# Patient Record
Sex: Female | Born: 1980 | Race: White | Hispanic: No | Marital: Married | Smoking: Never smoker
Health system: Southern US, Community
[De-identification: ages and names within clinical notes are randomized; demographics above are authoritative.]

## PROBLEM LIST (undated history)

## (undated) DIAGNOSIS — Z8719 Personal history of other diseases of the digestive system: Secondary | ICD-10-CM

## (undated) DIAGNOSIS — G43909 Migraine, unspecified, not intractable, without status migrainosus: Secondary | ICD-10-CM

## (undated) DIAGNOSIS — R112 Nausea with vomiting, unspecified: Secondary | ICD-10-CM

## (undated) DIAGNOSIS — I10 Essential (primary) hypertension: Secondary | ICD-10-CM

## (undated) DIAGNOSIS — R2231 Localized swelling, mass and lump, right upper limb: Secondary | ICD-10-CM

## (undated) DIAGNOSIS — Z9889 Other specified postprocedural states: Secondary | ICD-10-CM

## (undated) DIAGNOSIS — K219 Gastro-esophageal reflux disease without esophagitis: Secondary | ICD-10-CM

## (undated) HISTORY — PX: FINGER MASS EXCISION: SHX1638

## (undated) HISTORY — PX: TUBAL LIGATION: SHX77

---

## 2000-10-25 ENCOUNTER — Emergency Department (HOSPITAL_COMMUNITY): Admission: EM | Admit: 2000-10-25 | Discharge: 2000-10-25 | Payer: Self-pay | Admitting: Emergency Medicine

## 2001-12-09 ENCOUNTER — Emergency Department (HOSPITAL_COMMUNITY): Admission: EM | Admit: 2001-12-09 | Discharge: 2001-12-09 | Payer: Self-pay | Admitting: Emergency Medicine

## 2001-12-09 ENCOUNTER — Encounter: Payer: Self-pay | Admitting: Emergency Medicine

## 2002-08-13 ENCOUNTER — Emergency Department (HOSPITAL_COMMUNITY): Admission: EM | Admit: 2002-08-13 | Discharge: 2002-08-13 | Payer: Self-pay | Admitting: Emergency Medicine

## 2002-09-24 ENCOUNTER — Ambulatory Visit (HOSPITAL_COMMUNITY): Admission: AD | Admit: 2002-09-24 | Discharge: 2002-09-25 | Payer: Self-pay | Admitting: Obstetrics and Gynecology

## 2002-10-31 ENCOUNTER — Emergency Department (HOSPITAL_COMMUNITY): Admission: EM | Admit: 2002-10-31 | Discharge: 2002-10-31 | Payer: Self-pay | Admitting: Emergency Medicine

## 2002-11-06 ENCOUNTER — Observation Stay (HOSPITAL_COMMUNITY): Admission: EM | Admit: 2002-11-06 | Discharge: 2002-11-07 | Payer: Self-pay

## 2002-12-21 ENCOUNTER — Ambulatory Visit (HOSPITAL_COMMUNITY): Admission: AD | Admit: 2002-12-21 | Discharge: 2002-12-21 | Payer: Self-pay | Admitting: Obstetrics and Gynecology

## 2002-12-25 ENCOUNTER — Ambulatory Visit (HOSPITAL_COMMUNITY): Admission: AD | Admit: 2002-12-25 | Discharge: 2002-12-25 | Payer: Self-pay | Admitting: Obstetrics and Gynecology

## 2002-12-26 ENCOUNTER — Observation Stay (HOSPITAL_COMMUNITY): Admission: AD | Admit: 2002-12-26 | Discharge: 2002-12-26 | Payer: Self-pay | Admitting: Obstetrics and Gynecology

## 2002-12-30 ENCOUNTER — Ambulatory Visit (HOSPITAL_COMMUNITY): Admission: AD | Admit: 2002-12-30 | Discharge: 2002-12-30 | Payer: Self-pay | Admitting: Obstetrics and Gynecology

## 2003-01-03 ENCOUNTER — Observation Stay (HOSPITAL_COMMUNITY): Admission: AD | Admit: 2003-01-03 | Discharge: 2003-01-04 | Payer: Self-pay | Admitting: Obstetrics and Gynecology

## 2003-01-07 ENCOUNTER — Observation Stay (HOSPITAL_COMMUNITY): Admission: AD | Admit: 2003-01-07 | Discharge: 2003-01-08 | Payer: Self-pay | Admitting: Obstetrics and Gynecology

## 2003-01-21 ENCOUNTER — Ambulatory Visit (HOSPITAL_COMMUNITY): Admission: AD | Admit: 2003-01-21 | Discharge: 2003-01-21 | Payer: Self-pay | Admitting: Obstetrics and Gynecology

## 2003-02-07 ENCOUNTER — Inpatient Hospital Stay (HOSPITAL_COMMUNITY): Admission: RE | Admit: 2003-02-07 | Discharge: 2003-02-09 | Payer: Self-pay | Admitting: Obstetrics and Gynecology

## 2003-10-27 ENCOUNTER — Emergency Department (HOSPITAL_COMMUNITY): Admission: EM | Admit: 2003-10-27 | Discharge: 2003-10-27 | Payer: Self-pay | Admitting: Emergency Medicine

## 2004-12-04 ENCOUNTER — Emergency Department (HOSPITAL_COMMUNITY): Admission: EM | Admit: 2004-12-04 | Discharge: 2004-12-04 | Payer: Self-pay | Admitting: Family Medicine

## 2005-02-05 ENCOUNTER — Ambulatory Visit (HOSPITAL_COMMUNITY): Admission: RE | Admit: 2005-02-05 | Discharge: 2005-02-05 | Payer: Self-pay | Admitting: Family Medicine

## 2005-03-25 ENCOUNTER — Ambulatory Visit: Payer: Self-pay | Admitting: Internal Medicine

## 2005-04-06 ENCOUNTER — Ambulatory Visit: Payer: Self-pay | Admitting: Internal Medicine

## 2005-04-06 ENCOUNTER — Ambulatory Visit (HOSPITAL_COMMUNITY): Admission: RE | Admit: 2005-04-06 | Discharge: 2005-04-06 | Payer: Self-pay | Admitting: Internal Medicine

## 2005-06-09 ENCOUNTER — Emergency Department (HOSPITAL_COMMUNITY): Admission: EM | Admit: 2005-06-09 | Discharge: 2005-06-09 | Payer: Self-pay | Admitting: Family Medicine

## 2005-06-24 ENCOUNTER — Emergency Department (HOSPITAL_COMMUNITY): Admission: EM | Admit: 2005-06-24 | Discharge: 2005-06-24 | Payer: Self-pay | Admitting: Family Medicine

## 2005-09-24 ENCOUNTER — Ambulatory Visit: Payer: Self-pay | Admitting: Internal Medicine

## 2006-08-25 ENCOUNTER — Emergency Department (HOSPITAL_COMMUNITY): Admission: EM | Admit: 2006-08-25 | Discharge: 2006-08-25 | Payer: Self-pay | Admitting: Emergency Medicine

## 2006-12-29 ENCOUNTER — Emergency Department (HOSPITAL_COMMUNITY): Admission: EM | Admit: 2006-12-29 | Discharge: 2006-12-29 | Payer: Self-pay | Admitting: Emergency Medicine

## 2006-12-30 ENCOUNTER — Emergency Department (HOSPITAL_COMMUNITY): Admission: EM | Admit: 2006-12-30 | Discharge: 2006-12-30 | Payer: Self-pay | Admitting: Emergency Medicine

## 2008-09-23 ENCOUNTER — Ambulatory Visit (HOSPITAL_COMMUNITY): Admission: RE | Admit: 2008-09-23 | Discharge: 2008-09-23 | Payer: Self-pay | Admitting: Family Medicine

## 2008-09-25 ENCOUNTER — Encounter (INDEPENDENT_AMBULATORY_CARE_PROVIDER_SITE_OTHER): Payer: Self-pay | Admitting: *Deleted

## 2008-10-02 ENCOUNTER — Other Ambulatory Visit: Admission: RE | Admit: 2008-10-02 | Discharge: 2008-10-02 | Payer: Self-pay | Admitting: Obstetrics and Gynecology

## 2008-11-14 DIAGNOSIS — J45909 Unspecified asthma, uncomplicated: Secondary | ICD-10-CM | POA: Insufficient documentation

## 2008-11-14 DIAGNOSIS — K219 Gastro-esophageal reflux disease without esophagitis: Secondary | ICD-10-CM | POA: Insufficient documentation

## 2008-11-14 DIAGNOSIS — R1310 Dysphagia, unspecified: Secondary | ICD-10-CM | POA: Insufficient documentation

## 2008-12-25 ENCOUNTER — Emergency Department (HOSPITAL_COMMUNITY): Admission: EM | Admit: 2008-12-25 | Discharge: 2008-12-25 | Payer: Self-pay | Admitting: Emergency Medicine

## 2009-02-20 ENCOUNTER — Emergency Department (HOSPITAL_COMMUNITY): Admission: EM | Admit: 2009-02-20 | Discharge: 2009-02-20 | Payer: Self-pay | Admitting: Emergency Medicine

## 2009-10-25 ENCOUNTER — Emergency Department (HOSPITAL_COMMUNITY): Admission: EM | Admit: 2009-10-25 | Discharge: 2009-10-25 | Payer: Self-pay | Admitting: Emergency Medicine

## 2009-11-30 ENCOUNTER — Emergency Department (HOSPITAL_COMMUNITY): Admission: EM | Admit: 2009-11-30 | Discharge: 2009-11-30 | Payer: Self-pay | Admitting: Emergency Medicine

## 2010-05-18 ENCOUNTER — Emergency Department (HOSPITAL_COMMUNITY)
Admission: EM | Admit: 2010-05-18 | Discharge: 2010-05-18 | Disposition: A | Discharge status: Home / Self Care | Payer: Self-pay | Attending: Emergency Medicine | Admitting: Emergency Medicine

## 2010-05-18 DIAGNOSIS — F411 Generalized anxiety disorder: Secondary | ICD-10-CM | POA: Insufficient documentation

## 2010-05-18 DIAGNOSIS — J45909 Unspecified asthma, uncomplicated: Secondary | ICD-10-CM | POA: Insufficient documentation

## 2010-05-18 DIAGNOSIS — K219 Gastro-esophageal reflux disease without esophagitis: Secondary | ICD-10-CM | POA: Insufficient documentation

## 2010-05-18 DIAGNOSIS — M542 Cervicalgia: Secondary | ICD-10-CM | POA: Insufficient documentation

## 2010-06-05 LAB — POCT PREGNANCY, URINE: Preg Test, Ur: NEGATIVE

## 2010-08-04 NOTE — Consult Note (Signed)
[name], [name]               ACCOUNT NO.:  [id number]   MEDICAL RECORD NO.:  [id number]          PATIENT TYPE:  EMS   LOCATION:  ED                            FACILITY:  [facility]   PHYSICIAN:  J. Darreld Mclean, M.D. DATE OF BIRTH:  1980/04/10   DATE OF CONSULTATION:  12/30/2006  DATE OF DISCHARGE:  12/30/2006                                 CONSULTATION   I was asked to see the patient in the emergency room at the request of  the emergency room physician.   The patient has developed paronychia that has now turned into a felon to  the right ring finger on the ulnar aspect. She was seen in the ER here  last night. They did a small laceration of the paronychia but has now  developed into a felon. It is painful. It is tender. She was not given  antibiotics last night.   She has swelling and tenderness on the ulnar aspect of the right ring  finger distally. It is very swollen, very painful.   One percent Xylocaine block was given, and then prep and drape was done.  Incision was made on the ulnar aspect of the ring finger, and some  yellow purulent material was obtained. Cultures were obtained. I spread  open the deep layers down to the bone with a Hemostat. Wound was  irrigated with peroxide and then a large bulky dressing applied.  Prescription given for Darvocet-N 100 as she is allergic to the codeine  and hydrocodone given last night and prescription for Keflex 500 given.  I will see in the office Monday morning, keep the dressing dry, keep it  on. 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  If any difficulty, she is to let me know, and I will see her on Monday.  Return to the emergency room for any problems.           ______________________________  Shela Commons. Darreld Mclean, M.D.     JWK/MEDQ  D:  12/30/2006  T:  12/31/2006  Job:  [id number]
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
## 2010-08-07 NOTE — Op Note (Signed)
[name], [name]                         ACCOUNT NO.:  [id number]   MEDICAL RECORD NO.:  [id number]                   PATIENT TYPE:  INP   LOCATION:  A419                                 FACILITY:  [facility]   PHYSICIAN:  Tilda Burrow, M.D.              DATE OF BIRTH:  1980/08/28   DATE OF PROCEDURE:  02/07/2003  DATE OF DISCHARGE:                                 OPERATIVE REPORT   PREOPERATIVE DIAGNOSES:  1. Pregnancy [redacted] weeks gestation prior cesarean section, not for trial of     labor.  2. Desire for elective permanent sterilization.   POSTOPERATIVE DIAGNOSES:  1. Pregnancy [redacted] weeks gestation prior cesarean section, not for trial of     labor, delivered.  2. Desire for elective permanent sterilization.   PROCEDURE:  Primary low transverse cervical cesarean section.   SURGEON:  Tilda Burrow, M.D.   ASSISTANTSharlot Gowda, CST.   ANESTHESIA:  Spinal.   COMPLICATIONS:  None.   FINDINGS:  A health 5 pound 7 ounce female infant, Apgars 8 and 9.   PEDIATRICIAN:  Cared for by Dr.  Jeoffrey Massed.   INDICATIONS:  A 30 year old gravida 2, para 1 who has 3 stepchildren as well  who requests cesarean delivery having had arrest of labor with first child.  Issues of permanent sterilization were discussed at length on several  occasions.   DETAILS OF PROCEDURE:  The patient was taken to the operating room and  spinal anesthesia introduced and then Foley catheter inserted.  Abdomen  prepped and draped.  Pfannenstiel-type incision was repeated with excision  of the old cicatrix.  The fascia was opened transversely.  The rectus muscle  split in the midline easily and peritoneal cavity entered.  There were no  bladder flap adhesions.   Bladder flap was easily developed, the transverse uterine incision made with  a knife blade until the amniotic fluid was encountered with a transverse  extension of the incision performed with index finger traction; followed by  manual guidance  of the vertex out with fundal pressure applied to expel the  infant.  The cord was clamped.  The infant was passed to the awaiting  pediatrician.   Cord blood samples were obtained and are pending in the chart somewhere  else. Placenta delivered by [facility] presentation, intact; membranes  accompanying it.  The uterus was irrigated with antibiotic solution, closed  in a single layer of running locking closure of #0 chromic followed by 2-0  chromic closure of the peritoneal cavity. A single extra suture of 2-0  chromic was required at the left end of the uterine incision to complete  hemostasis.   TUBAL LIGATION:  Tubal ligation was performed by grasping each tube at its  midportion, identifying it to the fimbriated and then ligating around the  incarcerated knuckle of tube, excising a specimen from the knuckle of tube  and confirming hemostasis.  The opposite tube was treated similarly.   The abdomen was closed using continuous running 2-0 chromic for the fascia.  The rectus muscle was reapproximated over the bladder with 2-0 chromic, the  fascia pulled together with continuous running #0 Vicryl and staple closure  of the skin after 2-0 plain reapproximation of skin edges.  The patient  tolerated the procedure well and went to recovery room in good condition.  Vital signs stable with 400 cc of EBL.      ___________________________________________                                            Tilda Burrow, M.D.   JVF/MEDQ  D:  02/07/2003  T:  02/07/2003  Job:  [id number]   cc:   Jeoffrey Massed, M.D.  [facility]  [place], [place] [address]  Fax: [phone]

## 2010-08-07 NOTE — H&P (Signed)
NAME:  [name], CORBO                         ACCOUNT NO.:  [id number]   MEDICAL RECORD NO.:  [id number]                   PATIENT TYPE:  AMB   LOCATION:  DAY                                  FACILITY:  [facility]   PHYSICIAN:  Tilda Burrow, M.D.              DATE OF BIRTH:  1980/07/15   DATE OF ADMISSION:  DATE OF DISCHARGE:                                HISTORY & PHYSICAL   ADMITTING DIAGNOSIS:  Pregnancy, 38-1/[redacted] weeks gestation, prior cesarean  section, now for trial of labor, desire for elective permanent  sterilization.   HISTORY OF PRESENT ILLNESS:  This 30 year old female, gravida 2, para 1, AB  0, LMP 05/14/2002, __________ 02/19/2003 with corresponding first trimester  and second trimester ultrasounds matching exactly to the date.  Is admitted  after pregnancy course, followed through our office through 22 prenatal  visits, but relatively uneventful.  The patient had several prenatal visits  for preterm labor in the early third trimester but did not show changes in  her cervix.  She furthermore has been counseled over the permanency of the  requested permanent sterilization.  Signed tubal ligation papers 12/05/2002.  Confirms her unwavering desire for permanent sterilization.  Her young age  and the potential for changing her mind later have been addressed, and she  is quite confident that her decision is strong and unwavering.  Failure  rates of 1-2 per 100 have been quoted to the patient for cesarean section  and tubal ligation at the same time.   PAST MEDICAL HISTORY:  Asthma, not requiring medications this pregnancy.   SURGICAL HISTORY:  Low transverse cervical cesarean section after arrest of  labor at 3 cm dilated with a narrow pelvis, occiput posterior presentation  in 1999.   ALLERGIES:  None.   SOCIAL HISTORY:  Married, separated.  Works at [employer].  Again,  the social aspects of her desire for sterilization have been addressed at  more than one  occasion, and she is strongly fixed in her opinion for  permanent sterilization.   PHYSICAL EXAMINATION:  VITAL SIGNS:  Height 5'2, weight 141, blood pressure  110/60.  GENERAL:  General exam shows a healthy-appearing female, alert and oriented  x3.  HEENT:  Pupils are equal, round, and reactive.  Extraocular movements are  intact.  NECK:  Supple.  Trachea midline.  CHEST:  Clear to auscultation.  NECK:  Supple.  ABDOMEN:  Term-size fetus, vertex presentation.  PELVIC:  Cervix closed, posterior, firm, and high at last check.   PLAN:  Repeat cesarean section, tubal ligation named 02/07/2003.     ___________________________________________                                         Tilda Burrow, M.D.   JVF/MEDQ  D:  02/04/2003  T:  02/04/2003  Job:  [id number]   cc:   Francoise Schaumann. Halm, D.O.  [address], [address]  [place]  [place] [address]  Fax: [phone]

## 2010-08-07 NOTE — Op Note (Signed)
[name], [name]               ACCOUNT NO.:  [id number]   MEDICAL RECORD NO.:  [id number]          PATIENT TYPE:  AMB   LOCATION:  DAY                           FACILITY:  [facility]   PHYSICIAN:  R. Roetta Sessions, M.D. DATE OF BIRTH:  03-29-1980   DATE OF PROCEDURE:  04/06/2005  DATE OF DISCHARGE:                                 OPERATIVE REPORT   PROCEDURE:  Diagnostic esophagogastroduodenoscopy.   INDICATIONS FOR PROCEDURE:  The patient is a 29 year old lady with a good  two to three year history of prominent typical symptoms of gastroesophageal  reflux described as heartburn ____________ worsening over the past several  weeks, and she also reports some dysphagia. Barium pill esophagogram  February 05, 2005 demonstrated no abnormalities. Barium pill rapidly  traversed the esophagus. She was started on Zegerid 40 mg orally daily on  March 25, 2005. This has been associated with a significantly improved  control of gastroesophageal reflux symptoms in contrast to taking Protonix  40 mg orally b.i.d. EGD is now being done. This approach has been discussed  with the patient at length. Potential risks, benefits, and alternatives have  been reviewed and questions answered. She is agreeable. Please see  documentation in the medical record.   PROCEDURE NOTE:  O2 saturation, blood pressure, pulse, and respirations were  monitored throughout the entire procedure. Conscious sedation with Versed 3  mg IV and Demerol 75 mg IV in divided doses.   INSTRUMENT:  Olympus video chip system.   FINDINGS:  Examination of the tubular esophagus revealed circumferential  erosions at the EG junction. There was no evidence of pill-induced injury,  Barrett's esophagus or neoplasm. There was no evidence of ring, stricture or  web. EG junction was wide open and easily traversed.   Stomach:  Gastric cavity was empty and insufflated well with air. Thorough  examination of gastric mucosa including retroflexed  view of the proximal  stomach and esophagogastric junction demonstrated only small hiatal hernia.  Pylorus patent and easily traversed. Examination of bulb and second portion  revealed no abnormalities.   THERAPEUTIC/DIAGNOSTIC MANEUVERS:  None.   The patient tolerated the procedure well and was reactive to endoscopy.   IMPRESSION:  Patulous esophagogastric junction. Circumferential distal  esophageal erosions. Otherwise normal esophagus. Small hiatal hernia.  Otherwise normal stomach, D1 and D2.   RECOMMENDATIONS:  1.  Increase Zegerid to 40 mg orally twice daily. Anti-reflux literature      provided to [name].  2.  I will see this nice lady back in six weeks and see how she is doing.      Jonathon Bellows, M.D.  Electronically Signed     RMR/MEDQ  D:  04/06/2005  T:  04/06/2005  Job:  [id number]   cc:   Angus G. Renard Matter, MD  Fax: [phone]

## 2010-08-07 NOTE — Consult Note (Signed)
[name], [name]               ACCOUNT NO.:  [id number]   MEDICAL RECORD NO.:  [id number]          PATIENT TYPE:  AMB   LOCATION:                                FACILITY:  [facility]   PHYSICIAN:  R. Roetta Sessions, M.D. DATE OF BIRTH:  12/27/1980   DATE OF CONSULTATION:  03/25/2005  DATE OF DISCHARGE:                                   CONSULTATION   REFERRING PHYSICIAN:  Angus G. Renard Matter, M.D.   REASON FOR CONSULTATION:  Acid reflux and dysphagia.   HISTORY OF PRESENT ILLNESS:  [name] is a 30 year old, Caucasian female  patient of Dr. Renard Matter who presents today for further evaluation of  dysphagia and acid reflux.  She has had severe heartburn for the past  several months.  She was tried on Protonix 40 mg daily initially, but this  did not help.  She then was told to increase to 80 mg every morning and has  done this for 2-3 months with no improvement.  She also complains of  problems swallowing.  She feels like the food is not going down well.  She  had a barium esophagogram which was unremarkable.  She denies any nausea,  vomiting, abdominal pain, melena, rectal bleeding, constipation or diarrhea.   CURRENT MEDICATIONS:  1.  Protonix 80 mg daily.  2.  Albuterol inhaler two puffs t.i.d. p.r.n.  3.  Ativan 0.5 mg daily p.r.n.   ALLERGIES:  CODEINE.   PAST MEDICAL HISTORY:  Asthma.   PAST SURGICAL HISTORY:  1.  Cesarean section x2.  2.  Tubal ligation.   FAMILY HISTORY:  Her mother has acid reflux.  Her father has a bleeding  ulcer possibly due to alcohol consumption.  She has a cousin with colon  cancer.   SOCIAL HISTORY:  She is divorced and has two children.  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  She is a nonsmoker.  No alcohol use.   REVIEW OF SYSTEMS:  GASTROINTESTINAL:  See HPI.  CONSTITUTIONAL:  No weight  loss.  CARDIOPULMONARY:  No shortness of breath.  Recently, asthma that is  well-controlled.  No chest pain.   PHYSICAL EXAMINATION:  VITAL SIGNS:  Weight  129, height 5 feet 1 inch.  Temperature 97.9, blood pressure 120/72, pulse 60.  GENERAL:  Pleasant, well-developed, well-nourished, Caucasian female in no  acute distress.  SKIN:  Warm and dry, no jaundice.  HEENT:  Pupils equal round and reactive to light.  Conjunctivae are pink.  Sclerae nonicteric.  Oropharyngeal mucosa moist and pink.  No lesions  erythema or exudate.  No lymphadenopathy or thyromegaly.  CHEST:  Lungs clear to auscultation  CARDIAC:  Regular rate and rhythm with normal S1, S2, no murmurs, rubs or  gallops.  ABDOMEN:  Positive bowel sounds, soft, nontender, nondistended.  No  organomegaly or masses.  No rebound tenderness or guarding.  No abdominal  bruits or hernias.  EXTREMITIES:  No edema.   IMPRESSION:  [name] is a 30 year old lady with several month history of  refractory acid reflux symptoms.  She also complains of dysphagia to solid  foods.  Barium pill esophagogram was unremarkable.  Sensation and dysphagia  may be due to poorly-controlled reflux.  Unfortunately, she has not  responded to Protonix.   RECOMMENDATIONS:  1.  EGD for further evaluation of her symptoms.  I discussed risks,      alternatives and benefits with regards to the risk of reaction of      medication, bleeding, infection, perforation and the patient is      agreeable to proceed.  2.  Trial of Xigris 40 mg p.o. daily x1 week.  If not better at that point,      may increase to b.i.d.  3.  Samples given.  4.  Antireflux measures.   I would like to thank Dr. Renard Matter for allowing [place] to take part in the care  of this patient.      Tana Coast, P.AJonathon Bellows, M.D.  Electronically Signed    LL/MEDQ  D:  03/25/2005  T:  03/25/2005  Job:  [id number]

## 2010-08-07 NOTE — Discharge Summary (Signed)
NAMEERRYN, [name]                         ACCOUNT NO.:  [id number]   MEDICAL RECORD NO.:  [id number]                   PATIENT TYPE:  INP   LOCATION:  A419                                 FACILITY:  [facility]   PHYSICIAN:  Tilda Burrow, M.D.              DATE OF BIRTH:  Jan 09, 1981   DATE OF ADMISSION:  02/07/2003  DATE OF DISCHARGE:  02/09/2003                                 DISCHARGE SUMMARY   ADMITTING DIAGNOSES:  1. Pregnancy at 38-1/[redacted] weeks gestation.  2. Prior cesarean section not for trial of labor.  3. Desire for permanent sterilization.   DISCHARGE DIAGNOSES:  1. Pregnancy at 38-1/[redacted] weeks gestation delivered.  2. Prior cesarean section not for trial of labor.  3. Elective permanent sterilization.   PROCEDURE:  Repeat low transverse cervical cesarean section and bilateral  partial salpingectomy performed on February 07, 2003, Jannifer Franklin, M.D.   DISCHARGE MEDICATION:  Tylox one to two q.4h. p.r.n. dispense 15.   HOSPITAL SUMMARY:  This 30 year old female was admitted at 38-1/[redacted] weeks  gestation for repeat cesarean section and tubal ligation as described in the  admitting history.  The patient underwent low transverse cervical cesarean  section and bilateral tubal ligation on February 07, 2003 as described in  medical record [id number].  She then delivered, the infant was a 5-pound 7-ounce  female, Apgars 9/9, with 600 mL estimated blood loss.  Postoperatively the  patient did well, was discharged on February 09, 2003, postop day #2, in  stable condition with postoperative hemoglobin 10.8, hematocrit 31.7  compared to admitting hemoglobin 13.3, hematocrit 37.8.  The blood gas on  the infant was pH 7.323, pCO2 54.5, pO2 14.7 on an arterial sample.  The  patient did well postpartum and went home for followup in 1 week for staple  removal.     ___________________________________________                                         Tilda Burrow, M.D.   JVF/MEDQ  D:   02/24/2003  T:  02/24/2003  Job:  [id number]

## 2010-08-11 ENCOUNTER — Emergency Department (HOSPITAL_COMMUNITY)
Admission: EM | Admit: 2010-08-11 | Discharge: 2010-08-11 | Disposition: A | Discharge status: Home / Self Care | Payer: Self-pay | Attending: Emergency Medicine | Admitting: Emergency Medicine

## 2010-08-11 DIAGNOSIS — K269 Duodenal ulcer, unspecified as acute or chronic, without hemorrhage or perforation: Secondary | ICD-10-CM | POA: Insufficient documentation

## 2010-08-11 DIAGNOSIS — IMO0002 Reserved for concepts with insufficient information to code with codable children: Secondary | ICD-10-CM | POA: Insufficient documentation

## 2010-08-11 DIAGNOSIS — T18108A Unspecified foreign body in esophagus causing other injury, initial encounter: Secondary | ICD-10-CM | POA: Insufficient documentation

## 2010-08-11 DIAGNOSIS — K208 Other esophagitis without bleeding: Secondary | ICD-10-CM | POA: Insufficient documentation

## 2010-08-12 LAB — CLOTEST (H. PYLORI), BIOPSY: Helicobacter screen: NEGATIVE

## 2010-08-15 NOTE — Op Note (Signed)
  [name], [name]               ACCOUNT NO.:  [id number]  MEDICAL RECORD NO.:  [id number]           PATIENT TYPE:  E  LOCATION:  WLED                         FACILITY:  [facility]  PHYSICIAN:  Khing Belcher L. Malon Kindle., M.D.DATE OF BIRTH:  [birth year]  DATE OF PROCEDURE:  08/11/2010 DATE OF DISCHARGE:  08/11/2010                              OPERATIVE REPORT   PROCEDURE:  Esophagogastroduodenoscopy and biopsy.  MEDICATIONS:  Cetacaine spray, fentanyl 50 mcg, and Versed 5 mg IV.  INDICATIONS:  The patient has previously been followed by gastroenterologist in [place] and has had previous esophageal obstructions and reflux.  She has required previous esophageal dilatations.  She was eating chicken last night, has been unable to swallow, soon she drank liquid, and this came back up and she has also spit up blood.  She has been on Nexium, but has not been able to afford, has been taking Mylanta for reflux.  DESCRIPTION OF PROCEDURE:  Procedure explained to the patient and consent obtained.  Plans were made to remove food impaction.  The Pentax upper scope was inserted with agglutination and advanced into the esophagus.  Upon pulling air into the esophagus, the liquid disappeared and we arrived at the distal esophagus and it was patent and there was no impaction, but there was bloody ulceration.  We saw a stricture in the hiatal hernia and passed distally into the stomach which a large bolus of chicken was seen lying in the stomach.  The stomach was suctioned out and no ulcerations or other abnormalities were seen.  The scope was passed into the duodenum and a small shallow duodenal ulcer was seen in the apex of the duodenal bulb and second duodenum was normal.  The scope was withdrawn and a biopsy was taken of the antrum for rapid urease test for helicobacter.  The scope was then withdrawn back into the esophagus and again some fairly severe ulceration of the distal esophagus and esophageal  stricture were noted.  There was no active bleeding.  The scope was withdrawn.  The patient tolerated the procedure well and was resting comfortably at the termination of the procedure.  ASSESSMENT: 1. Severe ulcerative esophagitis. 2. Duodenal ulcer.  PLAN:  We will check the results of CLO-test and we will start the patient on omeprazole 20 mg b.i.d. and a soft diet.  We will have her follow up in the office in 6 weeks and we will discuss dilatation with her if she still having problems.          ______________________________ Llana Aliment Malon Kindle., M.D.    Waldron Session  D:  08/11/2010  T:  08/12/2010  Job:  [id number]  Electronically Signed by Carman Ching M.D. on 08/15/2010 02:41:20 PM

## 2010-08-15 NOTE — Consult Note (Signed)
  [name], [name]               ACCOUNT NO.:  [id number]  MEDICAL RECORD NO.:  [id number]           PATIENT TYPE:  E  LOCATION:  WLED                         FACILITY:  [facility]  PHYSICIAN:  Consepcion Utt L. Malon Kindle., M.D.DATE OF BIRTH:  [birth year]  DATE OF CONSULTATION:  08/11/2010 DATE OF DISCHARGE:  08/11/2010                                CONSULTATION   REFERRING PHYSICIAN:  Angus G. McInnis, MD  REASON FOR REFERRAL:  Obstructive esophagus.  HISTORY:  The patient is a 30 year old female who has had previous esophageal reflux and apparent hiatal hernia followed by Dr. Marcy Salvo up in [place].  She has had previous food impactions and dilations, previously was treated with Nexium, but stopped taking Nexium due to cost considerations.  She has had a lot of heartburn and indigestion, has been taking lot of Maalox and Mylanta.  Yesterday, she was eating chicken and it became stuck and she has regurgitated up saliva liquids and even some blood since last yesterday evening.  She presented to the ER with these symptoms.  She has not had any fever or chills.  She states that this has happened before and would always tend to pass after an hour or 2.  Between these spells, she was able to take solids and liquids without much trouble.  ALLERGIES:  She has no drug allergies.  PAST MEDICAL HISTORY:  Esophageal reflux with a history of stricture. No other chronic medical problems.  CURRENT MEDICATIONS:  Tums  p.r.n.  No chronic medicines.  PHYSICAL EXAMINATION:  VITAL SIGNS:  Temperature 98, blood pressure 97/58, pulse 70. GENERAL:  Alert, nonicteric white female in no acute distress. NECK:  Supple.  No lymphadenopathy. LUNGS:  Clear. HEART:  Regular rate and rhythm without murmurs or gallops. ABDOMEN:  Soft and nontender.  ASSESSMENT:  Obstructive esophagus.  PLAN:  We will proceed at this point with EGD and removal of food impaction.  I have discussed the procedure again with the  patient including the risk of bleeding and perforation.  She has had it done before and does understand the procedure.          ______________________________ Llana Aliment. Malon Kindle., M.D.     Waldron Session  D:  08/11/2010  T:  08/12/2010  Job:  [id number]  cc:   Angus G. Renard Matter, MD Fax: [phone]  Electronically Signed by Carman Ching M.D. on 08/15/2010 02:41:12 PM

## 2010-10-21 ENCOUNTER — Inpatient Hospital Stay (HOSPITAL_COMMUNITY)
Admission: EM | Admit: 2010-10-21 | Discharge: 2010-10-24 | DRG: 391 | Disposition: A | Discharge status: Home / Self Care | Payer: Medicaid Other | Attending: Gastroenterology | Admitting: Gastroenterology

## 2010-10-21 DIAGNOSIS — K222 Esophageal obstruction: Secondary | ICD-10-CM | POA: Diagnosis present

## 2010-10-21 DIAGNOSIS — K449 Diaphragmatic hernia without obstruction or gangrene: Secondary | ICD-10-CM | POA: Diagnosis present

## 2010-10-21 DIAGNOSIS — R51 Headache: Secondary | ICD-10-CM | POA: Diagnosis not present

## 2010-10-21 DIAGNOSIS — R1310 Dysphagia, unspecified: Secondary | ICD-10-CM | POA: Diagnosis present

## 2010-10-21 DIAGNOSIS — K2289 Other specified disease of esophagus: Secondary | ICD-10-CM | POA: Diagnosis present

## 2010-10-21 DIAGNOSIS — K208 Other esophagitis without bleeding: Principal | ICD-10-CM | POA: Diagnosis present

## 2010-10-21 DIAGNOSIS — K219 Gastro-esophageal reflux disease without esophagitis: Secondary | ICD-10-CM | POA: Diagnosis present

## 2010-10-21 DIAGNOSIS — K228 Other specified diseases of esophagus: Secondary | ICD-10-CM | POA: Diagnosis present

## 2010-10-21 DIAGNOSIS — J45909 Unspecified asthma, uncomplicated: Secondary | ICD-10-CM | POA: Diagnosis present

## 2010-10-22 LAB — BASIC METABOLIC PANEL
BUN: 9 mg/dL (ref 6–23)
CO2: 26 mEq/L (ref 19–32)
Calcium: 8.7 mg/dL (ref 8.4–10.5)
Chloride: 105 mEq/L (ref 96–112)
Creatinine, Ser: 0.52 mg/dL (ref 0.50–1.10)
GFR calc Af Amer: >60 mL/min (ref >60)
GFR calc non Af Amer: >60 mL/min (ref >60)
Glucose, Bld: 83 mg/dL (ref 70–99)
Potassium: 3.7 mEq/L (ref 3.5–5.1)
Sodium: 135 mEq/L (ref 135–145)

## 2010-10-22 LAB — POCT I-STAT, CHEM 8
BUN: 8 mg/dL (ref 6–23)
Calcium, Ion: 1.18 mmol/L (ref 1.12–1.32)
Creatinine, Ser: 0.7 mg/dL (ref 0.50–1.10)
Glucose, Bld: 94 mg/dL (ref 70–99)
HCT: 42 % (ref 36.0–46.0)
Potassium: 3.4 mEq/L — ABNORMAL LOW (ref 3.5–5.1)
Sodium: 142 mEq/L (ref 135–145)
TCO2: 25 mmol/L (ref 0–100)

## 2010-10-22 LAB — CBC
HCT: 36 % (ref 36.0–46.0)
HCT: 39.3 % (ref 36.0–46.0)
Hemoglobin: 12 g/dL (ref 12.0–15.0)
Hemoglobin: 12.3 g/dL (ref 12.0–15.0)
Hemoglobin: 13.1 g/dL (ref 12.0–15.0)
MCH: 29.2 pg (ref 26.0–34.0)
MCH: 30.9 pg (ref 26.0–34.0)
MCHC: 33.3 g/dL (ref 30.0–36.0)
MCHC: 34.2 g/dL (ref 30.0–36.0)
MCHC: 35.2 g/dL (ref 30.0–36.0)
MCV: 87.7 fL (ref 78.0–100.0)
MCV: 87.9 fL (ref 78.0–100.0)
Platelets: 192 10*3/uL (ref 150–400)
RBC: 4.06 MIL/uL (ref 3.87–5.11)
RBC: 4.48 MIL/uL (ref 3.87–5.11)
RDW: 13.2 % (ref 11.5–15.5)
RDW: 13 % (ref 11.5–15.5)
WBC: 4.7 10*3/uL (ref 4.0–10.5)
WBC: 5.4 10*3/uL (ref 4.0–10.5)

## 2010-10-22 LAB — DIFFERENTIAL
Basophils Absolute: 0 10*3/uL (ref 0.0–0.1)
Basophils Relative: 1 % (ref 0–1)
Eosinophils Relative: 5 % (ref 0–5)
Lymphocytes Relative: 26 % (ref 12–46)
Lymphs Abs: 1.4 10*3/uL (ref 0.7–4.0)
Monocytes Absolute: 0.5 10*3/uL (ref 0.1–1.0)
Monocytes Relative: 9 % (ref 3–12)
Neutro Abs: 3.2 10*3/uL (ref 1.7–7.7)
Neutrophils Relative %: 59 % (ref 43–77)

## 2010-10-23 LAB — CBC
HCT: 35.3 % — ABNORMAL LOW (ref 36.0–46.0)
Hemoglobin: 12 g/dL (ref 12.0–15.0)
MCH: 30.3 pg (ref 26.0–34.0)
MCV: 89.1 fL (ref 78.0–100.0)
Platelets: 187 10*3/uL (ref 150–400)
RBC: 3.96 MIL/uL (ref 3.87–5.11)
RDW: 12.7 % (ref 11.5–15.5)
WBC: 3.4 10*3/uL — ABNORMAL LOW (ref 4.0–10.5)

## 2010-10-23 LAB — BASIC METABOLIC PANEL
BUN: 8 mg/dL (ref 6–23)
CO2: 24 mEq/L (ref 19–32)
Calcium: 8.3 mg/dL — ABNORMAL LOW (ref 8.4–10.5)
Creatinine, Ser: <0.47 mg/dL — ABNORMAL LOW (ref 0.50–1.10)
Glucose, Bld: 80 mg/dL (ref 70–99)
Potassium: 3.6 mEq/L (ref 3.5–5.1)
Sodium: 137 mEq/L (ref 135–145)

## 2010-10-24 LAB — CBC
HCT: 35.1 % — ABNORMAL LOW (ref 36.0–46.0)
HCT: 35.7 % — ABNORMAL LOW (ref 36.0–46.0)
Hemoglobin: 11.8 g/dL — ABNORMAL LOW (ref 12.0–15.0)
Hemoglobin: 11.8 g/dL — ABNORMAL LOW (ref 12.0–15.0)
MCH: 29.3 pg (ref 26.0–34.0)
MCH: 29.9 pg (ref 26.0–34.0)
MCHC: 33.1 g/dL (ref 30.0–36.0)
MCV: 88.6 fL (ref 78.0–100.0)
MCV: 88.9 fL (ref 78.0–100.0)
Platelets: 205 10*3/uL (ref 150–400)
Platelets: 220 10*3/uL (ref 150–400)
RBC: 3.95 MIL/uL (ref 3.87–5.11)
RBC: 4.03 MIL/uL (ref 3.87–5.11)
RDW: 12.6 % (ref 11.5–15.5)
RDW: 12.6 % (ref 11.5–15.5)
WBC: 3.2 10*3/uL — ABNORMAL LOW (ref 4.0–10.5)
WBC: 3.3 10*3/uL — ABNORMAL LOW (ref 4.0–10.5)

## 2010-10-24 LAB — COMPREHENSIVE METABOLIC PANEL
ALT: 12 U/L (ref 0–35)
Albumin: 2.9 g/dL — ABNORMAL LOW (ref 3.5–5.2)
Alkaline Phosphatase: 41 U/L (ref 39–117)
CO2: 21 mEq/L (ref 19–32)
Calcium: 8.1 mg/dL — ABNORMAL LOW (ref 8.4–10.5)
GFR calc Af Amer: >60 mL/min (ref >60)
GFR calc non Af Amer: >60 mL/min (ref >60)
Glucose, Bld: 89 mg/dL (ref 70–99)
Sodium: 139 mEq/L (ref 135–145)
Total Bilirubin: 0.3 mg/dL (ref 0.3–1.2)

## 2010-10-26 IMAGING — CR DG SINUSES COMPLETE 3+V
4 series · 4 of 4 positions shown · non-contrast
Comparison: None available.

CLINICAL DATA: Chronic headache.

PARANASAL SINUSES - 1-2 VIEW

[view not recorded (1 of 4)]
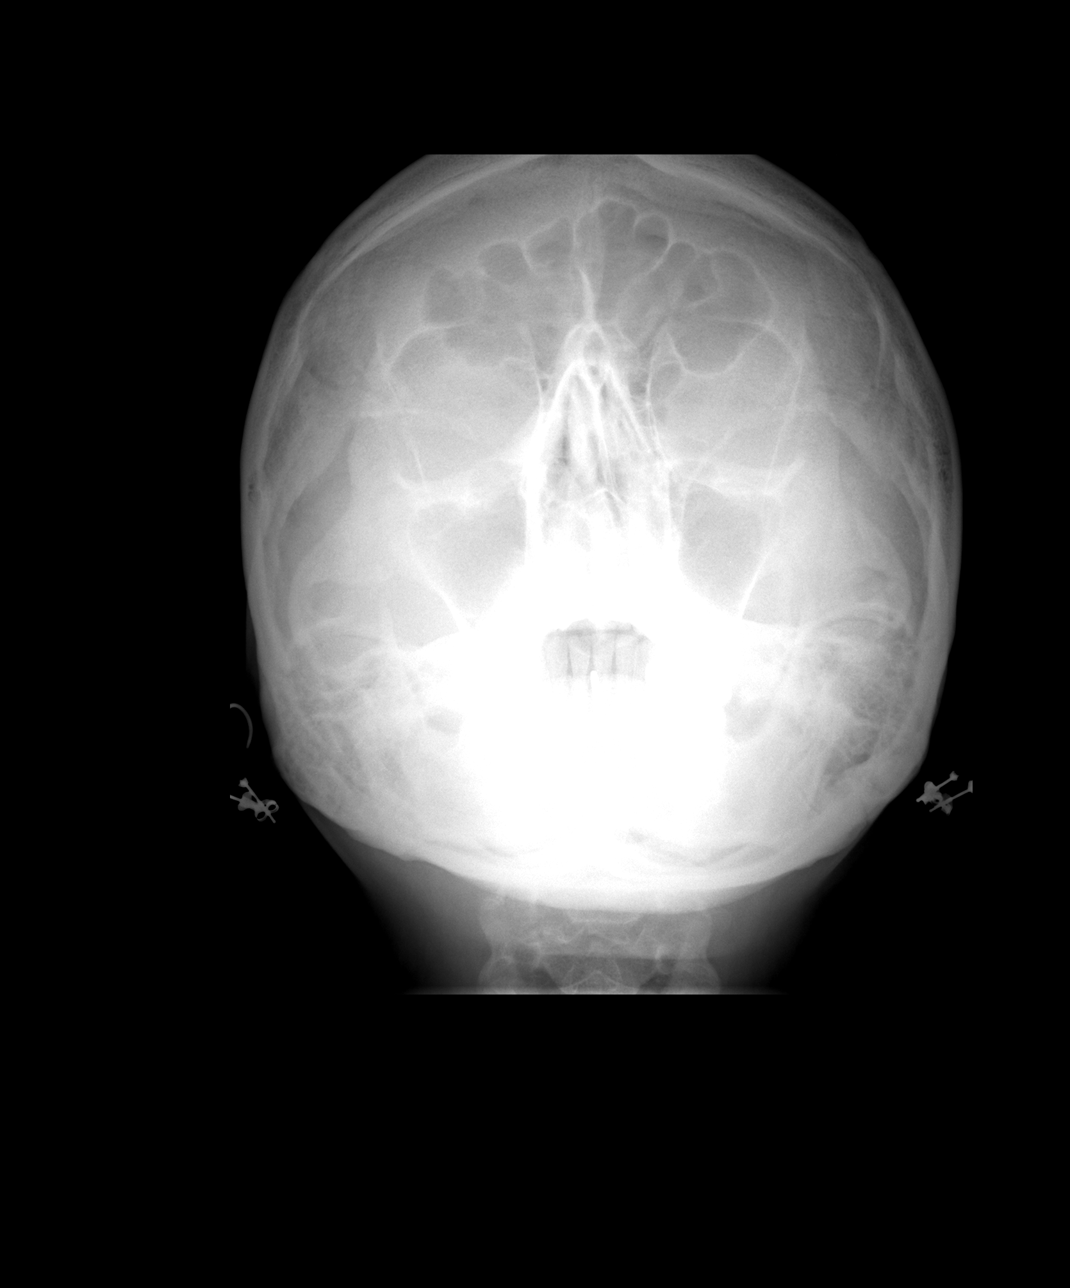

[view not recorded (2 of 4)]
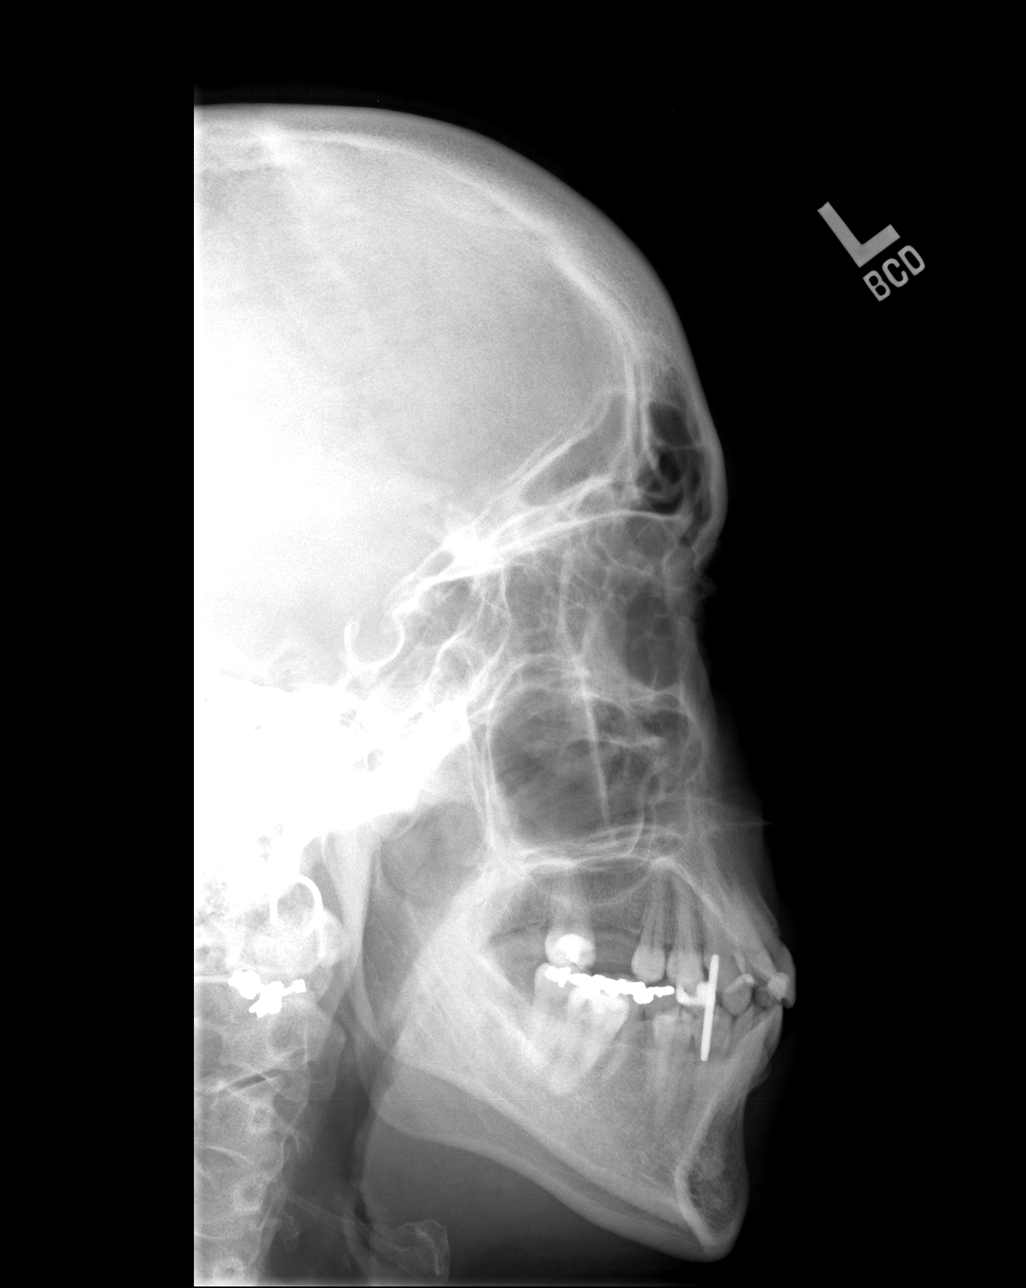

[view not recorded (3 of 4)]
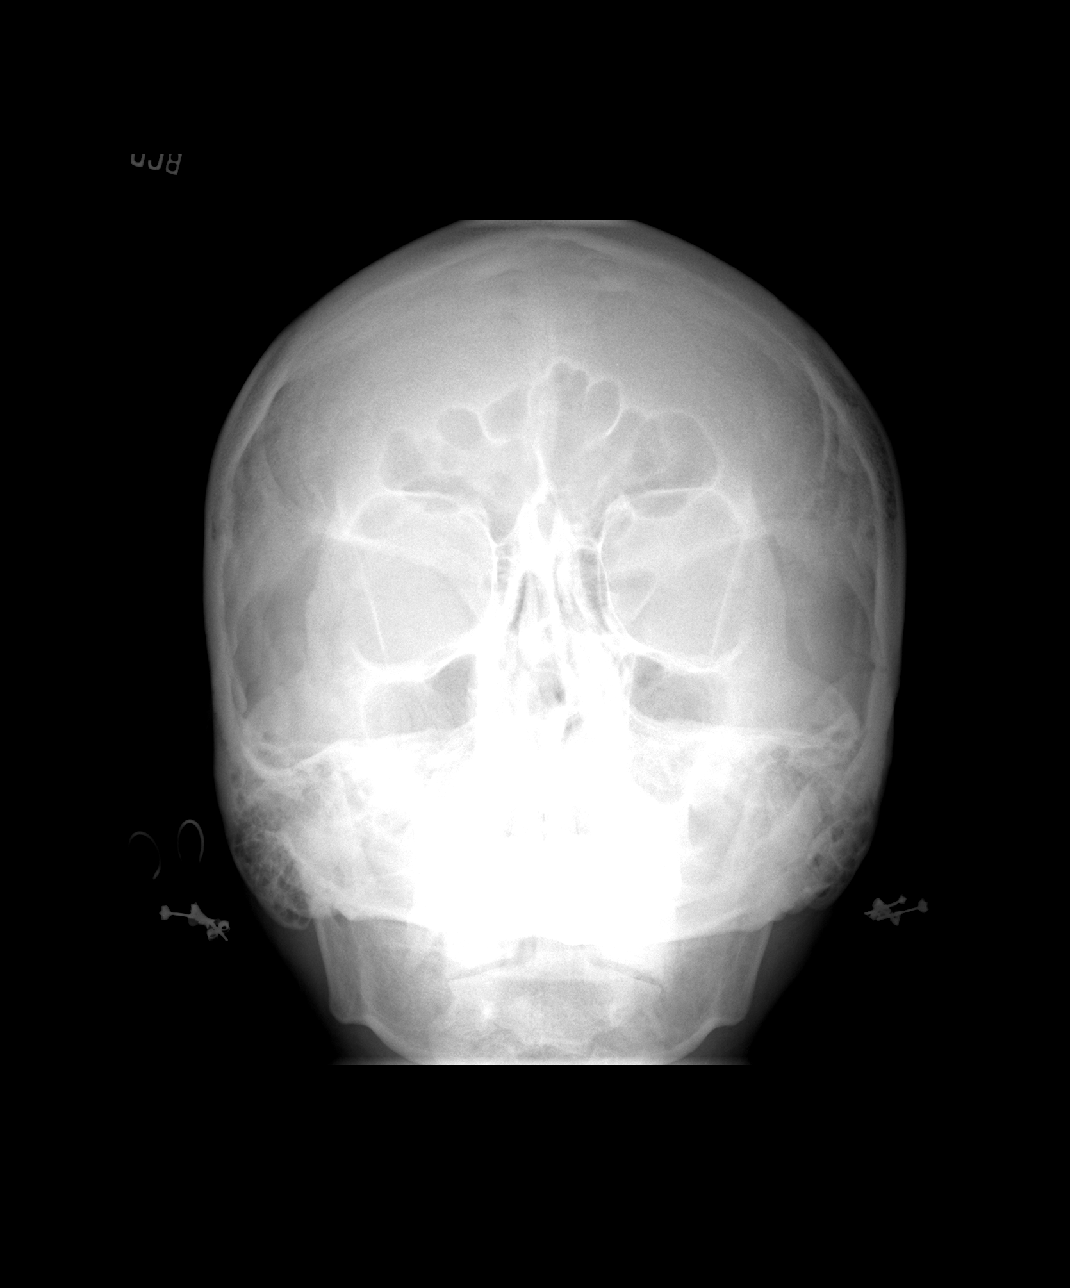

[view not recorded (4 of 4)]
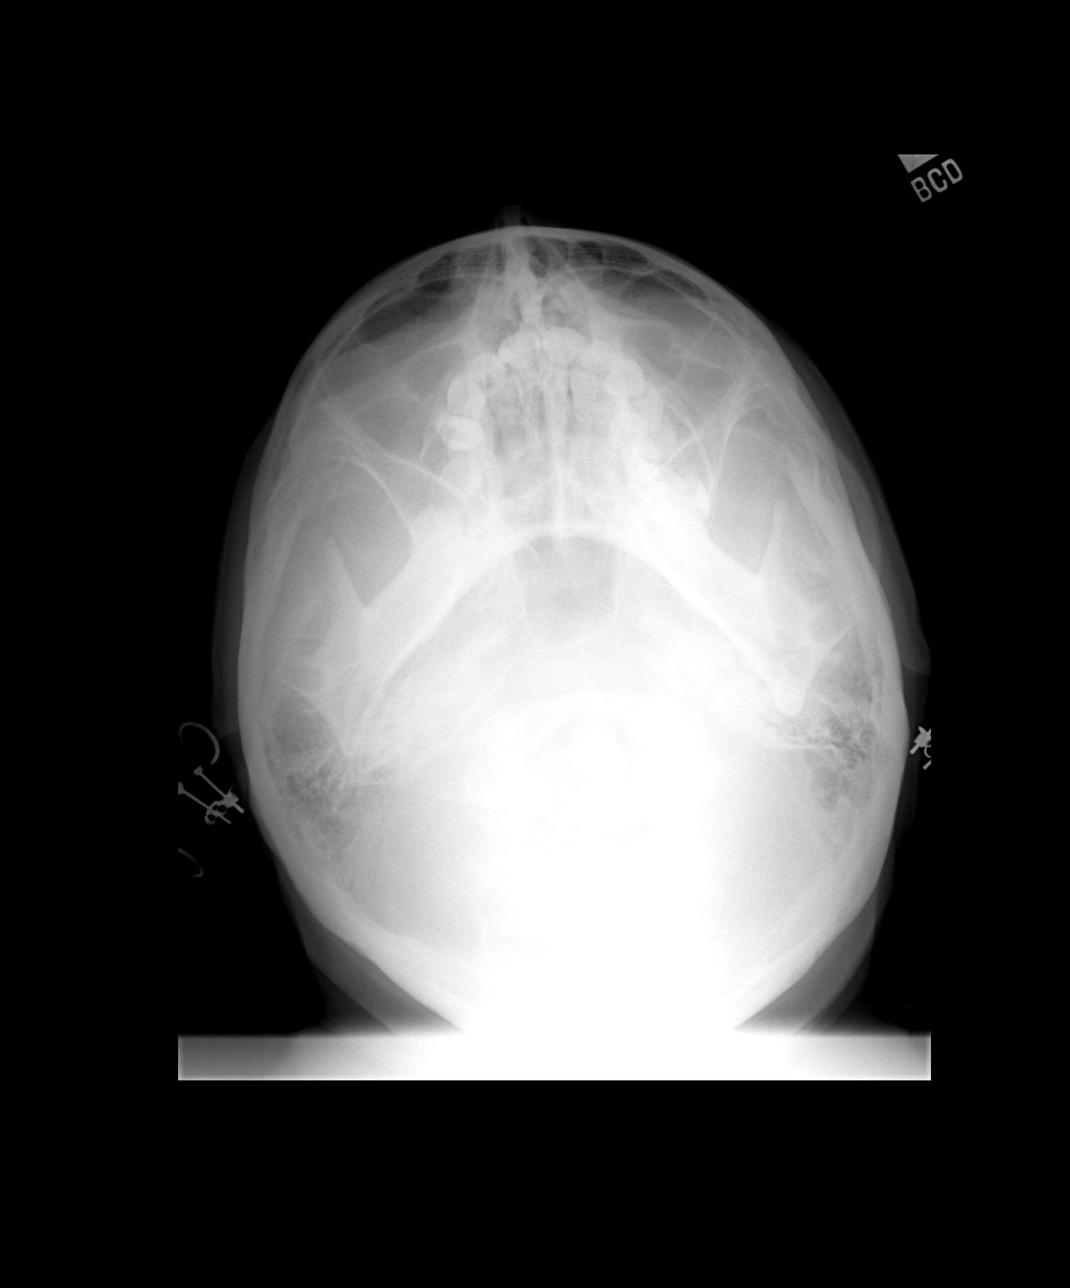

[4 of 4 positions shown; findings below may reference images not displayed]

FINDINGS: Four views of the sinuses demonstrate no evidence for
significant opacification.  The paranasal sinuses and mastoid air
cells are clear.  Incidental note is made of a tongue stud.
IMPRESSION: No significant sinus disease identified.

## 2010-10-30 NOTE — H&P (Signed)
**Note Allison via Obfuscation** Serrano, [name]               ACCOUNT NO.:  [id number]  MEDICAL RECORD NO.:  [id number]  LOCATION:  1320                         FACILITY:  [facility]  PHYSICIAN:  Willis Modena, MD     DATE OF BIRTH:  1981-01-18  DATE OF ADMISSION:  10/21/2010 DATE OF DISCHARGE:                             HISTORY & PHYSICAL   REASON FOR ADMISSION:  Severe esophagitis with bleeding at gastroesophageal Junction.  CHIEF COMPLAINT:  Dysphagia, food impaction.  HISTORY OF PRESENT ILLNESS:  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  She was seen a couple of months ago for an esophageal food impaction that was treated by Dr. Randa Evens.  She was placed on omeprazole 20 mg b.i.d. which she tells me she has been taking.  Her swallowing has improved, but not remitted with this therapy.  After consuming a lasagna meal this evening, Icela tells me that she had sensation of food getting stuck in her throat with difficulty handling her secretions.  She was subsequently seen and had an endoscope done by myself for this.  This study showed no evidence of residual esophageal food impaction.  However, there was severe distal esophagitis with a clot at the GE junction.  With gentle lavage, this washed free, but there was some residual oozing from the ulcer base at her GE junction.  This area was injected a bit with epinephrine which markedly slowed, but did not completely stop the bleeding.  The remainder of her endoscopic exam was suboptimal due to food remnants in the stomach.  PAST MEDICAL HISTORY: 1. C-section x2. 2. Esophageal reflux. 3. History of esophageal strictures with multiple prior endoscopic     dilatations.  FAMILY HISTORY:  Noncontributory.  HOME MEDICATIONS: 1. Omeprazole 20 mg twice a day. 2. Tums as needed.  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  REVIEW OF SYSTEMS:  As per history of present illness.  All other systems performed in detail are negative.  ALLERGIES:  CODEINE which causes nausea and vomiting.  PHYSICAL EXAMINATION:  VITAL SIGNS:  Blood pressure 142/94, heart rate 95, respiratory rate 16. GENERAL:  [name] is in no acute distress.  She is a bit frustrated. EYES:  Conjunctivae pink.  Sclerae anicteric. HEENT:  Normocephalic, atraumatic.  No oropharyngeal lesions.  Dry mucous membranes. NECK:  Supple. LUNGS:  Clear. HEART:  Regular. ABDOMEN:  Soft. NEUROLOGIC:  Diffusely weak, but nonfocal. PSYCHIATRIC:  Somewhat frustrated, but otherwise normal mood and affect. LYMPHATICS:  No palpable axillary, submandibular, supraclavicular adenopathy. SKIN:  No obvious rash or ecchymoses.  LABORATORY STUDIES:  None.  RADIOLOGIC STUDIES:  None.  IMPRESSION:  [name] is a 30 year old female seen for possible esophageal food impaction.  Her food impaction had cleared at time of my endoscopy, but she had a clot at her GE junction what appeared she has severe esophagitis with some slow, but persistent oozing at one of her ulcer beds at her GE junction despite epinephrine treatment.  PLAN: 1. I feel admission at least for 24-  to 48-hour observation is     warranted. 2. We will place her on intravenous Protonix and oral Carafate     suspension. 3. We will follow her blood counts closely. 4. We will have her on sips of clears right now and advance slowly as     tolerated. 5. She, we hope, would be able to be discharged in the next day or two     depending on how her blood counts do and whether she has any overt     symptoms of bleeding.  Thank you for allowing me to participate in [name]'s care.     Willis Modena, MD     WO/MEDQ  D:  10/22/2010  T:  10/22/2010  Job:  [id number]  Electronically Signed by Willis Modena  on 10/30/2010 07:24:11 PM

## 2010-10-30 NOTE — Discharge Summary (Signed)
  NAMEJAKAYLA, [name]               ACCOUNT NO.:  [id number]  MEDICAL RECORD NO.:  [id number]  LOCATION:  1320                         FACILITY:  [facility]  PHYSICIAN:  Willis Modena, MD     DATE OF BIRTH:  1981-02-24  DATE OF ADMISSION:  10/21/2010 DATE OF DISCHARGE:  10/24/2010                              DISCHARGE SUMMARY   ADMISSION DIAGNOSIS:  Severe esophagitis with bleeding at gastroesophageal junction.  DISCHARGE DIAGNOSES:  Severe esophagitis with bleeding at gastroesophageal junction, also include hiatal hernia with dysphagia, and a history of impaction.  HISTORY OF PRESENT ILLNESS:  [name] is a 30 year old female with a couple episodes of esophageal food impaction.  She had a similar episode a few days ago and came in in the middle of the night.  She had an upper endoscopy that showed no impacted food substance, but she has severe ulcerative esophagitis with some bleeding at the GE junction that required epinephrine injection.  Given this bleeding, we elected to admit her for medical management.  HOSPITAL COURSE:  She was admitted, placed on sips of clears and IV proton pump inhibitor therapy.  She was able to slowly titrate her diet upwards and had much improvement with her swallowing.  Her hemoglobin remained stable and she had no evidence of overt bleeding.  She was felt to be appropriate for discharge home.  DISCHARGE MEDICATIONS: 1. Zofran oral dissolvable tablet 4 mg 1 by mouth or sublingually     every 6 hours as needed, dispense total number of tablets of 45     with 2 refills. 2. Protonix 40 mg 1 p.o. b.i.d., dispense 60 tablets with 3 refills.  DISCHARGE DIET:  Bland diet.  Minimize caffeine, minimize spicy or greasy foods.  DISCHARGE FOLLOWUP:  She will need to follow with [place] at [facility] gastroenterology in the next 2-3 weeks.  She was counseled extensively on the importance of maintaining her followup appointment and counseled extensively on the  importance of taking her proton pump inhibitor therapy.  She was also told that after a few weeks, she will ultimately require repeat endoscopy for dilatation of her [name] ring.  It was emphasized that she should, prior to any upcoming dilatation, chew her food very well and to hopefully decrease the risk of further future food impaction episodes.  Thank you again for allowing me to participate in [name]'s care.     Willis Modena, MD     WO/MEDQ  D:  10/24/2010  T:  10/25/2010  Job:  [id number]  Electronically Signed by Willis Modena  on 10/30/2010 07:24:13 PM

## 2010-12-01 ENCOUNTER — Ambulatory Visit (HOSPITAL_COMMUNITY)
Admission: RE | Admit: 2010-12-01 | Discharge: 2010-12-01 | Disposition: A | Discharge status: Home / Self Care | Payer: Medicaid Other | Source: Ambulatory Visit | Attending: Gastroenterology | Admitting: Gastroenterology

## 2010-12-01 DIAGNOSIS — R1310 Dysphagia, unspecified: Secondary | ICD-10-CM | POA: Insufficient documentation

## 2010-12-01 DIAGNOSIS — K219 Gastro-esophageal reflux disease without esophagitis: Secondary | ICD-10-CM | POA: Insufficient documentation

## 2010-12-01 DIAGNOSIS — K449 Diaphragmatic hernia without obstruction or gangrene: Secondary | ICD-10-CM | POA: Insufficient documentation

## 2010-12-01 DIAGNOSIS — J45909 Unspecified asthma, uncomplicated: Secondary | ICD-10-CM | POA: Insufficient documentation

## 2010-12-01 DIAGNOSIS — K2210 Ulcer of esophagus without bleeding: Secondary | ICD-10-CM | POA: Insufficient documentation

## 2010-12-01 DIAGNOSIS — K222 Esophageal obstruction: Secondary | ICD-10-CM | POA: Insufficient documentation

## 2010-12-01 DIAGNOSIS — Z79899 Other long term (current) drug therapy: Secondary | ICD-10-CM | POA: Insufficient documentation

## 2010-12-31 LAB — CBC
HCT: 37.9
Hemoglobin: 13
MCHC: 34.2
MCV: 89.4
Platelets: 263
RBC: 4.24
RDW: 12.9
WBC: 6.4

## 2010-12-31 LAB — DIFFERENTIAL
Basophils Absolute: 0
Basophils Relative: 1
Eosinophils Absolute: 0.3
Eosinophils Relative: 4
Lymphocytes Relative: 26
Lymphs Abs: 1.6
Monocytes Absolute: 0.5
Monocytes Relative: 8
Neutro Abs: 3.9
Neutrophils Relative %: 62

## 2010-12-31 LAB — WOUND CULTURE

## 2011-01-13 ENCOUNTER — Other Ambulatory Visit: Payer: Self-pay | Admitting: Gastroenterology

## 2011-01-13 ENCOUNTER — Ambulatory Visit (HOSPITAL_COMMUNITY)
Admission: RE | Admit: 2011-01-13 | Discharge: 2011-01-13 | Disposition: A | Discharge status: Home / Self Care | Payer: Medicaid Other | Source: Ambulatory Visit | Attending: Gastroenterology | Admitting: Gastroenterology

## 2011-01-13 DIAGNOSIS — K219 Gastro-esophageal reflux disease without esophagitis: Secondary | ICD-10-CM | POA: Insufficient documentation

## 2011-01-13 DIAGNOSIS — K222 Esophageal obstruction: Secondary | ICD-10-CM | POA: Insufficient documentation

## 2011-01-13 DIAGNOSIS — Z79899 Other long term (current) drug therapy: Secondary | ICD-10-CM | POA: Insufficient documentation

## 2011-01-13 DIAGNOSIS — K2210 Ulcer of esophagus without bleeding: Secondary | ICD-10-CM | POA: Insufficient documentation

## 2011-01-13 DIAGNOSIS — J45909 Unspecified asthma, uncomplicated: Secondary | ICD-10-CM | POA: Insufficient documentation

## 2011-01-13 DIAGNOSIS — R1310 Dysphagia, unspecified: Secondary | ICD-10-CM | POA: Insufficient documentation

## 2011-02-15 ENCOUNTER — Other Ambulatory Visit: Payer: Self-pay | Admitting: Adult Health

## 2011-02-15 ENCOUNTER — Other Ambulatory Visit (HOSPITAL_COMMUNITY)
Admission: RE | Admit: 2011-02-15 | Discharge: 2011-02-15 | Disposition: A | Discharge status: Home / Self Care | Payer: Medicaid Other | Source: Ambulatory Visit | Attending: Obstetrics and Gynecology | Admitting: Obstetrics and Gynecology

## 2011-02-15 DIAGNOSIS — Z01419 Encounter for gynecological examination (general) (routine) without abnormal findings: Secondary | ICD-10-CM | POA: Insufficient documentation

## 2011-05-10 ENCOUNTER — Other Ambulatory Visit: Payer: Self-pay | Admitting: Otolaryngology

## 2011-05-10 ENCOUNTER — Ambulatory Visit
Admission: RE | Admit: 2011-05-10 | Discharge: 2011-05-10 | Disposition: A | Discharge status: Home / Self Care | Payer: Medicaid Other | Source: Ambulatory Visit | Attending: Otolaryngology | Admitting: Otolaryngology

## 2011-05-10 DIAGNOSIS — Z01818 Encounter for other preprocedural examination: Secondary | ICD-10-CM

## 2011-06-14 ENCOUNTER — Encounter (HOSPITAL_COMMUNITY): Payer: Self-pay | Admitting: *Deleted

## 2011-06-16 ENCOUNTER — Ambulatory Visit (HOSPITAL_COMMUNITY): Payer: Medicaid Other | Admitting: Anesthesiology

## 2011-06-16 ENCOUNTER — Encounter (HOSPITAL_COMMUNITY)
Admission: RE | Disposition: A | Discharge status: Home / Self Care | Payer: Self-pay | Source: Ambulatory Visit | Attending: Gastroenterology

## 2011-06-16 ENCOUNTER — Ambulatory Visit (HOSPITAL_COMMUNITY)
Admission: RE | Admit: 2011-06-16 | Discharge: 2011-06-16 | Disposition: A | Discharge status: Home / Self Care | Payer: Medicaid Other | Source: Ambulatory Visit | Attending: Gastroenterology | Admitting: Gastroenterology

## 2011-06-16 ENCOUNTER — Encounter (HOSPITAL_COMMUNITY): Payer: Self-pay | Admitting: Anesthesiology

## 2011-06-16 ENCOUNTER — Encounter (HOSPITAL_COMMUNITY): Payer: Self-pay | Admitting: *Deleted

## 2011-06-16 DIAGNOSIS — R1310 Dysphagia, unspecified: Secondary | ICD-10-CM | POA: Insufficient documentation

## 2011-06-16 DIAGNOSIS — K222 Esophageal obstruction: Secondary | ICD-10-CM | POA: Insufficient documentation

## 2011-06-16 DIAGNOSIS — K200 Eosinophilic esophagitis: Secondary | ICD-10-CM | POA: Insufficient documentation

## 2011-06-16 HISTORY — DX: Personal history of other diseases of the digestive system: Z87.19

## 2011-06-16 HISTORY — PX: ESOPHAGOGASTRODUODENOSCOPY: SHX5428

## 2011-06-16 HISTORY — DX: Other specified postprocedural states: Z98.890

## 2011-06-16 HISTORY — PX: BALLOON DILATION: SHX5330

## 2011-06-16 HISTORY — DX: Other specified postprocedural states: R11.2

## 2011-06-16 HISTORY — DX: Gastro-esophageal reflux disease without esophagitis: K21.9

## 2011-06-16 SURGERY — EGD (ESOPHAGOGASTRODUODENOSCOPY)
Anesthesia: Monitor Anesthesia Care

## 2011-06-16 MED ORDER — FENTANYL CITRATE 0.05 MG/ML IJ SOLN
INTRAMUSCULAR | Status: DC | PRN
  Administered 2011-06-16: 50 ug via INTRAVENOUS

## 2011-06-16 MED ORDER — PROPOFOL 10 MG/ML IV EMUL
INTRAVENOUS | Status: DC | PRN
  Administered 2011-06-16: 140 ug/kg/min via INTRAVENOUS

## 2011-06-16 MED ORDER — LACTATED RINGERS IV SOLN: INTRAVENOUS | Status: DC

## 2011-06-16 MED ORDER — MIDAZOLAM HCL 5 MG/5ML IJ SOLN
INTRAMUSCULAR | Status: DC | PRN
  Administered 2011-06-16: 2 mg via INTRAVENOUS

## 2011-06-16 MED ORDER — BUTAMBEN-TETRACAINE-BENZOCAINE 2-2-14 % EX AERO
INHALATION_SPRAY | CUTANEOUS | Status: DC | PRN
  Administered 2011-06-16: 2 via TOPICAL

## 2011-06-16 MED ORDER — KETAMINE HCL 10 MG/ML IJ SOLN
INTRAMUSCULAR | Status: DC | PRN
  Administered 2011-06-16: 10 mg via INTRAVENOUS

## 2011-06-16 MED ORDER — MONTELUKAST SODIUM 10 MG PO TABS: 10.0000 mg | ORAL_TABLET | Freq: Every day | ORAL | Status: AC

## 2011-06-16 MED ORDER — LACTATED RINGERS IV SOLN
INTRAVENOUS | Status: DC | PRN
  Administered 2011-06-16: 10:00:00 via INTRAVENOUS

## 2011-06-16 NOTE — Discharge Instructions (Addendum)
Endoscopy After Care Please read the instructions outlined below and refer to this sheet in the next few weeks. These discharge instructions provide you with general information on caring for yourself after you leave the hospital. Your doctor may also give you specific instructions. While your treatment has been planned according to the most current medical practices available, unavoidable complications occasionally occur. If you have any problems or questions after discharge, please call Dr. Dulce Sellar [facility]) at [phone].  HOME CARE INSTRUCTIONS Activity  You may resume your regular activity but move at a slower pace for the next 24 hours.   Take frequent rest periods for the next 24 hours.   Walking will help expel (get rid of) the air and reduce the bloated feeling in your abdomen.   No driving for 24 hours (because of the anesthesia (medicine) used during the test).   You may shower.   Do not sign any important legal documents or operate any machinery for 24 hours (because of the anesthesia used during the test).  Nutrition  Drink plenty of fluids.   Pureed type diet only.  Do NOT eat fibrous meats, vegetables, or breads.  Avoid alcoholic beverages for 24 hours or as instructed by your caregiver.  Medications You may resume your normal medications unless your caregiver tells you otherwise. What you can expect today  You may experience abdominal discomfort such as a feeling of fullness or "gas" pains.   You may experience a sore throat for 2 to 3 days. This is normal. Gargling with salt water may help this.    SEEK IMMEDIATE MEDICAL CARE IF:  You have excessive nausea (feeling sick to your stomach) and/or vomiting.   You have severe abdominal pain and distention (swelling).   You have trouble swallowing.   You have a temperature over 100 F (37.8 C).   You have rectal bleeding or vomiting of blood.  Document Released: 10/21/2003 Document Revised:  11/18/2010 Document Reviewed: 05/03/2007 [facility] Patient Information 2012 [place], [place].  Moderate Sedation, Adult Moderate sedation is given to help you relax or even sleep through a procedure. You may remain sleepy, be clumsy, or have poor balance for several hours following this procedure. Arrange for a responsible adult, family member, or friend to take you home. A responsible adult should stay with you for at least 24 hours or until the medicines have worn off.  Do not participate in any activities where you could become injured for the next 24 hours, or until you feel normal again. Do not:   Drive.   Swim.   Ride a bicycle.   Operate heavy machinery.   Cook.   Use power tools.   Climb ladders.   Work at [employer].   Do not make important decisions or sign legal documents until you are improved.   Vomiting may occur if you eat too soon. When you can drink without vomiting, try water, juice, or soup. Try solid foods if you feel little or no nausea.   Only take over-the-counter or prescription medications for pain, discomfort, or fever as directed by your caregiver.If pain medications have been prescribed for you, ask your caregiver how soon it is safe to take them.   Make sure you and your family fully understands everything about the medication given to you. Make sure you understand what side effects may occur.   You should not drink alcohol, take sleeping pills, or medications that cause drowsiness for at least 24 hours.   If you smoke,  do not smoke alone.   If you are feeling better, you may resume normal activities 24 hours after receiving sedation.   Keep all appointments as scheduled. Follow all instructions.   Ask questions if you do not understand.  SEEK MEDICAL CARE IF:   Your skin is pale or bluish in color.   You continue to feel sick to your stomach (nauseous) or throw up (vomit).   Your pain is getting worse and not helped by medication.   You have  bleeding or swelling.   You are still sleepy or feeling clumsy after 24 hours.  SEEK IMMEDIATE MEDICAL CARE IF:   You develop a rash.   You have difficulty breathing.   You develop any type of allergic problem.   You have a fever.  Document Released: 12/01/2000 Document Revised: 02/25/2011 Document Reviewed: 04/24/2007 [facility] [facility] Patient Information 2012 [place], [place].

## 2011-06-16 NOTE — Anesthesia Postprocedure Evaluation (Signed)
  Anesthesia Post-op Note  Patient: [name]  Procedure(s) Performed: Procedure(s) (LRB): ESOPHAGOGASTRODUODENOSCOPY (EGD) (N/A) BALLOON DILATION (N/A)  Patient Location: PACU  Anesthesia Type: MAC  Level of Consciousness: oriented and sedated  Airway and Oxygen Therapy: Patient Spontanous Breathing  Post-op Pain: none  Post-op Assessment: Post-op Vital signs reviewed, Patient's Cardiovascular Status Stable, Respiratory Function Stable and Patent Airway  Post-op Vital Signs: stable  Complications: No apparent anesthesia complications

## 2011-06-16 NOTE — H&P (Signed)
Patient interval history reviewed.  Patient examined again.  There has been no change from documented H/P dated 06/07/2011 (scanned into chart from our office) except as documented above.  Assessment:  Eosinophilic esophagitis, dysphagia despite fluticasone, history esophageal strictures.  Plan:  Endoscopy with TTS balloon dilatation, as needed.  Risks (bleeding, infection, bowel perforation that could require surgery, sedation-related changes in cardiopulmonary systems), benefits (identification and possible treatment of source of symptoms, exclusion of certain causes of symptoms), and alternatives (watchful waiting, radiographic imaging studies, empiric medical treatment) of upper endoscopy (EGD) were explained to patient/family in detail and patient wishes to proceed.

## 2011-06-16 NOTE — Anesthesia Preprocedure Evaluation (Signed)
Anesthesia Evaluation  Patient identified by MRN, date of birth, ID band Patient awake    Reviewed: Allergy & Precautions, H&P , NPO status , Patient's Chart, lab work & pertinent test results, reviewed documented beta blocker date and time   History of Anesthesia Complications (+) PONV  Airway Mallampati: II TM Distance: >3 FB Neck ROM: Full    Dental  (+) Teeth Intact and Dental Advisory Given   Pulmonary asthma ,  Mild asthma, inhaler prn breath sounds clear to auscultation        Cardiovascular negative cardio ROS  Rhythm:Regular Rate:Normal  Denies cardiac symptoms   Neuro/Psych negative neurological ROS  negative psych ROS   GI/Hepatic Neg liver ROS, dysphagia   Endo/Other  negative endocrine ROS  Renal/GU negative Renal ROS  negative genitourinary   Musculoskeletal negative musculoskeletal ROS (+)   Abdominal   Peds negative pediatric ROS (+)  Hematology negative hematology ROS (+)   Anesthesia Other Findings   Reproductive/Obstetrics negative OB ROS                           Anesthesia Physical Anesthesia Plan  ASA: II  Anesthesia Plan: MAC   Post-op Pain Management:    Induction: Intravenous  Airway Management Planned: Mask  Additional Equipment:   Intra-op Plan:   Post-operative Plan:   Informed Consent: I have reviewed the patients History and Physical, chart, labs and discussed the procedure including the risks, benefits and alternatives for the proposed anesthesia with the patient or authorized representative who has indicated his/her understanding and acceptance.     Plan Discussed with: CRNA and Surgeon  Anesthesia Plan Comments:         Anesthesia Quick Evaluation

## 2011-06-16 NOTE — Transfer of Care (Signed)
Immediate Anesthesia Transfer of Care Note  Patient: [name]  Procedure(s) Performed: Procedure(s) (LRB): ESOPHAGOGASTRODUODENOSCOPY (EGD) (N/A) BALLOON DILATION (N/A)  Patient Location: PACU  Anesthesia Type: MAC  Level of Consciousness: awake and alert   Airway & Oxygen Therapy: Patient Spontanous Breathing and Patient connected to nasal cannula oxygen  Post-op Assessment: Report given to PACU RN and Post -op Vital signs reviewed and stable  Post vital signs: Reviewed and stable  Complications: No apparent anesthesia complications

## 2011-06-16 NOTE — Op Note (Signed)
[facility] [address] [place], [place]  [address]  ENDOSCOPY PROCEDURE REPORT  PATIENT:  Allison, Serrano  MR#:  [id number] BIRTHDATE:  1980/09/17, 30 yrs. old  GENDER:  female  ENDOSCOPIST:  Willis Modena, MD Referred by:  Butch Penny, M.D.  PROCEDURE DATE:  06/16/2011 PROCEDURE:  EGD with balloon dilatation ASA CLASS:  Class II INDICATIONS:  dysphagia, esophageal stricture, eosinophilic esophagitis  MEDICATIONS:   Cetacaine spray x 2, MAC sedation, administered by CRNA  DESCRIPTION OF PROCEDURE:   After the risks benefits and alternatives of the procedure were thoroughly explained, informed consent was obtained.  The Pentax Gastroscope [id number] endoscope was introduced through the mouth and advanced to the second portion of the duodenum, without limitations.  The instrument was slowly withdrawn as the mucosa was fully examined.  <<PROCEDUREIMAGES>>  FINDINGS:  Tight benign-appearing stricture in distal esophagus just upstream from GE junction, estimated luminal diameter 8mm. Could not pass endoscope through this area.  TTS balloons were used to serially dilate the stricture to 9mm (minimal resistance, 60 seconds) 10mm (minimal resistance, 60 seconds), 11mm (mild-to-moderate resistance, 60 seconds), and finally 12mm (moderate resistance, 60 seconds).  There was some expected mucosal tearing and bloody show post dilatation.  Endoscope could subsequently traverse the stricture.  The stomach, pylorus and duodenum to the second portion was normal.  ENDOSCOPIC IMPRESSION:    1.  Tight eosinophilic esophagitis-associated distal stricture, serially dilated to 12mm as above. 2.  Otherwise normal endoscopy.  RECOMMENDATIONS:      1. Watch for potential complications of procedure. 2.  Continue swallowed fluticasone and singulair. 3.  Soft, pureed-type diet for now.  No fibrous meats, breads, or vegetables. 4.  Will need repeat endoscopy for further  esophageal dilatation in 2 weeks.  REPEAT EXAM:  2 weeks  ______________________________ Willis Modena  CC:  n. eSIGNEDWillis Modena at 06/16/2011 10:16 AM  Lung, [name], [phone]

## 2011-06-17 ENCOUNTER — Encounter (HOSPITAL_COMMUNITY): Payer: Self-pay | Admitting: Gastroenterology

## 2011-07-07 ENCOUNTER — Encounter (HOSPITAL_COMMUNITY)
Admission: RE | Disposition: A | Discharge status: Home / Self Care | Payer: Self-pay | Source: Ambulatory Visit | Attending: Gastroenterology

## 2011-07-07 ENCOUNTER — Ambulatory Visit (HOSPITAL_COMMUNITY): Payer: Medicaid Other | Admitting: Anesthesiology

## 2011-07-07 ENCOUNTER — Ambulatory Visit (HOSPITAL_COMMUNITY)
Admission: RE | Admit: 2011-07-07 | Discharge: 2011-07-07 | Disposition: A | Discharge status: Home / Self Care | Payer: Medicaid Other | Source: Ambulatory Visit | Attending: Gastroenterology | Admitting: Gastroenterology

## 2011-07-07 ENCOUNTER — Encounter (HOSPITAL_COMMUNITY): Payer: Self-pay | Admitting: Anesthesiology

## 2011-07-07 ENCOUNTER — Encounter (HOSPITAL_COMMUNITY): Payer: Self-pay | Admitting: *Deleted

## 2011-07-07 DIAGNOSIS — K222 Esophageal obstruction: Secondary | ICD-10-CM | POA: Insufficient documentation

## 2011-07-07 DIAGNOSIS — R1310 Dysphagia, unspecified: Secondary | ICD-10-CM | POA: Insufficient documentation

## 2011-07-07 DIAGNOSIS — K200 Eosinophilic esophagitis: Secondary | ICD-10-CM | POA: Insufficient documentation

## 2011-07-07 HISTORY — PX: ESOPHAGOGASTRODUODENOSCOPY: SHX1529

## 2011-07-07 HISTORY — PX: BALLOON DILATION: SHX5330

## 2011-07-07 SURGERY — ESOPHAGOGASTRODUODENOSCOPY (EGD) WITH PROPOFOL
Anesthesia: Monitor Anesthesia Care

## 2011-07-07 MED ORDER — FENTANYL CITRATE 0.05 MG/ML IJ SOLN
INTRAMUSCULAR | Status: DC | PRN
  Administered 2011-07-07: 50 ug via INTRAVENOUS

## 2011-07-07 MED ORDER — LACTATED RINGERS IV SOLN
INTRAVENOUS | Status: DC
  Administered 2011-07-07: 1000 mL via INTRAVENOUS

## 2011-07-07 MED ORDER — MIDAZOLAM HCL 5 MG/5ML IJ SOLN
INTRAMUSCULAR | Status: DC | PRN
  Administered 2011-07-07: 2 mg via INTRAVENOUS

## 2011-07-07 MED ORDER — LACTATED RINGERS IV SOLN
INTRAVENOUS | Status: DC | PRN
  Administered 2011-07-07: 11:00:00 via INTRAVENOUS

## 2011-07-07 MED ORDER — BUTAMBEN-TETRACAINE-BENZOCAINE 2-2-14 % EX AERO
INHALATION_SPRAY | CUTANEOUS | Status: DC | PRN
  Administered 2011-07-07: 2 via TOPICAL

## 2011-07-07 MED ORDER — KETAMINE HCL 50 MG/ML IJ SOLN
INTRAMUSCULAR | Status: DC | PRN
Start: 2011-07-07 — End: 2011-07-07
  Administered 2011-07-07: 25 mg via INTRAMUSCULAR

## 2011-07-07 MED ORDER — PROPOFOL 10 MG/ML IV EMUL
INTRAVENOUS | Status: DC | PRN
  Administered 2011-07-07: 200 ug/kg/min via INTRAVENOUS

## 2011-07-07 SURGICAL SUPPLY — 15 items

## 2011-07-07 NOTE — H&P (Signed)
Patient interval history reviewed.  Patient examined again.  There has been no change from documented H/P dated 07/05/11 (scanned into chart from our office) except as documented above.  Assessment:   1.  Eosinophilic esophagitis. 2.  Distal esophageal stricture, likely from #1 above.  Recent endoscopic dilatation to 12-mm couple weeks ago.  Plan: 1. Upper endoscopy with balloon dilatation of distal esophageal stricture.  Risks (bleeding, infection, bowel perforation that could require surgery, sedation-related changes in cardiopulmonary systems), benefits (identification and possible treatment of source of symptoms, exclusion of certain causes of symptoms), and alternatives (watchful waiting, radiographic imaging studies, empiric medical treatment) of upper endoscopy with esophageal dilatation (EGD + dil) were explained to patient in detail and she wishes to proceed.

## 2011-07-07 NOTE — Anesthesia Preprocedure Evaluation (Signed)
Anesthesia Evaluation  Patient identified by MRN, date of birth, ID band Patient awake    Reviewed: Allergy & Precautions, H&P , NPO status , Patient's Chart, lab work & pertinent test results  History of Anesthesia Complications (+) PONV  Airway Mallampati: II TM Distance: >3 FB Neck ROM: Full    Dental No notable dental hx.    Pulmonary asthma ,  breath sounds clear to auscultation  Pulmonary exam normal       Cardiovascular negative cardio ROS  Rhythm:Regular Rate:Normal     Neuro/Psych negative neurological ROS  negative psych ROS   GI/Hepatic Neg liver ROS, hiatal hernia, GERD-  Medicated,  Endo/Other  negative endocrine ROS  Renal/GU negative Renal ROS  negative genitourinary   Musculoskeletal negative musculoskeletal ROS (+)   Abdominal   Peds negative pediatric ROS (+)  Hematology negative hematology ROS (+)   Anesthesia Other Findings   Reproductive/Obstetrics negative OB ROS                           Anesthesia Physical Anesthesia Plan  ASA: II  Anesthesia Plan: MAC   Post-op Pain Management:    Induction: Intravenous  Airway Management Planned: Nasal Cannula  Additional Equipment:   Intra-op Plan:   Post-operative Plan: Extubation in OR  Informed Consent: I have reviewed the patients History and Physical, chart, labs and discussed the procedure including the risks, benefits and alternatives for the proposed anesthesia with the patient or authorized representative who has indicated his/her understanding and acceptance.   Dental advisory given  Plan Discussed with: CRNA  Anesthesia Plan Comments:         Anesthesia Quick Evaluation

## 2011-07-07 NOTE — Transfer of Care (Signed)
Immediate Anesthesia Transfer of Care Note  Patient: [name]  Procedure(s) Performed: Procedure(s) (LRB): ESOPHAGOGASTRODUODENOSCOPY (EGD) WITH PROPOFOL (N/A) BALLOON DILATION (N/A)  Patient Location: PACU  Anesthesia Type: MAC  Level of Consciousness: awake, sedated and patient cooperative  Airway & Oxygen Therapy: Patient Spontanous Breathing and Patient connected to nasal cannula oxygen  Post-op Assessment: Report given to PACU RN and Post -op Vital signs reviewed and stable  Post vital signs: Reviewed and stable  Complications: No apparent anesthesia complications

## 2011-07-07 NOTE — Anesthesia Postprocedure Evaluation (Signed)
  Anesthesia Post-op Note  Patient: [name]  Procedure(s) Performed: Procedure(s) (LRB): ESOPHAGOGASTRODUODENOSCOPY (EGD) WITH PROPOFOL (N/A) BALLOON DILATION (N/A)  Patient Location: PACU  Anesthesia Type: MAC  Level of Consciousness: awake and alert   Airway and Oxygen Therapy: Patient Spontanous Breathing  Post-op Pain: mild  Post-op Assessment: Post-op Vital signs reviewed, Patient's Cardiovascular Status Stable, Respiratory Function Stable, Patent Airway and No signs of Nausea or vomiting  Post-op Vital Signs: stable  Complications: No apparent anesthesia complications

## 2011-07-07 NOTE — Op Note (Signed)
[facility] [address] [place], [place]  [address]  ENDOSCOPY PROCEDURE REPORT  PATIENT:  [name], [name]  MR#:  [id number] BIRTHDATE:  January 12, 1981, 30 yrs. old  GENDER:  female  ENDOSCOPIST:  Willis Modena, MD Referred by:  Butch Penny, M.D.  PROCEDURE DATE:  07/07/2011 PROCEDURE:  EGD with balloon dilatation ASA CLASS:  Class II INDICATIONS:  eosinophilic esophagitis, dysphagia, history distal esophageal stricture MEDICATIONS:  MAC sedation, administered by CRNA, Cetacaine spray x 2  DESCRIPTION OF PROCEDURE:   After the risks benefits and alternatives of the procedure were thoroughly explained, informed consent was obtained.  The Pentax Gastroscope [id number] endoscope was introduced through the mouth and advanced to the second portion of the duodenum, without limitations.  The instrument was slowly withdrawn as the mucosa was fully examined.  <<PROCEDUREIMAGES>>  FINDINGS:  Distal esophageal stricture again appreciated, with persistent mucosal findings consistent with known eosinophilic esophagitis.  There was some mild distal esophagitis as well.  The diagnostic endoscope passed through the stricture with mild resistance.  The stomach, pylorus, and duodenum were again normal. The stricture was dilated to 12mm a couple weeks ago.  In light of this, the patient's clinical history as well as today's endoscopic findings, the distal esophageal stricture was serially dilated from 13.82mm (60 seconds, mild-to-moderate resistance) to 15mm (60 seconds, moderate resistance) with TTS esophageal balloon dilating catheter.  There was some expected mucosal disruption and bloody show post-dilatation.  ENDOSCOPIC IMPRESSION:    1.  Eosinophilic and possible GERD-related distal esophageal stricture; dilated to 15mm as above. 2.  Otherwise normal endoscopy.  RECOMMENDATIONS:      1.  Watch for potential complications of procedure. 2.  Continue esomeprazole, singulair, and  swallowed fluticasone. 3.  Will need another endoscopic dilatation in another week or two.  REPEAT EXAM:  Yes, EGD + DIL, in 1-2 weeks.  ______________________________ Willis Modena  CC:  n. eSIGNEDWillis Modena at 07/07/2011 11:16 AM  Lung, [name], [phone]

## 2011-07-07 NOTE — Discharge Instructions (Addendum)
Endoscopy Care After Please read the instructions outlined below and refer to this sheet in the next few weeks. These discharge instructions provide you with general information on caring for yourself after you leave the hospital. Your doctor may also give you specific instructions. While your treatment has been planned according to the most current medical practices available, unavoidable complications occasionally occur. If you have any problems or questions after discharge, please call Dr. Dulce Sellar [facility]) at [phone].  HOME CARE INSTRUCTIONS Activity  You may resume your regular activity but move at a slower pace for the next 24 hours.   Take frequent rest periods for the next 24 hours.   Walking will help expel (get rid of) the air and reduce the bloated feeling in your abdomen.   No driving for 24 hours (because of the anesthesia (medicine) used during the test).   You may shower.   Do not sign any important legal documents or operate any machinery for 24 hours (because of the anesthesia used during the test).  Nutrition  Drink plenty of fluids.   You may resume your normal diet.   Pureed and liquid diet preferred.  Can gradually start to increase your diet, as long as it goes down well, and as long as it is well-chewed. Medications You may resume your normal medications unless your caregiver tells you otherwise. What you can expect today  You may experience abdominal discomfort such as a feeling of fullness or "gas" pains.   You may experience a sore throat for 2 to 3 days. This is normal. Gargling with salt water may help this.    SEEK IMMEDIATE MEDICAL CARE IF:  You have excessive nausea (feeling sick to your stomach) and/or vomiting.   You have severe abdominal pain and distention (swelling).   You have trouble swallowing.   You have a temperature over 100 F (37.8 C).   You have rectal bleeding or vomiting of blood.  Document Released:  10/21/2003 Document Revised: 11/18/2010 Document Reviewed: 05/03/2007 [facility] Patient Information 2012 [place], [place].

## 2011-07-08 ENCOUNTER — Encounter (HOSPITAL_COMMUNITY): Payer: Self-pay | Admitting: Gastroenterology

## 2011-07-26 DIAGNOSIS — J4520 Mild intermittent asthma, uncomplicated: Secondary | ICD-10-CM | POA: Insufficient documentation

## 2011-07-26 DIAGNOSIS — K200 Eosinophilic esophagitis: Secondary | ICD-10-CM | POA: Insufficient documentation

## 2011-07-28 ENCOUNTER — Ambulatory Visit (HOSPITAL_COMMUNITY): Payer: Medicaid Other | Admitting: Anesthesiology

## 2011-07-28 ENCOUNTER — Encounter (HOSPITAL_COMMUNITY)
Admission: RE | Disposition: A | Discharge status: Home / Self Care | Payer: Self-pay | Source: Ambulatory Visit | Attending: Gastroenterology

## 2011-07-28 ENCOUNTER — Encounter (HOSPITAL_COMMUNITY): Payer: Self-pay | Admitting: *Deleted

## 2011-07-28 ENCOUNTER — Ambulatory Visit (HOSPITAL_COMMUNITY)
Admission: RE | Admit: 2011-07-28 | Discharge: 2011-07-28 | Disposition: A | Discharge status: Home / Self Care | Payer: Medicaid Other | Source: Ambulatory Visit | Attending: Gastroenterology | Admitting: Gastroenterology

## 2011-07-28 ENCOUNTER — Encounter (HOSPITAL_COMMUNITY): Payer: Self-pay | Admitting: Anesthesiology

## 2011-07-28 DIAGNOSIS — R1310 Dysphagia, unspecified: Secondary | ICD-10-CM | POA: Insufficient documentation

## 2011-07-28 DIAGNOSIS — K222 Esophageal obstruction: Secondary | ICD-10-CM | POA: Insufficient documentation

## 2011-07-28 DIAGNOSIS — K208 Other esophagitis without bleeding: Secondary | ICD-10-CM | POA: Insufficient documentation

## 2011-07-28 HISTORY — PX: BALLOON DILATION: SHX5330

## 2011-07-28 HISTORY — PX: ESOPHAGOGASTRODUODENOSCOPY: SHX5428

## 2011-07-28 SURGERY — EGD (ESOPHAGOGASTRODUODENOSCOPY)
Anesthesia: Monitor Anesthesia Care

## 2011-07-28 MED ORDER — GLYCOPYRROLATE 0.2 MG/ML IJ SOLN
INTRAMUSCULAR | Status: DC | PRN
  Administered 2011-07-28: .2 mg via INTRAVENOUS

## 2011-07-28 MED ORDER — PROPOFOL 10 MG/ML IV EMUL
INTRAVENOUS | Status: DC | PRN
  Administered 2011-07-28: 140 ug/kg/min via INTRAVENOUS

## 2011-07-28 MED ORDER — KETAMINE HCL 10 MG/ML IJ SOLN
INTRAMUSCULAR | Status: DC | PRN
  Administered 2011-07-28: 40 mg via INTRAVENOUS

## 2011-07-28 MED ORDER — LACTATED RINGERS IV SOLN
INTRAVENOUS | Status: DC
  Administered 2011-07-28: 1000 mL via INTRAVENOUS

## 2011-07-28 MED ORDER — MIDAZOLAM HCL 5 MG/5ML IJ SOLN
INTRAMUSCULAR | Status: DC | PRN
  Administered 2011-07-28 (×2): 1 mg via INTRAVENOUS

## 2011-07-28 MED ORDER — BUTAMBEN-TETRACAINE-BENZOCAINE 2-2-14 % EX AERO
INHALATION_SPRAY | CUTANEOUS | Status: DC | PRN
  Administered 2011-07-28: 2 via TOPICAL

## 2011-07-28 MED ORDER — FENTANYL CITRATE 0.05 MG/ML IJ SOLN
INTRAMUSCULAR | Status: DC | PRN
  Administered 2011-07-28 (×2): 50 ug via INTRAVENOUS

## 2011-07-28 MED ORDER — FENTANYL CITRATE 0.05 MG/ML IJ SOLN: 25.0000 ug | INTRAMUSCULAR | Status: DC | PRN

## 2011-07-28 MED ORDER — ONDANSETRON HCL 4 MG/2ML IJ SOLN
INTRAMUSCULAR | Status: DC | PRN
  Administered 2011-07-28: 4 mg via INTRAVENOUS

## 2011-07-28 NOTE — H&P (Signed)
Patient interval history reviewed.  Patient examined again.  There has been no change from documented H/P dated 07/12/11 (scanned into chart from our office) except as documented above.  Assessment:   1.  Persistent, albeit slowly improving, dysphagia. 2.  Eosinophilic esophagitis with distal esophageal stricture.  Plan:   1.  Endoscopy with likely balloon esophageal dilatation. 2.  Risks (bleeding, infection, bowel perforation that could require surgery, sedation-related changes in cardiopulmonary systems), benefits (identification and possible treatment of source of symptoms, exclusion of certain causes of symptoms), and alternatives (watchful waiting, radiographic imaging studies, empiric medical treatment) of upper endoscopy with possible esophageal dilatation (EGD +/- dil) were explained to patient in detail and she wishes to proceed.

## 2011-07-28 NOTE — Anesthesia Preprocedure Evaluation (Addendum)
Anesthesia Evaluation  Patient identified by MRN, date of birth, ID band Patient awake    Reviewed: Allergy & Precautions, H&P , NPO status , Patient's Chart, lab work & pertinent test results, reviewed documented beta blocker date and time   History of Anesthesia Complications (+) PONV  Airway Mallampati: II TM Distance: >3 FB Neck ROM: Full    Dental  (+) Teeth Intact and Dental Advisory Given   Pulmonary asthma ,  Rare use of inhaler breath sounds clear to auscultation        Cardiovascular negative cardio ROS  Rhythm:Regular Rate:Normal  Denies cardiac symptopms   Neuro/Psych negative neurological ROS  negative psych ROS   GI/Hepatic Neg liver ROS, Esophageal stricture/dysphagia   Endo/Other  negative endocrine ROS  Renal/GU negative Renal ROS  negative genitourinary   Musculoskeletal negative musculoskeletal ROS (+)   Abdominal   Peds negative pediatric ROS (+)  Hematology negative hematology ROS (+)   Anesthesia Other Findings   Reproductive/Obstetrics negative OB ROS                          Anesthesia Physical Anesthesia Plan  ASA: II  Anesthesia Plan: MAC   Post-op Pain Management:    Induction: Intravenous  Airway Management Planned: Mask  Additional Equipment:   Intra-op Plan:   Post-operative Plan:   Informed Consent: I have reviewed the patients History and Physical, chart, labs and discussed the procedure including the risks, benefits and alternatives for the proposed anesthesia with the patient or authorized representative who has indicated his/her understanding and acceptance.   Dental advisory given  Plan Discussed with: CRNA and Surgeon  Anesthesia Plan Comments:         Anesthesia Quick Evaluation

## 2011-07-28 NOTE — Op Note (Signed)
[facility] [address] [place], [place]  [address]  ENDOSCOPY PROCEDURE REPORT  PATIENT:  Allison, Serrano  MR#:  [id number] BIRTHDATE:  1980-05-11, 30 yrs. old  GENDER:  female  ENDOSCOPIST:  Willis Modena, MD Referred by:  Maryelizabeth Rowan, M.D.  PROCEDURE DATE:  07/28/2011 PROCEDURE:  EGD with balloon dilatation ASA CLASS:  Class II INDICATIONS:  dysphagia, esophageal stricture, eosinophilic esophagitis  MEDICATIONS:   Cetacaine spray x 2, MAC sedation, administered by CRNA  DESCRIPTION OF PROCEDURE:   After the risks benefits and alternatives of the procedure were thoroughly explained, informed consent was obtained.  The [id number] ([id number]) endoscope was introduced through the mouth and advanced to the second portion of the duodenum, without limitations.  The instrument was slowly withdrawn as the mucosa was fully examined.  <<PROCEDUREIMAGES>>  FINDINGS: Distal esophageal stricture, with mild LA-A esophagitis, again appreciated, but seemingly a bit more patent compared to last endoscopy few weeks ago.  Esophageal mucosal changes from eosinophilic esophagitis, unchanged.  Stomach, pylorus and duodenum to second portion normal.  Stricture dilated with TTS balloon (15mm, 60 seconds, mild-to-moderate resistance; 16.37mm, 60seconds, moderate resistance).  There was appropriate bloody show and expected mucosal disruption post-dilatation.  ENDOSCOPIC IMPRESSION:    1.  Distal esophageal stricture, dilated to 16.5 mm as above.  RECOMMENDATIONS:      1.  Watch for potential complications of procedure. 2.  Continue Nexium, Zantac, Singulair. 3.  Continue swallowed fluticasone and oral budesonide. 4.  Will see back in office in a couple weeks; will discuss case with tertiary center to see if there are any other pharmacologic options for her fairly recalcitrant Eosinophilic Esophagitis.  ______________________________ Willis Modena  CC:  n. eSIGNEDWillis Modena at 07/28/2011 08:54 AM  Lung, [name], [phone]

## 2011-07-28 NOTE — Discharge Instructions (Signed)
Endoscopy Care After Please read the instructions outlined below and refer to this sheet in the next few weeks. These discharge instructions provide you with general information on caring for yourself after you leave the hospital. Your doctor may also give you specific instructions. While your treatment has been planned according to the most current medical practices available, unavoidable complications occasionally occur. If you have any problems or questions after discharge, please call your doctor. HOME CARE INSTRUCTIONS Activity  You may resume your regular activity but move at a slower pace for the next 24 hours.   Take frequent rest periods for the next 24 hours.   Walking will help expel (get rid of) the air and reduce the bloated feeling in your abdomen.   No driving for 24 hours (because of the anesthesia (medicine) used during the test).   You may shower.   Do not sign any important legal documents or operate any machinery for 24 hours (because of the anesthesia used during the test).  Nutrition  Drink plenty of fluids.   You may resume your normal diet.   Begin with a light meal and progress to your normal diet.   Avoid alcoholic beverages for 24 hours or as instructed by your caregiver.  Medications You may resume your normal medications unless your caregiver tells you otherwise. What you can expect today  You may experience abdominal discomfort such as a feeling of fullness or "gas" pains.   You may experience a sore throat for 2 to 3 days. This is normal. Gargling with salt water may help this.  Follow-up Your doctor will discuss the results of your test with you. SEEK IMMEDIATE MEDICAL CARE IF:  You have excessive nausea (feeling sick to your stomach) and/or vomiting.   You have severe abdominal pain and distention (swelling).   You have trouble swallowing.   You have a temperature over 100 F (37.8 C).   You have rectal bleeding or vomiting of blood.    Document Released: 10/21/2003 Document Revised: 02/25/2011 Document Reviewed: 05/03/2007 [employer] Patient Information 2012 [employer]. 

## 2011-07-28 NOTE — Preoperative (Signed)
Beta Blockers   Reason not to administer Beta Blockers:Not Applicable 

## 2011-07-28 NOTE — Transfer of Care (Signed)
Immediate Anesthesia Transfer of Care Note  Patient: [name]  Procedure(s) Performed: Procedure(s) (LRB): ESOPHAGOGASTRODUODENOSCOPY (EGD) (N/A) BALLOON DILATION (N/A)  Patient Location: PACU  Anesthesia Type: MAC  Level of Consciousness: sedated  Airway & Oxygen Therapy: Patient Spontanous Breathing and Patient connected to face mask oxygen  Post-op Assessment: Report given to PACU RN and Post -op Vital signs reviewed and stable  Post vital signs: Reviewed and stable  Complications: No apparent anesthesia complications

## 2011-07-29 ENCOUNTER — Encounter (HOSPITAL_COMMUNITY): Payer: Self-pay | Admitting: Gastroenterology

## 2011-11-30 DIAGNOSIS — F32A Depression, unspecified: Secondary | ICD-10-CM | POA: Insufficient documentation

## 2011-11-30 DIAGNOSIS — G44009 Cluster headache syndrome, unspecified, not intractable: Secondary | ICD-10-CM | POA: Insufficient documentation

## 2011-11-30 DIAGNOSIS — F329 Major depressive disorder, single episode, unspecified: Secondary | ICD-10-CM | POA: Insufficient documentation

## 2012-10-04 ENCOUNTER — Emergency Department (HOSPITAL_COMMUNITY)
Admission: EM | Admit: 2012-10-04 | Discharge: 2012-10-04 | Disposition: A | Discharge status: Home / Self Care | Payer: BC Managed Care – PPO | Attending: Emergency Medicine | Admitting: Emergency Medicine

## 2012-10-04 ENCOUNTER — Encounter (HOSPITAL_COMMUNITY): Payer: Self-pay | Admitting: Emergency Medicine

## 2012-10-04 DIAGNOSIS — Z8701 Personal history of pneumonia (recurrent): Secondary | ICD-10-CM | POA: Insufficient documentation

## 2012-10-04 DIAGNOSIS — J45909 Unspecified asthma, uncomplicated: Secondary | ICD-10-CM | POA: Insufficient documentation

## 2012-10-04 DIAGNOSIS — Z79899 Other long term (current) drug therapy: Secondary | ICD-10-CM | POA: Insufficient documentation

## 2012-10-04 DIAGNOSIS — K219 Gastro-esophageal reflux disease without esophagitis: Secondary | ICD-10-CM | POA: Insufficient documentation

## 2012-10-04 DIAGNOSIS — Z8679 Personal history of other diseases of the circulatory system: Secondary | ICD-10-CM | POA: Insufficient documentation

## 2012-10-04 DIAGNOSIS — Z8719 Personal history of other diseases of the digestive system: Secondary | ICD-10-CM | POA: Insufficient documentation

## 2012-10-04 DIAGNOSIS — R51 Headache: Secondary | ICD-10-CM | POA: Insufficient documentation

## 2012-10-04 HISTORY — DX: Migraine, unspecified, not intractable, without status migrainosus: G43.909

## 2012-10-04 MED ORDER — DIPHENHYDRAMINE HCL 50 MG/ML IJ SOLN
25.0000 mg | Freq: Once | INTRAMUSCULAR | Status: AC
  Administered 2012-10-04: 25 mg via INTRAVENOUS
  Filled 2012-10-04: qty 1

## 2012-10-04 MED ORDER — METOCLOPRAMIDE HCL 5 MG/ML IJ SOLN
10.0000 mg | Freq: Once | INTRAMUSCULAR | Status: AC
  Administered 2012-10-04: 10 mg via INTRAVENOUS
  Filled 2012-10-04: qty 2

## 2012-10-04 NOTE — ED Provider Notes (Signed)
History    CSN: [id number] Arrival date & time 10/04/12  1154  First MD Initiated Contact with Patient 10/04/12 1206     Chief Complaint  Patient presents with  . Migraine    Patient is a 32 y.o. female presenting with migraines. The history is provided by the patient.  Migraine The current episode started 6 to 12 hours ago. The problem occurs constantly. The problem has been gradually worsening. Associated symptoms include headaches. Pertinent negatives include no chest pain, no abdominal pain and no shortness of breath. Nothing aggravates the symptoms. Nothing relieves the symptoms. She has tried rest for the symptoms. The treatment provided no relief.  pt reports h/o migraines on a yearly basis, this started this morning, similar to prior No fever/vomiting No falls/trauma No tick bites/rashes No focal weakness No slurred speech reported Past Medical History  Diagnosis Date  . PONV (postoperative nausea and vomiting)   . Asthma     hx  . Pneumonia     hx of pneumonia  . GERD (gastroesophageal reflux disease)   . H/O hiatal hernia   . Dysphagia   . Migraine    Past Surgical History  Procedure Laterality Date  . Esophageal dilation    . Cesarean section    . Esophagogastroduodenoscopy  06/16/2011    Procedure: ESOPHAGOGASTRODUODENOSCOPY (EGD);  Surgeon: Willis Modena, MD;  Location: [facility];  Service: Endoscopy;  Laterality: N/A;  . Balloon dilation  06/16/2011    Procedure: BALLOON DILATION;  Surgeon: Willis Modena, MD;  Location: [facility];  Service: Endoscopy;  Laterality: N/A;  . Balloon dilation  07/07/2011    Procedure: BALLOON DILATION;  Surgeon: Willis Modena, MD;  Location: [facility];  Service: Endoscopy;  Laterality: N/A;  . Esophagogastroduodenoscopy  07/28/2011    Procedure: ESOPHAGOGASTRODUODENOSCOPY (EGD);  Surgeon: Willis Modena, MD;  Location: [facility];  Service: Endoscopy;  Laterality: N/A;  . Balloon dilation  07/28/2011    Procedure:  BALLOON DILATION;  Surgeon: Willis Modena, MD;  Location: [facility];  Service: Endoscopy;  Laterality: N/A;   Family History  Problem Relation Age of Onset  . Malignant hyperthermia Neg Hx    History  Substance Use Topics  . Smoking status: Never Smoker   . Smokeless tobacco: Not on file  . Alcohol Use: No   OB History   Grav Para Term Preterm Abortions TAB SAB Ect Mult Living                 Review of Systems  Constitutional: Negative for fever.  Respiratory: Negative for shortness of breath.   Cardiovascular: Negative for chest pain.  Gastrointestinal: Negative for abdominal pain.  Skin: Negative for rash.  Neurological: Positive for headaches. Negative for facial asymmetry, speech difficulty and weakness.  All other systems reviewed and are negative.    Allergies  Codeine and Sucralfate  Home Medications   Current Outpatient Rx  Name  Route  Sig  Dispense  Refill  . esomeprazole (NEXIUM) 40 MG capsule   Oral   Take 40 mg by mouth 2 (two) times daily.         . fluticasone (FLOVENT HFA) 110 MCG/ACT inhaler   Inhalation   Inhale 1 puff into the lungs 2 (two) times daily as needed (for shortness of breath).           BP 110/79  Pulse 82  Temp(Src) 98 F (36.7 C) (Oral)  Resp 16  SpO2 100%  LMP 09/19/2012 Physical Exam CONSTITUTIONAL: Well developed/well  nourished HEAD: Normocephalic/atraumatic EYES: EOMI/PERRL, no nystagmus, normal fundoscopic exam  ENMT: Mucous membranes moist NECK: supple no meningeal signs, no bruits SPINE:entire spine nontender CV: S1/S2 noted, no murmurs/rubs/gallops noted LUNGS: Lungs are clear to auscultation bilaterally, no apparent distress ABDOMEN: soft, nontender, no rebound or guarding GU:no cva tenderness NEURO:Awake/alert, facies symmetric, no arm or leg drift is noted Cranial nerves 3/4/5/6/09/27/08/11/12 tested and intact Gait normal without ataxia No past pointing EXTREMITIES: pulses normal, full ROM SKIN:  warm, color normal, no rash PSYCH: no abnormalities of mood noted   ED Course  Procedures  MDM  Nursing notes including past medical history and social history reviewed and considered in documentation   Pt reports typical migraine She is in no distress and well appearing Will treat HA.  She will be safe for d/c home  Joya Gaskins, MD 10/04/12 1248

## 2012-10-04 NOTE — ED Notes (Signed)
Pt complains of migraine headache since 0300 this am

## 2013-03-22 DIAGNOSIS — R2231 Localized swelling, mass and lump, right upper limb: Secondary | ICD-10-CM

## 2013-03-22 HISTORY — DX: Localized swelling, mass and lump, right upper limb: R22.31

## 2013-04-13 ENCOUNTER — Encounter (HOSPITAL_BASED_OUTPATIENT_CLINIC_OR_DEPARTMENT_OTHER): Payer: Self-pay | Admitting: *Deleted

## 2013-04-13 ENCOUNTER — Other Ambulatory Visit: Payer: Self-pay | Admitting: Orthopedic Surgery

## 2013-04-16 ENCOUNTER — Encounter (HOSPITAL_BASED_OUTPATIENT_CLINIC_OR_DEPARTMENT_OTHER)
Admission: RE | Disposition: A | Discharge status: Home / Self Care | Payer: Self-pay | Source: Ambulatory Visit | Attending: Orthopedic Surgery

## 2013-04-16 ENCOUNTER — Ambulatory Visit (HOSPITAL_BASED_OUTPATIENT_CLINIC_OR_DEPARTMENT_OTHER)
Admission: RE | Admit: 2013-04-16 | Discharge: 2013-04-16 | Disposition: A | Discharge status: Home / Self Care | Payer: BC Managed Care – PPO | Source: Ambulatory Visit | Attending: Orthopedic Surgery | Admitting: Orthopedic Surgery

## 2013-04-16 ENCOUNTER — Ambulatory Visit (HOSPITAL_BASED_OUTPATIENT_CLINIC_OR_DEPARTMENT_OTHER): Payer: BC Managed Care – PPO | Admitting: *Deleted

## 2013-04-16 ENCOUNTER — Encounter (HOSPITAL_BASED_OUTPATIENT_CLINIC_OR_DEPARTMENT_OTHER): Payer: BC Managed Care – PPO | Admitting: *Deleted

## 2013-04-16 ENCOUNTER — Encounter (HOSPITAL_BASED_OUTPATIENT_CLINIC_OR_DEPARTMENT_OTHER): Payer: Self-pay | Admitting: *Deleted

## 2013-04-16 DIAGNOSIS — J45909 Unspecified asthma, uncomplicated: Secondary | ICD-10-CM | POA: Insufficient documentation

## 2013-04-16 DIAGNOSIS — K219 Gastro-esophageal reflux disease without esophagitis: Secondary | ICD-10-CM | POA: Insufficient documentation

## 2013-04-16 DIAGNOSIS — Z885 Allergy status to narcotic agent status: Secondary | ICD-10-CM | POA: Insufficient documentation

## 2013-04-16 HISTORY — DX: Localized swelling, mass and lump, right upper limb: R22.31

## 2013-04-16 HISTORY — DX: Personal history of other diseases of the digestive system: Z87.19

## 2013-04-16 HISTORY — PX: MASS EXCISION: SHX2000

## 2013-04-16 SURGERY — EXCISION MASS
Anesthesia: Monitor Anesthesia Care | Site: Finger | Laterality: Right

## 2013-04-16 MED ORDER — ONDANSETRON HCL 4 MG/2ML IJ SOLN: 4.0000 mg | Freq: Four times a day (QID) | INTRAMUSCULAR | Status: DC | PRN

## 2013-04-16 MED ORDER — LACTATED RINGERS IV SOLN
INTRAVENOUS | Status: DC
  Administered 2013-04-16: 08:00:00 via INTRAVENOUS

## 2013-04-16 MED ORDER — PROPOFOL 10 MG/ML IV EMUL
INTRAVENOUS | Status: AC
  Filled 2013-04-16: qty 50

## 2013-04-16 MED ORDER — BUPIVACAINE HCL (PF) 0.25 % IJ SOLN
INTRAMUSCULAR | Status: DC | PRN
  Administered 2013-04-16: 10 mL

## 2013-04-16 MED ORDER — BUPIVACAINE-EPINEPHRINE PF 0.25-1:200000 % IJ SOLN
INTRAMUSCULAR | Status: AC
  Filled 2013-04-16: qty 30

## 2013-04-16 MED ORDER — FENTANYL CITRATE 0.05 MG/ML IJ SOLN
INTRAMUSCULAR | Status: AC
  Filled 2013-04-16: qty 4

## 2013-04-16 MED ORDER — MIDAZOLAM HCL 2 MG/2ML IJ SOLN: 1.0000 mg | INTRAMUSCULAR | Status: DC | PRN

## 2013-04-16 MED ORDER — HYDROCODONE-ACETAMINOPHEN 5-325 MG PO TABS: ORAL_TABLET | ORAL | Status: DC

## 2013-04-16 MED ORDER — CEFAZOLIN SODIUM-DEXTROSE 2-3 GM-% IV SOLR
INTRAVENOUS | Status: DC | PRN
Start: 2013-04-16 — End: 2013-04-16
  Administered 2013-04-16: 2 g via INTRAVENOUS

## 2013-04-16 MED ORDER — MIDAZOLAM HCL 2 MG/2ML IJ SOLN
INTRAMUSCULAR | Status: AC
  Filled 2013-04-16: qty 2

## 2013-04-16 MED ORDER — FENTANYL CITRATE 0.05 MG/ML IJ SOLN: 25.0000 ug | INTRAMUSCULAR | Status: DC | PRN

## 2013-04-16 MED ORDER — FENTANYL CITRATE 0.05 MG/ML IJ SOLN: 50.0000 ug | INTRAMUSCULAR | Status: DC | PRN

## 2013-04-16 MED ORDER — ONDANSETRON HCL 4 MG/2ML IJ SOLN
INTRAMUSCULAR | Status: DC | PRN
  Administered 2013-04-16: 4 mg via INTRAVENOUS

## 2013-04-16 MED ORDER — LIDOCAINE HCL (PF) 1 % IJ SOLN
INTRAMUSCULAR | Status: AC
  Filled 2013-04-16: qty 5

## 2013-04-16 MED ORDER — PROPOFOL INFUSION 10 MG/ML OPTIME
INTRAVENOUS | Status: DC | PRN
  Administered 2013-04-16: 50 ug/kg/min via INTRAVENOUS

## 2013-04-16 MED ORDER — OXYCODONE HCL 5 MG/5ML PO SOLN: 5.0000 mg | Freq: Once | ORAL | Status: DC | PRN

## 2013-04-16 MED ORDER — MIDAZOLAM HCL 5 MG/5ML IJ SOLN
INTRAMUSCULAR | Status: DC | PRN
  Administered 2013-04-16: 2 mg via INTRAVENOUS

## 2013-04-16 MED ORDER — PROPOFOL 10 MG/ML IV BOLUS
INTRAVENOUS | Status: DC | PRN
  Administered 2013-04-16: 40 mg via INTRAVENOUS
  Administered 2013-04-16: 50 mg via INTRAVENOUS

## 2013-04-16 MED ORDER — LIDOCAINE HCL (CARDIAC) 20 MG/ML IV SOLN
INTRAVENOUS | Status: DC | PRN
  Administered 2013-04-16: 25 mg via INTRAVENOUS

## 2013-04-16 MED ORDER — BUPIVACAINE HCL (PF) 0.25 % IJ SOLN
INTRAMUSCULAR | Status: AC
  Filled 2013-04-16: qty 30

## 2013-04-16 MED ORDER — FENTANYL CITRATE 0.05 MG/ML IJ SOLN
INTRAMUSCULAR | Status: DC | PRN
  Administered 2013-04-16: 50 ug via INTRAVENOUS

## 2013-04-16 MED ORDER — OXYCODONE HCL 5 MG PO TABS: 5.0000 mg | ORAL_TABLET | Freq: Once | ORAL | Status: DC | PRN

## 2013-04-16 SURGICAL SUPPLY — 55 items
APL SKNCLS STERI-STRIP NONHPOA (GAUZE/BANDAGES/DRESSINGS)
BANDAGE COBAN STERILE 2 (GAUZE/BANDAGES/DRESSINGS) IMPLANT
BANDAGE ELASTIC 3 VELCRO ST LF (GAUZE/BANDAGES/DRESSINGS) IMPLANT
BANDAGE GAUZE STRT 1 STR LF (GAUZE/BANDAGES/DRESSINGS) ×2 IMPLANT
BENZOIN TINCTURE PRP APPL 2/3 (GAUZE/BANDAGES/DRESSINGS) IMPLANT
BLADE MINI RND TIP GREEN BEAV (BLADE) IMPLANT
BLADE SURG 15 STRL LF DISP TIS (BLADE) ×2 IMPLANT
BLADE SURG 15 STRL SS (BLADE) ×6
BNDG CMPR 9X4 STRL LF SNTH (GAUZE/BANDAGES/DRESSINGS) ×1
BNDG CMPR MD 5X2 ELC HKLP STRL (GAUZE/BANDAGES/DRESSINGS)
BNDG COHESIVE 1X5 TAN STRL LF (GAUZE/BANDAGES/DRESSINGS) ×2 IMPLANT
BNDG CONFORM 2 STRL LF (GAUZE/BANDAGES/DRESSINGS) IMPLANT
BNDG ELASTIC 2 VLCR STRL LF (GAUZE/BANDAGES/DRESSINGS) IMPLANT
BNDG ESMARK 4X9 LF (GAUZE/BANDAGES/DRESSINGS) ×2 IMPLANT
BNDG GAUZE ELAST 4 BULKY (GAUZE/BANDAGES/DRESSINGS) IMPLANT
BNDG PLASTER X FAST 3X3 WHT LF (CAST SUPPLIES) IMPLANT
BNDG PLSTR 9X3 FST ST WHT (CAST SUPPLIES)
CHLORAPREP W/TINT 26ML (MISCELLANEOUS) ×3 IMPLANT
CLOSURE WOUND 1/2 X4 (GAUZE/BANDAGES/DRESSINGS)
CORDS BIPOLAR (ELECTRODE) ×3 IMPLANT
COVER MAYO STAND STRL (DRAPES) ×3 IMPLANT
COVER TABLE BACK 60X90 (DRAPES) ×3 IMPLANT
CUFF TOURNIQUET SINGLE 18IN (TOURNIQUET CUFF) ×3 IMPLANT
DRAPE EXTREMITY T 121X128X90 (DRAPE) ×3 IMPLANT
DRAPE SURG 17X23 STRL (DRAPES) ×3 IMPLANT
GAUZE XEROFORM 1X8 LF (GAUZE/BANDAGES/DRESSINGS) ×3 IMPLANT
GLOVE BIO SURGEON STRL SZ7.5 (GLOVE) ×3 IMPLANT
GLOVE BIOGEL PI IND STRL 8 (GLOVE) ×1 IMPLANT
GLOVE BIOGEL PI INDICATOR 8 (GLOVE) ×2
GLOVE ECLIPSE 6.5 STRL STRAW (GLOVE) ×2 IMPLANT
GLOVE EXAM NITRILE MD LF STRL (GLOVE) ×2 IMPLANT
GOWN STRL REUS W/ TWL LRG LVL3 (GOWN DISPOSABLE) ×1 IMPLANT
GOWN STRL REUS W/TWL LRG LVL3 (GOWN DISPOSABLE) ×3
GOWN STRL REUS W/TWL XL LVL3 (GOWN DISPOSABLE) ×3 IMPLANT
NDL HYPO 25X1 1.5 SAFETY (NEEDLE) ×1 IMPLANT
NEEDLE HYPO 25X1 1.5 SAFETY (NEEDLE) ×3 IMPLANT
NS IRRIG 1000ML POUR BTL (IV SOLUTION) ×3 IMPLANT
PACK BASIN DAY SURGERY FS (CUSTOM PROCEDURE TRAY) ×3 IMPLANT
PAD CAST 3X4 CTTN HI CHSV (CAST SUPPLIES) IMPLANT
PAD CAST 4YDX4 CTTN HI CHSV (CAST SUPPLIES) IMPLANT
PADDING CAST ABS 4INX4YD NS (CAST SUPPLIES) ×2
PADDING CAST ABS COTTON 4X4 ST (CAST SUPPLIES) ×1 IMPLANT
PADDING CAST COTTON 3X4 STRL (CAST SUPPLIES)
PADDING CAST COTTON 4X4 STRL (CAST SUPPLIES)
SPONGE GAUZE 4X4 12PLY (GAUZE/BANDAGES/DRESSINGS) ×3 IMPLANT
STOCKINETTE 4X48 STRL (DRAPES) ×3 IMPLANT
STRIP CLOSURE SKIN 1/2X4 (GAUZE/BANDAGES/DRESSINGS) IMPLANT
SUT ETHILON 3 0 PS 1 (SUTURE) IMPLANT
SUT ETHILON 4 0 PS 2 18 (SUTURE) ×3 IMPLANT
SUT ETHILON 5 0 P 3 18 (SUTURE)
SUT NYLON ETHILON 5-0 P-3 1X18 (SUTURE) IMPLANT
SYR BULB 3OZ (MISCELLANEOUS) ×3 IMPLANT
SYR CONTROL 10ML LL (SYRINGE) ×3 IMPLANT
TOWEL OR 17X24 6PK STRL BLUE (TOWEL DISPOSABLE) ×6 IMPLANT
UNDERPAD 30X30 INCONTINENT (UNDERPADS AND DIAPERS) ×3 IMPLANT

## 2013-04-16 NOTE — Transfer of Care (Signed)
Immediate Anesthesia Transfer of Care Note  Patient: [name]  Procedure(s) Performed: Procedure(s): RIGHT INDEX EXCISION MASS (Right)  Patient Location: PACU  Anesthesia Type:MAC  Level of Consciousness: awake, alert  and oriented  Airway & Oxygen Therapy: Patient Spontanous Breathing and Patient connected to face mask oxygen  Post-op Assessment: Report given to PACU RN, Post -op Vital signs reviewed and stable and Patient moving all extremities  Post vital signs: Reviewed and stable  Complications: No apparent anesthesia complications

## 2013-04-16 NOTE — Anesthesia Postprocedure Evaluation (Signed)
Anesthesia Post Note  Patient: [name]  Procedure(s) Performed: Procedure(s) (LRB): RIGHT INDEX EXCISION MASS (Right)  Anesthesia type: MAC  Patient location: PACU  Post pain: Pain level controlled and Adequate analgesia  Post assessment: Post-op Vital signs reviewed, Patient's Cardiovascular Status Stable and Respiratory Function Stable  Last Vitals:  Filed Vitals:   04/16/13 1006  BP: 109/68  Pulse: 53  Temp: 36.7 C  Resp: 16    Post vital signs: Reviewed and stable  Level of consciousness: awake, alert  and oriented  Complications: No apparent anesthesia complications

## 2013-04-16 NOTE — Op Note (Signed)
[id number] 

## 2013-04-16 NOTE — Brief Op Note (Signed)
04/16/2013  9:10 AM  PATIENT:  Gianina R Lung  33 y.o. female  PRE-OPERATIVE DIAGNOSIS:  RIGHT INDEX MASS  POST-OPERATIVE DIAGNOSIS:  RIGHT INDEX MASS  PROCEDURE:  Procedure(s): RIGHT INDEX EXCISION MASS (Right)  SURGEON:  Surgeon(s) and Role:    * Tami RibasKevin R Kianah Harries, MD - Primary  PHYSICIAN ASSISTANT:   ASSISTANTS: none   ANESTHESIA:   local and MAC  EBL:  Total I/O In: 200 [I.V.:200] Out: -   BLOOD ADMINISTERED:none  DRAINS: none   LOCAL MEDICATIONS USED:  MARCAINE     SPECIMEN:  Source of Specimen:  right index finger  DISPOSITION OF SPECIMEN:  PATHOLOGY  COUNTS:  YES  TOURNIQUET:   Total Tourniquet Time Documented: Upper Arm (Right) - 17 minutes Total: Upper Arm (Right) - 17 minutes   DICTATION: .Other Dictation: Dictation Number [id number]  PLAN OF CARE: Discharge to home after PACU  PATIENT DISPOSITION:  PACU - hemodynamically stable.

## 2013-04-16 NOTE — Discharge Instructions (Addendum)

## 2013-04-16 NOTE — Anesthesia Preprocedure Evaluation (Signed)
Anesthesia Evaluation  Patient identified by MRN, date of birth, ID band Patient awake    Reviewed: Allergy & Precautions, H&P , NPO status , Patient's Chart, lab work & pertinent test results  History of Anesthesia Complications (+) PONV  Airway Mallampati: II  Neck ROM: full    Dental   Pulmonary asthma ,          Cardiovascular negative cardio ROS      Neuro/Psych  Headaches,    GI/Hepatic hiatal hernia, GERD-  ,H/o esophageal stricture s/p multiple dilations.   Endo/Other    Renal/GU      Musculoskeletal   Abdominal   Peds  Hematology   Anesthesia Other Findings   Reproductive/Obstetrics                           Anesthesia Physical Anesthesia Plan  ASA: II  Anesthesia Plan: MAC   Post-op Pain Management:    Induction: Intravenous  Airway Management Planned: Simple Face Mask  Additional Equipment:   Intra-op Plan:   Post-operative Plan:   Informed Consent: I have reviewed the patients History and Physical, chart, labs and discussed the procedure including the risks, benefits and alternatives for the proposed anesthesia with the patient or authorized representative who has indicated his/her understanding and acceptance.     Plan Discussed with: CRNA, Anesthesiologist and Surgeon  Anesthesia Plan Comments:         Anesthesia Quick Evaluation

## 2013-04-16 NOTE — H&P (Signed)
[name] is an 33 y.o. female.   Chief Complaint: right index finger mass HPI: 33 yo rhd female notes white spot on right index finger x 2 months.  This has been lanced and returned.  It is painful to palpation.  No injury noted.  She wishes to have it removed.  Past Medical History  Diagnosis Date  . GERD (gastroesophageal reflux disease)   . H/O hiatal hernia   . PONV (postoperative nausea and vomiting)     nausea only  . Migraine   . Asthma     prn inhaler  . History of esophageal stricture     multiple esophageal dilations - states x 12  . Finger mass, right 03/2013    index finger    Past Surgical History  Procedure Laterality Date  . Esophagogastroduodenoscopy  07/07/2011  . Cesarean section  1999; 02/07/2003  . Esophagogastroduodenoscopy  06/16/2011    Procedure: ESOPHAGOGASTRODUODENOSCOPY (EGD);  Surgeon: Willis Modena, MD;  Location: [facility];  Service: Endoscopy;  Laterality: N/A;  . Balloon dilation  06/16/2011    Procedure: BALLOON DILATION;  Surgeon: Willis Modena, MD;  Location: [facility];  Service: Endoscopy;  Laterality: N/A;  . Balloon dilation  07/07/2011    Procedure: BALLOON DILATION;  Surgeon: Willis Modena, MD;  Location: [facility];  Service: Endoscopy;  Laterality: N/A;  . Esophagogastroduodenoscopy  07/28/2011    Procedure: ESOPHAGOGASTRODUODENOSCOPY (EGD);  Surgeon: Willis Modena, MD;  Location: [facility];  Service: Endoscopy;  Laterality: N/A;  . Balloon dilation  07/28/2011    Procedure: BALLOON DILATION;  Surgeon: Willis Modena, MD;  Location: [facility];  Service: Endoscopy;  Laterality: N/A;  . Finger mass excision Left     index finger    Family History  Problem Relation Age of Onset  . Anesthesia problems Mother     post-op nausea   Social History:  reports that she has never smoked. She has never used smokeless tobacco. She reports that she does not drink alcohol or use illicit drugs.  Allergies:  Allergies  Allergen  Reactions  . Codeine Nausea And Vomiting  . Sucralfate Nausea And Vomiting    Medications Prior to Admission  Medication Sig Dispense Refill  . esomeprazole (NEXIUM) 40 MG capsule Take 40 mg by mouth 2 (two) times daily.      Marland Kitchen albuterol (PROVENTIL HFA;VENTOLIN HFA) 108 (90 BASE) MCG/ACT inhaler Inhale into the lungs every 6 (six) hours as needed for wheezing or shortness of breath.      . rizatriptan (MAXALT) 10 MG tablet Take 10 mg by mouth as needed for migraine. May repeat in 2 hours if needed        No results found for this or any previous visit (from the past 48 hour(s)).  No results found.   A comprehensive review of systems was negative except for: Respiratory: positive for asthma Neurological: positive for headaches  Blood pressure 120/76, pulse 66, temperature 98.2 F (36.8 C), temperature source Oral, resp. rate 16, height 5\' 1"  (1.549 m), weight 143 lb 6 oz (65.034 kg), last menstrual period 03/22/2013, SpO2 99.00%.  General appearance: alert, cooperative and appears stated age Head: Normocephalic, without obvious abnormality, atraumatic Neck: supple, symmetrical, trachea midline Resp: clear to auscultation bilaterally Cardio: regular rate and rhythm GI: non tender Extremities: intact sensation and capillary refill all digits.  +epl/fpl/io.  right index with white lesion volarly.  ttp.  no erythema.  no wounds. Pulses: 2+ and symmetric Skin: Skin color, texture, turgor normal.  No rashes or lesions Neurologic: Grossly normal Incision/Wound: none  Assessment/Plan Right index finger mass, possibly inclusion cyst.  Non operative and operative treatment options were discussed with the patient and patient wishes to proceed with operative treatment. Risks, benefits, and alternatives of surgery were discussed and the patient agrees with the plan of care.   Makenley Shimp R 04/16/2013, 8:23 AM

## 2013-04-16 NOTE — Anesthesia Procedure Notes (Signed)
Procedure Name: MAC Date/Time: 04/16/2013 8:40 AM Performed by: Tami RibasKUZMA, KEVIN R Pre-anesthesia Checklist: Patient identified, Emergency Drugs available, Suction available and Patient being monitored Patient Re-evaluated:Patient Re-evaluated prior to inductionOxygen Delivery Method: Simple face mask Preoxygenation: Pre-oxygenation with 100% oxygen Intubation Type: IV induction Ventilation: Mask ventilation without difficulty Dental Injury: Teeth and Oropharynx as per pre-operative assessment

## 2013-04-17 NOTE — Op Note (Signed)
NAME:  LUNG,                        ACCOUNT NO.:  [id number]  MEDICAL RECORD NO.:  [id number]  LOCATION:                                 FACILITY:  PHYSICIAN:  Betha LoaKevin Martin Smeal, MD             DATE OF BIRTH:  DATE OF PROCEDURE:  04/16/2013 DATE OF DISCHARGE:                              OPERATIVE REPORT   PREOPERATIVE DIAGNOSIS:  Right index finger mass.  POSTOPERATIVE DIAGNOSIS:  Right index finger mass.  PROCEDURE:  Excision of 1 mm mass, right index finger.  SURGEON:  Betha LoaKevin Rahaf Carbonell, MD  ASSISTANT:  None.  ANESTHESIA:  MAC with local.  IV FLUIDS:  Per Anesthesia flow sheet.  ESTIMATED BLOOD LOSS:  Minimal.  COMPLICATIONS:  None.  SPECIMENS:  Left index finger mass to pathology.  TOURNIQUET TIME:  16 minutes.  DISPOSITION:  Stable to PACU.  INDICATIONS:  Mr. Melvyn NethLung is a 33 year old female, who has noted a mass in the volar aspect of her right index finger for approximately 2 months. There is a whitish speck on the skin.  It is very painful to touch for her.  She wished to have it excised.  Risks, benefits, alternatives of the surgery were discussed including the risk of blood loss, infection, damage to nerves, vessels, tendons, ligaments, bone; failure of surgery; need for additional surgery, complications with wound healing, continued pain, recurrence of the mass and painful scar.  She voiced understanding of these risks and elected to proceed.  OPERATIVE COURSE:  After being identified preoperatively by myself, the patient and I agreed upon procedure and site of procedure.  Surgical site was marked.  The risks, benefits, and alternatives of surgery were reviewed and he wished to proceed.  Surgical consent had been signed. She was given IV Ancef as preoperative antibiotic prophylaxis.  She was transferred to the operating room, placed on the operative table in supine position with the right upper extremity on arm board.  MAC anesthesia was induced by the  anesthesiologist.  A digital block was performed with 10 mL of 0.25% plain Marcaine.  The right upper extremity was prepped and draped in normal sterile orthopedic fashion.  Surgical pause was performed between surgeons, Anesthesia, operating staff, and all were in agreement as to the patient, procedure, and site of procedure.  Tourniquet at the proximal aspect of the extremity was inflated to 250 mmHg after exsanguination of the limb with an Esmarch bandage.  A Brunner-type incision was made at the DIP joint of the right index finger and carried into subcutaneous tissues by spreading technique.  The mass was identified.  It was very firm and white in coloration.  It was not fluid filled.  It was excised in its entirety and sent to Pathology for examination.  The white speck on the skin was able to be ellipsed out with a small piece of skin.  The wound was copiously irrigated with sterile saline.  It was able to be closed with 4-0 nylon in a horizontal mattress fashion.  There was good apposition of skin edges.  The DIP was able to be fully extended.  The flexor tendon had been visualized and was not involved in the mass.  The wound was dressed with sterile Xeroform, 4 x 4 and wrapped with a Kling and a Coban dressing lightly.  The tourniquet was deflated at 16 minutes. Fingertips were pink with brisk capillary refill after deflation of tourniquet.  Operative drapes were broken down.  The patient was awoken from anesthesia safely.  She was transferred back to stretcher and taken to PACU in stable condition.  I will see her back in the office in 1 week for postoperative followup.  I will give her Norco 5/325, 1-2 p.o. q.6 hours p.r.n. pain, dispensed #30.  She has had this in the past without issues.     Betha Loa, MD     KK/MEDQ  D:  04/16/2013  T:  04/16/2013  Job:  [id number]

## 2013-04-19 ENCOUNTER — Encounter (HOSPITAL_BASED_OUTPATIENT_CLINIC_OR_DEPARTMENT_OTHER): Payer: Self-pay | Admitting: Orthopedic Surgery

## 2014-04-22 ENCOUNTER — Encounter (HOSPITAL_COMMUNITY): Payer: Self-pay

## 2014-04-22 ENCOUNTER — Emergency Department (HOSPITAL_COMMUNITY)
Admission: EM | Admit: 2014-04-22 | Discharge: 2014-04-22 | Disposition: A | Discharge status: Home / Self Care | Payer: Medicaid Other | Source: Home / Self Care | Attending: Emergency Medicine | Admitting: Emergency Medicine

## 2014-04-22 DIAGNOSIS — R05 Cough: Secondary | ICD-10-CM | POA: Diagnosis not present

## 2014-04-22 DIAGNOSIS — J00 Acute nasopharyngitis [common cold]: Secondary | ICD-10-CM

## 2014-04-22 DIAGNOSIS — R059 Cough, unspecified: Secondary | ICD-10-CM

## 2014-04-22 LAB — POCT RAPID STREP A: Streptococcus, Group A Screen (Direct): NEGATIVE

## 2014-04-22 MED ORDER — HYDROCOD POLST-CHLORPHEN POLST 10-8 MG/5ML PO LQCR: 5.0000 mL | Freq: Two times a day (BID) | ORAL | Status: DC | PRN

## 2014-04-22 NOTE — ED Provider Notes (Signed)
CSN: [id number]     Arrival date & time 04/22/14  [id number] History   First MD Initiated Contact with Patient 04/22/14 [phone]     Chief Complaint  Patient presents with  . Cough   (Consider location/radiation/quality/duration/timing/severity/associated sxs/prior Treatment) HPI         34 year old female presents for evaluation of sore throat and cough. This started approximately 4 days ago. This started after she was exposed to a child with similar symptoms. Sore throat is gradually worsening. No fever, chills, NVD, chest pain, shortness of breath. No recent travel. Over-the-counter medications are not helping.  Past Medical History  Diagnosis Date  . GERD (gastroesophageal reflux disease)   . H/O hiatal hernia   . PONV (postoperative nausea and vomiting)     nausea only  . Migraine   . Asthma     prn inhaler  . History of esophageal stricture     multiple esophageal dilations - states x 12  . Finger mass, right 03/2013    index finger   Past Surgical History  Procedure Laterality Date  . Esophagogastroduodenoscopy  07/07/2011  . Cesarean section  1999; 02/07/2003  . Esophagogastroduodenoscopy  06/16/2011    Procedure: ESOPHAGOGASTRODUODENOSCOPY (EGD);  Surgeon: Willis ModenaWilliam Outlaw, MD;  [facility];  Service: Endoscopy;  Laterality: N/A;  . Balloon dilation  06/16/2011    Procedure: BALLOON DILATION;  Surgeon: Willis ModenaWilliam Outlaw, MD;  Location: [facility];  Service: Endoscopy;  Laterality: N/A;  . Balloon dilation  07/07/2011    Procedure: BALLOON DILATION;  Surgeon: Willis ModenaWilliam Outlaw, MD;  Location: [facility];  Service: Endoscopy;  Laterality: N/A;  . Esophagogastroduodenoscopy  07/28/2011    Procedure: ESOPHAGOGASTRODUODENOSCOPY (EGD);  Surgeon: Willis ModenaWilliam Outlaw, MD;  [facility];  Service: Endoscopy;  Laterality: N/A;  . Balloon dilation  07/28/2011    Procedure: BALLOON DILATION;  Surgeon: Willis ModenaWilliam Outlaw, MD;  Location: [facility];  Service: Endoscopy;  Laterality: N/A;  .  Finger mass excision Left     index finger  . Mass excision Right 04/16/2013    Procedure: RIGHT INDEX EXCISION MASS;  Surgeon: Tami RibasKevin R Kuzma, MD;  Location: [facility];  Service: Orthopedics;  Laterality: Right;   Family History  Problem Relation Age of Onset  . Anesthesia problems Mother     post-op nausea   History  Substance Use Topics  . Smoking status: Never Smoker   . Smokeless tobacco: Never Used  . Alcohol Use: No   OB History    No data available     Review of Systems  Constitutional: Positive for fatigue. Negative for fever and chills.  HENT: Positive for congestion and sore throat. Negative for ear pain, rhinorrhea and sinus pressure.   Respiratory: Positive for cough. Negative for chest tightness, shortness of breath and wheezing.   Cardiovascular: Negative for chest pain.  Gastrointestinal: Negative for nausea, vomiting, abdominal pain and diarrhea.  All other systems reviewed and are negative.   Allergies  Codeine and Sucralfate  Home Medications   Prior to Admission medications   Medication Sig Start Date End Date Taking? Authorizing Provider  albuterol (PROVENTIL HFA;VENTOLIN HFA) 108 (90 BASE) MCG/ACT inhaler Inhale into the lungs every 6 (six) hours as needed for wheezing or shortness of breath.    Historical Provider, MD  chlorpheniramine-HYDROcodone (TUSSIONEX PENNKINETIC ER) 10-8 MG/5ML LQCR Take 5 mLs by mouth every 12 (twelve) hours as needed for cough. 04/22/14   Graylon GoodZachary H Flay Ghosh, PA-C  esomeprazole (NEXIUM) 40 MG capsule Take 40 mg  by mouth 2 (two) times daily.    Historical Provider, MD  HYDROcodone-acetaminophen [facility]) 5-325 MG per tablet 1-2 tabs po q6 hours prn pain 04/16/13   Betha Loa, MD  rizatriptan (MAXALT) 10 MG tablet Take 10 mg by mouth as needed for migraine. May repeat in 2 hours if needed    Historical Provider, MD   BP 123/79 mmHg  Pulse 76  Temp(Src) 98.3 F (36.8 C) (Oral)  Resp 18  Ht  (1.549 m)  Wt 130 lb  (58.968 kg)  BMI 24.58 kg/m2  SpO2 96% Physical Exam  Constitutional: She is oriented to person, place, and time. Vital signs are normal. She appears well-developed and well-nourished. No distress.  HENT:  Head: Normocephalic and atraumatic.  Right Ear: External ear normal.  Left Ear: External ear normal.  Nose: Nose normal.  Mouth/Throat: Posterior oropharyngeal erythema (mild) present. No oropharyngeal exudate.  Eyes: Conjunctivae are normal. Right eye exhibits no discharge. Left eye exhibits no discharge.  Neck: Normal range of motion. Neck supple.  Cardiovascular: Normal rate, regular rhythm and normal heart sounds.   Pulmonary/Chest: Effort normal and breath sounds normal. No respiratory distress.  Lymphadenopathy:    She has no cervical adenopathy.  Neurological: She is alert and oriented to person, place, and time. She has normal strength. Coordination normal.  Skin: Skin is warm and dry. No rash noted. She is not diaphoretic.  Psychiatric: She has a normal mood and affect. Judgment normal.  Nursing note and vitals reviewed.   ED Course  Procedures (including critical care time) Labs Review Labs Reviewed  POCT RAPID STREP A ([facility]ARE ONLY)    Imaging Review No results found.   MDM   1. Acute nasopharyngitis (common cold)   2. Cough    Treat symptomatically, follow-up when necessary if no improvement in a week   Meds ordered this encounter  Medications  . chlorpheniramine-HYDROcodone (TUSSIONEX PENNKINETIC ER) 10-8 MG/5ML LQCR    Sig: Take 5 mLs by mouth every 12 (twelve) hours as needed for cough.    Dispense:  115 mL    Refill:  0       Graylon Good, PA-C 04/22/14 1031

## 2014-04-22 NOTE — Discharge Instructions (Signed)
Cough, Adult  A cough is a reflex that helps clear your throat and airways. It can help heal the body or may be a reaction to an irritated airway. A cough may only last 2 or 3 weeks (acute) or may last more than 8 weeks (chronic).  CAUSES Acute cough:  Viral or bacterial infections. Chronic cough:  Infections.  Allergies.  Asthma.  Post-nasal drip.  Smoking.  Heartburn or acid reflux.  Some medicines.  Chronic lung problems (COPD).  Cancer. SYMPTOMS   Cough.  Fever.  Chest pain.  Increased breathing rate.  High-pitched whistling sound when breathing (wheezing).  Colored mucus that you cough up (sputum). TREATMENT   A bacterial cough may be treated with antibiotic medicine.  A viral cough must run its course and will not respond to antibiotics.  Your caregiver may recommend other treatments if you have a chronic cough. HOME CARE INSTRUCTIONS   Only take over-the-counter or prescription medicines for pain, discomfort, or fever as directed by your caregiver. Use cough suppressants only as directed by your caregiver.  Use a cold steam vaporizer or humidifier in your bedroom or home to help loosen secretions.  Sleep in a semi-upright position if your cough is worse at night.  Rest as needed.  Stop smoking if you smoke. SEEK IMMEDIATE MEDICAL CARE IF:   You have pus in your sputum.  Your cough starts to worsen.  You cannot control your cough with suppressants and are losing sleep.  You begin coughing up blood.  You have difficulty breathing.  You develop pain which is getting worse or is uncontrolled with medicine.  You have a fever. MAKE SURE YOU:   Understand these instructions.  Will watch your condition.  Will get help right away if you are not doing well or get worse. Document Released: 09/04/2010 Document Revised: 05/31/2011 Document Reviewed: 09/04/2010 [facility] Patient Information 2015 [place], [place]. This information is not intended  to replace advice given to you by your health care provider. Make sure you discuss any questions you have with your health care provider.  Antibiotic Resistance Antibiotics are drugs. They fight infections caused by bacteria. Antibiotics greatly reduce illness and death from infectious diseases. Over time, the bacteria that antibiotics once controlled are much harder to kill. CAUSES  Antibiotic resistance occurs when bacteria change in some way. These changes can lessen the abilities of drugs designed to cure infections. The overuse of antibiotics can cause antibiotic resistance. Almost all important bacterial infections in the world are becoming resistant to drugs. Antibiotic resistance has been called one of the world's most pressing public health problems.  Antibiotics should be used to treat bacterial infections. But they are not effective against viral infections. These include the common cold, most sore throats, and the flu. Smart use of antibiotics will control the spread of resistance.  TREATMENT   Only use antibiotics as prescribed by your caregiver.  Talk with your caregiver about antibiotic resistance.  Ask what else you can do to feel better.  Do not take an antibiotic for a viral infection. This could be a cold, cough, or the flu.  Do not save some of your antibiotic for the next time you get sick.  Take an antibiotic exactly as the caregiver tells you.  Do not take an antibiotic that is prescribed for someone else.  Use the antibiotic as directed. Take the correct dose at the scheduled time. SEEK MEDICAL CARE IF:  You react to the antibiotic with:  A rash.  Itching.  An upset stomach. Document Released: 05/29/2002 Document Revised: 07/23/2013 Document Reviewed: 01/01/2008 [facility] Patient Information 2015 [place], [place]. This information is not intended to replace advice given to you by your health care provider. Make sure you discuss any questions you have with your  health care provider.  Pharyngitis Pharyngitis is redness, pain, and swelling (inflammation) of your pharynx.  CAUSES  Pharyngitis is usually caused by infection. Most of the time, these infections are from viruses (viral) and are part of a cold. However, sometimes pharyngitis is caused by bacteria (bacterial). Pharyngitis can also be caused by allergies. Viral pharyngitis may be spread from person to person by coughing, sneezing, and personal items or utensils (cups, forks, spoons, toothbrushes). Bacterial pharyngitis may be spread from person to person by more intimate contact, such as kissing.  SIGNS AND SYMPTOMS  Symptoms of pharyngitis include:   Sore throat.   Tiredness (fatigue).   Low-grade fever.   Headache.  Joint pain and muscle aches.  Skin rashes.  Swollen lymph nodes.  Plaque-like film on throat or tonsils (often seen with bacterial pharyngitis). DIAGNOSIS  Your health care provider will ask you questions about your illness and your symptoms. Your medical history, along with a physical exam, is often all that is needed to diagnose pharyngitis. Sometimes, a rapid strep test is done. Other lab tests may also be done, depending on the suspected cause.  TREATMENT  Viral pharyngitis will usually get better in 3-4 days without the use of medicine. Bacterial pharyngitis is treated with medicines that kill germs (antibiotics).  HOME CARE INSTRUCTIONS   Drink enough water and fluids to keep your urine clear or pale yellow.   Only take over-the-counter or prescription medicines as directed by your health care provider:   If you are prescribed antibiotics, make sure you finish them even if you start to feel better.   Do not take aspirin.   Get lots of rest.   Gargle with 8 oz of salt water ( tsp of salt per 1 qt of water) as often as every 1-2 hours to soothe your throat.   Throat lozenges (if you are not at risk for choking) or sprays may be used to soothe  your throat. SEEK MEDICAL CARE IF:   You have large, tender lumps in your neck.  You have a rash.  You cough up green, yellow-brown, or bloody spit. SEEK IMMEDIATE MEDICAL CARE IF:   Your neck becomes stiff.  You drool or are unable to swallow liquids.  You vomit or are unable to keep medicines or liquids down.  You have severe pain that does not go away with the use of recommended medicines.  You have trouble breathing (not caused by a stuffy nose). MAKE SURE YOU:   Understand these instructions.  Will watch your condition.  Will get help right away if you are not doing well or get worse. Document Released: 03/08/2005 Document Revised: 12/27/2012 Document Reviewed: 11/13/2012 [facility] Patient Information 2015 [place], [place]. This information is not intended to replace advice given to you by your health care provider. Make sure you discuss any questions you have with your health care provider.  Upper Respiratory Infection, Adult An upper respiratory infection (URI) is also sometimes known as the common cold. The upper respiratory tract includes the nose, sinuses, throat, trachea, and bronchi. Bronchi are the airways leading to the lungs. Most people improve within 1 week, but symptoms can last up to 2 weeks. A residual cough may last even longer.  CAUSES Many different viruses can infect the tissues lining the upper respiratory tract. The tissues become irritated and inflamed and often become very moist. Mucus production is also common. A cold is contagious. You can easily spread the virus to others by oral contact. This includes kissing, sharing a glass, coughing, or sneezing. Touching your mouth or nose and then touching a surface, which is then touched by another person, can also spread the virus. SYMPTOMS  Symptoms typically develop 1 to 3 days after you come in contact with a cold virus. Symptoms vary from person to person. They may include:  Runny  nose.  Sneezing.  Nasal congestion.  Sinus irritation.  Sore throat.  Loss of voice (laryngitis).  Cough.  Fatigue.  Muscle aches.  Loss of appetite.  Headache.  Low-grade fever. DIAGNOSIS  You might diagnose your own cold based on familiar symptoms, since most people get a cold 2 to 3 times a year. Your caregiver can confirm this based on your exam. Most importantly, your caregiver can check that your symptoms are not due to another disease such as strep throat, sinusitis, pneumonia, asthma, or epiglottitis. Blood tests, throat tests, and X-rays are not necessary to diagnose a common cold, but they may sometimes be helpful in excluding other more serious diseases. Your caregiver will decide if any further tests are required. RISKS AND COMPLICATIONS  You may be at risk for a more severe case of the common cold if you smoke cigarettes, have chronic heart disease (such as heart failure) or lung disease (such as asthma), or if you have a weakened immune system. The very young and very old are also at risk for more serious infections. Bacterial sinusitis, middle ear infections, and bacterial pneumonia can complicate the common cold. The common cold can worsen asthma and chronic obstructive pulmonary disease (COPD). Sometimes, these complications can require emergency medical care and may be life-threatening. PREVENTION  The best way to protect against getting a cold is to practice good hygiene. Avoid oral or hand contact with people with cold symptoms. Wash your hands often if contact occurs. There is no clear evidence that vitamin C, vitamin E, echinacea, or exercise reduces the chance of developing a cold. However, it is always recommended to get plenty of rest and practice good nutrition. TREATMENT  Treatment is directed at relieving symptoms. There is no cure. Antibiotics are not effective, because the infection is caused by a virus, not by bacteria. Treatment may include:  Increased  fluid intake. Sports drinks offer valuable electrolytes, sugars, and fluids.  Breathing heated mist or steam (vaporizer or shower).  Eating chicken soup or other clear broths, and maintaining good nutrition.  Getting plenty of rest.  Using gargles or lozenges for comfort.  Controlling fevers with ibuprofen or acetaminophen as directed by your caregiver.  Increasing usage of your inhaler if you have asthma. Zinc gel and zinc lozenges, taken in the first 24 hours of the common cold, can shorten the duration and lessen the severity of symptoms. Pain medicines may help with fever, muscle aches, and throat pain. A variety of non-prescription medicines are available to treat congestion and runny nose. Your caregiver can make recommendations and may suggest nasal or lung inhalers for other symptoms.  HOME CARE INSTRUCTIONS   Only take over-the-counter or prescription medicines for pain, discomfort, or fever as directed by your caregiver.  Use a warm mist humidifier or inhale steam from a shower to increase air moisture. This may keep secretions moist and make  it easier to breathe.  Drink enough water and fluids to keep your urine clear or pale yellow.  Rest as needed.  Return to work when your temperature has returned to normal or as your caregiver advises. You may need to stay home longer to avoid infecting others. You can also use a face mask and careful hand washing to prevent spread of the virus. SEEK MEDICAL CARE IF:   After the first few days, you feel you are getting worse rather than better.  You need your caregiver's advice about medicines to control symptoms.  You develop chills, worsening shortness of breath, or brown or red sputum. These may be signs of pneumonia.  You develop yellow or brown nasal discharge or pain in the face, especially when you bend forward. These may be signs of sinusitis.  You develop a fever, swollen neck glands, pain with swallowing, or white areas in  the back of your throat. These may be signs of strep throat. SEEK IMMEDIATE MEDICAL CARE IF:   You have a fever.  You develop severe or persistent headache, ear pain, sinus pain, or chest pain.  You develop wheezing, a prolonged cough, cough up blood, or have a change in your usual mucus (if you have chronic lung disease).  You develop sore muscles or a stiff neck. Document Released: 09/01/2000 Document Revised: 05/31/2011 Document Reviewed: 06/13/2013 [facility] Patient Information 2015 [place], [place]. This information is not intended to replace advice given to you by your health care provider. Make sure you discuss any questions you have with your health care provider.

## 2014-04-22 NOTE — ED Notes (Signed)
C/o sore throat, cough onset 1-29

## 2014-04-24 LAB — CULTURE, GROUP A STREP

## 2014-04-26 ENCOUNTER — Encounter (HOSPITAL_COMMUNITY): Payer: Self-pay | Admitting: Emergency Medicine

## 2014-04-26 ENCOUNTER — Emergency Department (HOSPITAL_COMMUNITY)
Admission: EM | Admit: 2014-04-26 | Discharge: 2014-04-26 | Disposition: A | Discharge status: Home / Self Care | Payer: Medicaid Other | Source: Home / Self Care | Attending: Family Medicine | Admitting: Family Medicine

## 2014-04-26 DIAGNOSIS — J069 Acute upper respiratory infection, unspecified: Secondary | ICD-10-CM

## 2014-04-26 LAB — POCT RAPID STREP A: Streptococcus, Group A Screen (Direct): NEGATIVE

## 2014-04-26 MED ORDER — METHYLPREDNISOLONE ACETATE 80 MG/ML IJ SUSP
INTRAMUSCULAR | Status: AC
Start: 2014-04-26 — End: 2014-04-26
  Filled 2014-04-26: qty 1

## 2014-04-26 MED ORDER — MINOCYCLINE HCL 100 MG PO CAPS: 100.0000 mg | ORAL_CAPSULE | Freq: Two times a day (BID) | ORAL | Status: DC

## 2014-04-26 MED ORDER — IPRATROPIUM BROMIDE 0.06 % NA SOLN: 2.0000 | Freq: Four times a day (QID) | NASAL | Status: DC

## 2014-04-26 MED ORDER — METHYLPREDNISOLONE ACETATE PF 80 MG/ML IJ SUSP
80.0000 mg | Freq: Once | INTRAMUSCULAR | Status: AC
  Administered 2014-04-26: 80 mg via INTRAMUSCULAR

## 2014-04-26 NOTE — ED Notes (Signed)
Pt was here on Monday and is not feeling better.  She states the left side of her neck and throat feel very swollen.  She is congested as well.  She denies fever.  She has tried Mucinex and the cough medicine prescribed here with little relief.

## 2014-04-26 NOTE — ED Provider Notes (Signed)
CSN: [id number]     Arrival date & time 04/26/14  [id number] History   First MD Initiated Contact with Patient 04/26/14 [phone]     Chief Complaint  Patient presents with  . Sore Throat   (Consider location/radiation/quality/duration/timing/severity/associated sxs/prior Treatment) Patient is a 34 y.o. female presenting with pharyngitis. The history is provided by the patient.  Sore Throat This is a new problem. The current episode started more than 2 days ago ([facility] sx including st ,not any better.). The problem has not changed since onset.Pertinent negatives include no chest pain, no abdominal pain and no shortness of breath.    Past Medical History  Diagnosis Date  . GERD (gastroesophageal reflux disease)   . H/O hiatal hernia   . PONV (postoperative nausea and vomiting)     nausea only  . Migraine   . Asthma     prn inhaler  . History of esophageal stricture     multiple esophageal dilations - states x 12  . Finger mass, right 03/2013    index finger   Past Surgical History  Procedure Laterality Date  . Esophagogastroduodenoscopy  07/07/2011  . Cesarean section  1999; 02/07/2003  . Esophagogastroduodenoscopy  06/16/2011    Procedure: ESOPHAGOGASTRODUODENOSCOPY (EGD);  Surgeon: Willis ModenaWilliam Outlaw, MD;  [facility];  Service: Endoscopy;  Laterality: N/A;  . Balloon dilation  06/16/2011    Procedure: BALLOON DILATION;  Surgeon: Willis ModenaWilliam Outlaw, MD;  Location: [facility];  Service: Endoscopy;  Laterality: N/A;  . Balloon dilation  07/07/2011    Procedure: BALLOON DILATION;  Surgeon: Willis ModenaWilliam Outlaw, MD;  Location: [facility];  Service: Endoscopy;  Laterality: N/A;  . Esophagogastroduodenoscopy  07/28/2011    Procedure: ESOPHAGOGASTRODUODENOSCOPY (EGD);  Surgeon: Willis ModenaWilliam Outlaw, MD;  [facility];  Service: Endoscopy;  Laterality: N/A;  . Balloon dilation  07/28/2011    Procedure: BALLOON DILATION;  Surgeon: Willis ModenaWilliam Outlaw, MD;  Location: [facility];   Service: Endoscopy;  Laterality: N/A;  . Finger mass excision Left     index finger  . Mass excision Right 04/16/2013    Procedure: RIGHT INDEX EXCISION MASS;  Surgeon: Tami RibasKevin R Kuzma, MD;  Location: [facility];  Service: Orthopedics;  Laterality: Right;   Family History  Problem Relation Age of Onset  . Anesthesia problems Mother     post-op nausea   History  Substance Use Topics  . Smoking status: Never Smoker   . Smokeless tobacco: Never Used  . Alcohol Use: No   OB History    No data available     Review of Systems  Constitutional: Negative for fever.  HENT: Positive for congestion, postnasal drip, rhinorrhea and sore throat.   Respiratory: Positive for cough. Negative for shortness of breath and wheezing.   Cardiovascular: Negative for chest pain.  Gastrointestinal: Negative for abdominal pain.  Skin: Negative.     Allergies  Codeine and Sucralfate  Home Medications   Prior to Admission medications   Medication Sig Start Date End Date Taking? Authorizing Provider  albuterol (PROVENTIL HFA;VENTOLIN HFA) 108 (90 BASE) MCG/ACT inhaler Inhale into the lungs every 6 (six) hours as needed for wheezing or shortness of breath.   Yes Historical Provider, MD  chlorpheniramine-HYDROcodone (TUSSIONEX PENNKINETIC ER) 10-8 MG/5ML LQCR Take 5 mLs by mouth every 12 (twelve) hours as needed for cough. 04/22/14  Yes Graylon GoodZachary H Baker, PA-C  esomeprazole (NEXIUM) 40 MG capsule Take 40 mg by mouth 2 (two) times daily.   Yes  Historical Provider, MD  rizatriptan (MAXALT) 10 MG tablet Take 10 mg by mouth as needed for migraine. May repeat in 2 hours if needed   Yes Historical Provider, MD  HYDROcodone-acetaminophen [facility]) 5-325 MG per tablet 1-2 tabs po q6 hours prn pain 04/16/13   Betha Loa, MD  ipratropium (ATROVENT) 0.06 % nasal spray Place 2 sprays into both nostrils 4 (four) times daily. 04/26/14   Linna Hoff, MD  minocycline (MINOCIN,DYNACIN) 100 MG capsule Take 1 capsule  (100 mg total) by mouth 2 (two) times daily. 04/26/14   Linna Hoff, MD   BP 125/79 mmHg  Pulse 90  Temp(Src) 99.1 F (37.3 C) (Oral)  Resp 16  SpO2 99%  LMP 04/26/2014 (Approximate) Physical Exam  Constitutional: She is oriented to person, place, and time. She appears well-developed and well-nourished. No distress.  HENT:  Head: Normocephalic.  Right Ear: External ear normal.  Left Ear: External ear normal.  Nose: Nose normal.  Mouth/Throat: Oropharynx is clear and moist.  Eyes: Conjunctivae and EOM are normal. Pupils are equal, round, and reactive to light.  Neck: Normal range of motion. Neck supple.  Cardiovascular: Normal heart sounds and intact distal pulses.   Pulmonary/Chest: Effort normal and breath sounds normal.  Lymphadenopathy:    She has no cervical adenopathy.  Neurological: She is alert and oriented to person, place, and time.  Skin: Skin is warm and dry.  Nursing note and vitals reviewed.   ED Course  Procedures (including critical care time) Labs Review Labs Reviewed  POCT RAPID STREP A ([facility]RG CARE ONLY)    Imaging Review No results found.   MDM   1. URI (upper respiratory infection)        Linna Hoff, MD 04/26/14 [phone]

## 2014-04-28 LAB — CULTURE, GROUP A STREP

## 2014-06-10 ENCOUNTER — Encounter (HOSPITAL_COMMUNITY): Payer: Self-pay | Admitting: Emergency Medicine

## 2014-06-10 ENCOUNTER — Emergency Department (HOSPITAL_COMMUNITY)
Admission: EM | Admit: 2014-06-10 | Discharge: 2014-06-10 | Disposition: A | Discharge status: Home / Self Care | Payer: Medicaid Other | Source: Home / Self Care | Attending: Emergency Medicine | Admitting: Emergency Medicine

## 2014-06-10 DIAGNOSIS — T148XXA Other injury of unspecified body region, initial encounter: Secondary | ICD-10-CM

## 2014-06-10 DIAGNOSIS — S46811A Strain of other muscles, fascia and tendons at shoulder and upper arm level, right arm, initial encounter: Secondary | ICD-10-CM

## 2014-06-10 DIAGNOSIS — T148 Other injury of unspecified body region: Secondary | ICD-10-CM | POA: Diagnosis not present

## 2014-06-10 DIAGNOSIS — S29012A Strain of muscle and tendon of back wall of thorax, initial encounter: Secondary | ICD-10-CM

## 2014-06-10 MED ORDER — TRAMADOL HCL 50 MG PO TABS: 50.0000 mg | ORAL_TABLET | Freq: Four times a day (QID) | ORAL | Status: DC | PRN

## 2014-06-10 MED ORDER — DICLOFENAC SODIUM 1 % TD GEL: 1.0000 "application " | Freq: Four times a day (QID) | TRANSDERMAL | Status: DC

## 2014-06-10 MED ORDER — KETOROLAC TROMETHAMINE 60 MG/2ML IM SOLN
INTRAMUSCULAR | Status: AC
  Filled 2014-06-10: qty 2

## 2014-06-10 MED ORDER — KETOROLAC TROMETHAMINE 60 MG/2ML IM SOLN
60.0000 mg | Freq: Once | INTRAMUSCULAR | Status: AC
  Administered 2014-06-10: 60 mg via INTRAMUSCULAR

## 2014-06-10 NOTE — Discharge Instructions (Signed)
Muscle Strain A muscle strain is an injury that occurs when a muscle is stretched beyond its normal length. Usually a small number of muscle fibers are torn when this happens. Muscle strain is rated in degrees. First-degree strains have the least amount of muscle fiber tearing and pain. Second-degree and third-degree strains have increasingly more tearing and pain.  Usually, recovery from muscle strain takes 1-2 weeks. Complete healing takes 5-6 weeks.  CAUSES  Muscle strain happens when a sudden, violent force placed on a muscle stretches it too far. This may occur with lifting, sports, or a fall.  RISK FACTORS Muscle strain is especially common in athletes.  SIGNS AND SYMPTOMS At the site of the muscle strain, there may be:  Pain.  Bruising.  Swelling.  Difficulty using the muscle due to pain or lack of normal function. DIAGNOSIS  Your health care provider will perform a physical exam and ask about your medical history. TREATMENT  Often, the best treatment for a muscle strain is resting, icing, and applying cold compresses to the injured area.  HOME CARE INSTRUCTIONS   Use the PRICE method of treatment to promote muscle healing during the first 2-3 days after your injury. The PRICE method involves:  Protecting the muscle from being injured again.  Restricting your activity and resting the injured body part.  Icing your injury. To do this, put ice in a plastic bag. Place a towel between your skin and the bag. Then, apply the ice and leave it on from 15-20 minutes each hour. After the third day, switch to moist heat packs.  Apply compression to the injured area with a splint or elastic bandage. Be careful not to wrap it too tightly. This may interfere with blood circulation or increase swelling.  Elevate the injured body part above the level of your heart as often as you can.  Only take over-the-counter or prescription medicines for pain, discomfort, or fever as directed by your  health care provider.  Warming up prior to exercise helps to prevent future muscle strains. SEEK MEDICAL CARE IF:   You have increasing pain or swelling in the injured area.  You have numbness, tingling, or a significant loss of strength in the injured area. MAKE SURE YOU:   Understand these instructions.  Will watch your condition.  Will get help right away if you are not doing well or get worse. Document Released: 03/08/2005 Document Revised: 12/27/2012 Document Reviewed: 10/05/2012 [facility] Patient Information 2015 [place], [place]. This information is not intended to replace advice given to you by your health care provider. Make sure you discuss any questions you have with your health care provider.  Thoracic Strain You have injured the muscles or tendons that attach to the upper part of your back behind your chest. This injury is called a thoracic strain, thoracic sprain, or mid-back strain.  CAUSES  The cause of thoracic strain varies. A less severe injury involves pulling a muscle or tendon without tearing it. A more severe injury involves tearing (rupturing) a muscle or tendon. With less severe injuries, there may be little loss of strength. Sometimes, there are breaks (fractures) in the bones to which the muscles are attached. These fractures are rare, unless there was a direct hit (trauma) or you have weak bones due to osteoporosis or age. Longstanding strains may be caused by overuse or improper form during certain movements. Obesity can also increase your risk for back injuries. Sudden strains may occur due to injury or not warming up  properly before exercise. Often, there is no obvious cause for a thoracic strain. SYMPTOMS  The main symptom is pain, especially with movement, such as during exercise. DIAGNOSIS  Your caregiver can usually tell what is wrong by taking an X-ray and doing a physical exam. TREATMENT   Physical therapy may be helpful for recovery. Your caregiver can  give you exercises to do or refer you to a physical therapist after your pain improves.  After your pain improves, strengthening and conditioning programs appropriate for your sport or occupation may be helpful.  Always warm up before physical activities or athletics. Stretching after physical activity may also help.  Certain over-the-counter medicines may also help. Ask your caregiver if there are medicines that would help you. If this is your first thoracic strain injury, proper care and proper healing time before starting activities should prevent long-term problems. Torn ligaments and tendons require as long to heal as broken bones. Average healing times may be only 1 week for a mild strain. For torn muscles and tendons, healing time may be up to 6 weeks to 2 months. HOME CARE INSTRUCTIONS   Apply ice to the injured area. Ice massages may also be used as directed.  Put ice in a plastic bag.  Place a towel between your skin and the bag.  Leave the ice on for 15-20 minutes, 03-04 times a day, for the first 2 days.  Only take over-the-counter or prescription medicines for pain, discomfort, or fever as directed by your caregiver.  Keep your appointments for physical therapy if this was prescribed.  Use wraps and back braces as instructed. SEEK IMMEDIATE MEDICAL CARE IF:   You have an increase in bruising, swelling, or pain.  Your pain has not improved with medicines.  You develop new shortness of breath, chest pain, or fever.  Problems seem to be getting worse rather than better. MAKE SURE YOU:   Understand these instructions.  Will watch your condition.  Will get help right away if you are not doing well or get worse. Document Released: 05/29/2003 Document Revised: 05/31/2011 Document Reviewed: 04/24/2010 [facility] Patient Information 2015 [place], [place]. This information is not intended to replace advice given to you by your health care provider. Make sure you discuss any  questions you have with your health care provider.

## 2014-06-10 NOTE — ED Notes (Signed)
C/o upper back pain onset 1 week Denies inj/trauma, urinary sx Alert, no signs of acuate distress.

## 2014-06-10 NOTE — ED Provider Notes (Addendum)
CSN: [id number]     Arrival date & time 06/10/14  1207 History   First MD Initiated Contact with Patient 06/10/14 1447     Chief Complaint  Patient presents with  . Back Pain   (Consider location/radiation/quality/duration/timing/severity/associated sxs/prior Treatment) HPI Comments: 34 year old female states that 9 years ago she had an injury to her right upper back adjacent to her scapula which produced a lot of muscle pain. This eventually abated. She states that one week ago she developed pain to the right parathoracic musculature as well as right upper back muscles. It is worse with certain movements. 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 Denies trauma or any single causal event. Denies spinal pain, numbness, weakness.  Patient is a 34 y.o. female presenting with back pain.  Back Pain Associated symptoms: no fever     Past Medical History  Diagnosis Date  . GERD (gastroesophageal reflux disease)   . H/O hiatal hernia   . PONV (postoperative nausea and vomiting)     nausea only  . Migraine   . Asthma     prn inhaler  . History of esophageal stricture     multiple esophageal dilations - states x 12  . Finger mass, right 03/2013    index finger   Past Surgical History  Procedure Laterality Date  . Esophagogastroduodenoscopy  07/07/2011  . Cesarean section  1999; 02/07/2003  . Esophagogastroduodenoscopy  06/16/2011    Procedure: ESOPHAGOGASTRODUODENOSCOPY (EGD);  Surgeon: Willis Modena, MD;  Location: [facility];  Service: Endoscopy;  Laterality: N/A;  . Balloon dilation  06/16/2011    Procedure: BALLOON DILATION;  Surgeon: Willis Modena, MD;  Location: [facility];  Service: Endoscopy;  Laterality: N/A;  . Balloon dilation  07/07/2011    Procedure: BALLOON DILATION;  Surgeon: Willis Modena, MD;  Location: [facility];  Service: Endoscopy;  Laterality: N/A;  . Esophagogastroduodenoscopy  07/28/2011    Procedure: ESOPHAGOGASTRODUODENOSCOPY (EGD);   Surgeon: Willis Modena, MD;  Location: [facility];  Service: Endoscopy;  Laterality: N/A;  . Balloon dilation  07/28/2011    Procedure: BALLOON DILATION;  Surgeon: Willis Modena, MD;  Location: [facility];  Service: Endoscopy;  Laterality: N/A;  . Finger mass excision Left     index finger  . Mass excision Right 04/16/2013    Procedure: RIGHT INDEX EXCISION MASS;  Surgeon: Tami Ribas, MD;  Location: [facility];  Service: Orthopedics;  Laterality: Right;   Family History  Problem Relation Age of Onset  . Anesthesia problems Mother     post-op nausea   History  Substance Use Topics  . Smoking status: Never Smoker   . Smokeless tobacco: Never Used  . Alcohol Use: No   OB History    No data available     Review of Systems  Constitutional: Positive for activity change. Negative for fever and chills.  HENT: Negative.   Respiratory: Negative.   Cardiovascular: Negative.   Musculoskeletal: Positive for myalgias and back pain. Negative for gait problem.       As per HPI  Skin: Negative for color change, pallor and rash.  Neurological: Negative.     Allergies  Codeine and Sucralfate  Home Medications   Prior to Admission medications   Medication Sig Start Date End Date Taking? Authorizing Provider  albuterol (PROVENTIL HFA;VENTOLIN HFA) 108 (90 BASE) MCG/ACT inhaler Inhale into the lungs every 6 (six) hours as needed for wheezing or shortness of breath.    Historical Provider,  MD  chlorpheniramine-HYDROcodone (TUSSIONEX PENNKINETIC ER) 10-8 MG/5ML LQCR Take 5 mLs by mouth every 12 (twelve) hours as needed for cough. 04/22/14   Graylon GoodZachary H Baker, PA-C  diclofenac sodium (VOLTAREN) 1 % GEL Apply 1 application topically 4 (four) times daily. 06/10/14   Hayden Rasmussenavid Ebonee Stober, NP  esomeprazole (NEXIUM) 40 MG capsule Take 40 mg by mouth 2 (two) times daily.    Historical Provider, MD  HYDROcodone-acetaminophen [facility](NORCO) 5-325 MG per tablet 1-2 tabs po q6 hours prn pain 04/16/13   Betha LoaKevin  Kuzma, MD  ipratropium (ATROVENT) 0.06 % nasal spray Place 2 sprays into both nostrils 4 (four) times daily. 04/26/14   Linna HoffJames D Kindl, MD  minocycline (MINOCIN,DYNACIN) 100 MG capsule Take 1 capsule (100 mg total) by mouth 2 (two) times daily. 04/26/14   Linna HoffJames D Kindl, MD  rizatriptan (MAXALT) 10 MG tablet Take 10 mg by mouth as needed for migraine. May repeat in 2 hours if needed    Historical Provider, MD  traMADol (ULTRAM) 50 MG tablet Take 1 tablet (50 mg total) by mouth every 6 (six) hours as needed. 06/10/14   Hayden Rasmussenavid Riggs Dineen, NP   LMP 05/25/2014 Physical Exam  Constitutional: She is oriented to person, place, and time. She appears well-developed and well-nourished. No distress.  HENT:  Head: Normocephalic and atraumatic.  Eyes: EOM are normal.  Neck: Normal range of motion. Neck supple.  Patient with good range of motion in regards to rotation left and right. When patient rotates to the left and then hyperflexed as her neck this produces pain in the musculature that she had been complaining about. Area pain is from the rhomboid and a portion of the trapezius.  Cardiovascular: Normal rate.   Pulmonary/Chest: Effort normal and breath sounds normal.  Musculoskeletal: She exhibits tenderness. She exhibits no edema.  Tenderness over the right parathoracic musculature including the rhomboid muscle and the posterior trapezius muscle just medial to the scapular border. No swelling, discoloration or deformity. Full range of motion of her arms.  Lymphadenopathy:    She has no cervical adenopathy.  Neurological: She is alert and oriented to person, place, and time. No cranial nerve deficit.  Skin: Skin is warm and dry.  Psychiatric: She has a normal mood and affect.  Nursing note and vitals reviewed.   ED Course  Procedures (including critical care time) Labs Review Labs Reviewed - No data to display  Imaging Review No results found.   MDM   1. Muscle strain   2. Trapezius strain, right,  initial encounter   3. Rhomboid muscle strain, initial encounter    Heat, stretches as demo'd, diclofenac gel qid, tramadol prn Limit pulling and other movements that produce pain Toradol 60 mg IM    Hayden Rasmussenavid Quincie Haroon, NP 06/10/14 1506  Hayden Rasmussenavid Nomi Rudnicki, NP 06/12/14 1702

## 2014-06-20 ENCOUNTER — Encounter (HOSPITAL_COMMUNITY): Payer: Self-pay | Admitting: Emergency Medicine

## 2014-06-20 ENCOUNTER — Emergency Department (HOSPITAL_COMMUNITY)
Admission: EM | Admit: 2014-06-20 | Discharge: 2014-06-20 | Disposition: A | Discharge status: Home / Self Care | Payer: Medicaid Other | Source: Home / Self Care | Attending: Family Medicine | Admitting: Family Medicine

## 2014-06-20 DIAGNOSIS — J039 Acute tonsillitis, unspecified: Secondary | ICD-10-CM | POA: Diagnosis not present

## 2014-06-20 LAB — POCT RAPID STREP A: STREPTOCOCCUS, GROUP A SCREEN (DIRECT): NEGATIVE

## 2014-06-20 MED ORDER — AMOXICILLIN-POT CLAVULANATE 875-125 MG PO TABS: 1.0000 | ORAL_TABLET | Freq: Two times a day (BID) | ORAL | Status: DC

## 2014-06-20 NOTE — Discharge Instructions (Signed)
Thank you for coming in today. °Call or go to the emergency room if you get worse, have trouble breathing, have chest pains, or palpitations.  ° ° °Tonsillitis °Tonsillitis is an infection of the throat that causes the tonsils to become red, tender, and swollen. Tonsils are collections of lymphoid tissue at the back of the throat. Each tonsil has crevices (crypts). Tonsils help fight nose and throat infections and keep infection from spreading to other parts of the body for the first 18 months of life.  °CAUSES °Sudden (acute) tonsillitis is usually caused by infection with streptococcal bacteria. Long-lasting (chronic) tonsillitis occurs when the crypts of the tonsils become filled with pieces of food and bacteria, which makes it easy for the tonsils to become repeatedly infected. °SYMPTOMS  °Symptoms of tonsillitis include: °· A sore throat, with possible difficulty swallowing. °· White patches on the tonsils. °· Fever. °· Tiredness. °· New episodes of snoring during sleep, when you did not snore before. °· Small, foul-smelling, yellowish-white pieces of material (tonsilloliths) that you occasionally cough up or spit out. The tonsilloliths can also cause you to have bad breath. °DIAGNOSIS °Tonsillitis can be diagnosed through a physical exam. Diagnosis can be confirmed with the results of lab tests, including a throat culture. °TREATMENT  °The goals of tonsillitis treatment include the reduction of the severity and duration of symptoms and prevention of associated conditions. Symptoms of tonsillitis can be improved with the use of steroids to reduce the swelling. Tonsillitis caused by bacteria can be treated with antibiotic medicines. Usually, treatment with antibiotic medicines is started before the cause of the tonsillitis is known. However, if it is determined that the cause is not bacterial, antibiotic medicines will not treat the tonsillitis. If attacks of tonsillitis are severe and frequent, your health care  provider may recommend surgery to remove the tonsils (tonsillectomy). °HOME CARE INSTRUCTIONS  °· Rest as much as possible and get plenty of sleep. °· Drink plenty of fluids. While the throat is very sore, eat soft foods or liquids, such as sherbet, soups, or instant breakfast drinks. °· Eat frozen ice pops. °· Gargle with a warm or cold liquid to help soothe the throat. Mix 1/4 teaspoon of salt and 1/4 teaspoon of baking soda in 8 oz of water. °SEEK MEDICAL CARE IF:  °· Large, tender lumps develop in your neck. °· A rash develops. °· A green, yellow-brown, or bloody substance is coughed up. °· You are unable to swallow liquids or food for 24 hours. °· You notice that only one of the tonsils is swollen. °SEEK IMMEDIATE MEDICAL CARE IF:  °· You develop any new symptoms such as vomiting, severe headache, stiff neck, chest pain, or trouble breathing or swallowing. °· You have severe throat pain along with drooling or voice changes. °· You have severe pain, unrelieved with recommended medications. °· You are unable to fully open the mouth. °· You develop redness, swelling, or severe pain anywhere in the neck. °· You have a fever. °MAKE SURE YOU:  °· Understand these instructions. °· Will watch your condition. °· Will get help right away if you are not doing well or get worse. °Document Released: 12/16/2004 Document Revised: 07/23/2013 Document Reviewed: 08/25/2012 °[employer]® Patient Information ©2015 [employer]. This information is not intended to replace advice given to you by your health care provider. Make sure you discuss any questions you have with your health care provider. ° °

## 2014-06-20 NOTE — ED Notes (Signed)
C/o sore throat. On set today.  Denies fever, n/v/d.  No otc meds taken.

## 2014-06-20 NOTE — ED Provider Notes (Signed)
[name] is a 34 y.o. female who presents to Urgent Care today for sore throat. Patient has a one-day history of sore throat. 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 No fevers or chills vomiting or diarrhea. 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 She feels well otherwise.   Past Medical History  Diagnosis Date  . GERD (gastroesophageal reflux disease)   . H/O hiatal hernia   . PONV (postoperative nausea and vomiting)     nausea only  . Migraine   . Asthma     prn inhaler  . History of esophageal stricture     multiple esophageal dilations - states x 12  . Finger mass, right 03/2013    index finger   Past Surgical History  Procedure Laterality Date  . Esophagogastroduodenoscopy  07/07/2011  . Cesarean section  1999; 02/07/2003  . Esophagogastroduodenoscopy  06/16/2011    Procedure: ESOPHAGOGASTRODUODENOSCOPY (EGD);  Surgeon: Willis ModenaWilliam Outlaw, MD;  [facility];  Service: Endoscopy;  Laterality: N/A;  . Balloon dilation  06/16/2011    Procedure: BALLOON DILATION;  Surgeon: Willis ModenaWilliam Outlaw, MD;  Location: [facility];  Service: Endoscopy;  Laterality: N/A;  . Balloon dilation  07/07/2011    Procedure: BALLOON DILATION;  Surgeon: Willis ModenaWilliam Outlaw, MD;  Location: [facility];  Service: Endoscopy;  Laterality: N/A;  . Esophagogastroduodenoscopy  07/28/2011    Procedure: ESOPHAGOGASTRODUODENOSCOPY (EGD);  Surgeon: Willis ModenaWilliam Outlaw, MD;  [facility];  Service: Endoscopy;  Laterality: N/A;  . Balloon dilation  07/28/2011    Procedure: BALLOON DILATION;  Surgeon: Willis ModenaWilliam Outlaw, MD;  Location: [facility];  Service: Endoscopy;  Laterality: N/A;  . Finger mass excision Left     index finger  . Mass excision Right 04/16/2013    Procedure: RIGHT INDEX EXCISION MASS;  Surgeon: Tami RibasKevin R Kuzma, MD;  Location: [facility];  Service: Orthopedics;  Laterality: Right;    History  Substance Use Topics  . Smoking status: Never Smoker   . Smokeless tobacco: Never Used  . Alcohol Use: No   ROS as above Medications: No current facility-administered medications for this encounter.   Current Outpatient Prescriptions  Medication Sig Dispense Refill  . albuterol (PROVENTIL HFA;VENTOLIN HFA) 108 (90 BASE) MCG/ACT inhaler Inhale into the lungs every 6 (six) hours as needed for wheezing or shortness of breath.    Marland Kitchen. amoxicillin-clavulanate (AUGMENTIN) 875-125 MG per tablet Take 1 tablet by mouth every 12 (twelve) hours. 14 tablet 0  . chlorpheniramine-HYDROcodone (TUSSIONEX PENNKINETIC ER) 10-8 MG/5ML LQCR Take 5 mLs by mouth every 12 (twelve) hours as needed for cough. 115 mL 0  . diclofenac sodium (VOLTAREN) 1 % GEL Apply 1 application topically 4 (four) times daily. 100 g 0  . esomeprazole (NEXIUM) 40 MG capsule Take 40 mg by mouth 2 (two) times daily.    Marland Kitchen. HYDROcodone-acetaminophen (NORCO) 5-325 MG per tablet 1-2 tabs po q6 hours prn pain 30 tablet 0  . ipratropium (ATROVENT) 0.06 % nasal spray Place 2 sprays into both nostrils 4 (four) times daily. 15 mL 1  . minocycline (MINOCIN,DYNACIN) 100 MG capsule Take 1 capsule (100 mg total) by mouth 2 (two) times daily. 14 capsule 0  . rizatriptan (MAXALT) 10 MG tablet Take 10 mg by mouth as needed for migraine. May repeat in 2 hours if needed    . traMADol (ULTRAM) 50 MG tablet Take 1 tablet (50 mg total) by mouth every  6 (six) hours as needed. 15 tablet 0   Allergies  Allergen Reactions  . Codeine Nausea And Vomiting  . Sucralfate Nausea And Vomiting     Exam:  BP 120/80 mmHg  Pulse 72  Temp(Src) 98 F (36.7 C) (Oral)  Resp 18  SpO2 95%  LMP 05/25/2014 Gen: Well NAD HEENT: EOMI,  MMM right tonsil enlarged erythematous with exudate. Left is normal. Moderate cervical lymphadenopathy is present bilaterally. Lungs: Normal work of breathing. CTABL Heart: RRR no MRG Abd: NABS, Soft. Nondistended,  Nontender Exts: Brisk capillary refill, warm and well perfused.   Results for orders placed or performed during the hospital encounter of 06/20/14 (from the past 24 hour(s))  POCT rapid strep A [facility])     Status: None   Collection Time: 06/20/14  7:24 PM  Result Value Ref Range   Streptococcus, Group A Screen (Direct) NEGATIVE NEGATIVE   No results found.  Assessment and Plan: 34 y.o. female with tonsillitis. Treatment with Augmentin. Culture pending.  Discussed warning signs or symptoms. Please see discharge instructions. Patient expresses understanding.     Rodolph Bong, MD 06/20/14 [phone]

## 2014-06-22 ENCOUNTER — Emergency Department (HOSPITAL_COMMUNITY): Payer: Medicaid Other

## 2014-06-22 ENCOUNTER — Encounter (HOSPITAL_COMMUNITY): Payer: Self-pay | Admitting: Emergency Medicine

## 2014-06-22 ENCOUNTER — Emergency Department (HOSPITAL_COMMUNITY)
Admission: EM | Admit: 2014-06-22 | Discharge: 2014-06-22 | Disposition: A | Discharge status: Home / Self Care | Payer: Medicaid Other | Attending: Emergency Medicine | Admitting: Emergency Medicine

## 2014-06-22 DIAGNOSIS — J029 Acute pharyngitis, unspecified: Secondary | ICD-10-CM | POA: Diagnosis present

## 2014-06-22 DIAGNOSIS — K219 Gastro-esophageal reflux disease without esophagitis: Secondary | ICD-10-CM | POA: Insufficient documentation

## 2014-06-22 DIAGNOSIS — Z79899 Other long term (current) drug therapy: Secondary | ICD-10-CM | POA: Diagnosis not present

## 2014-06-22 DIAGNOSIS — J45909 Unspecified asthma, uncomplicated: Secondary | ICD-10-CM | POA: Insufficient documentation

## 2014-06-22 DIAGNOSIS — G43909 Migraine, unspecified, not intractable, without status migrainosus: Secondary | ICD-10-CM | POA: Insufficient documentation

## 2014-06-22 DIAGNOSIS — Z791 Long term (current) use of non-steroidal anti-inflammatories (NSAID): Secondary | ICD-10-CM | POA: Insufficient documentation

## 2014-06-22 DIAGNOSIS — J039 Acute tonsillitis, unspecified: Secondary | ICD-10-CM

## 2014-06-22 LAB — CBC WITH DIFFERENTIAL/PLATELET
BASOS ABS: 0 10*3/uL (ref 0.0–0.1)
BASOS PCT: 0 % (ref 0–1)
Eosinophils Absolute: 0 10*3/uL (ref 0.0–0.7)
Eosinophils Relative: 0 % (ref 0–5)
HCT: 44.5 % (ref 36.0–46.0)
Hemoglobin: 15.2 g/dL — ABNORMAL HIGH (ref 12.0–15.0)
LYMPHS ABS: 1.2 10*3/uL (ref 0.7–4.0)
LYMPHS PCT: 16 % (ref 12–46)
MCH: 30.4 pg (ref 26.0–34.0)
MCHC: 34.2 g/dL (ref 30.0–36.0)
MCV: 89 fL (ref 78.0–100.0)
MONO ABS: 0.9 10*3/uL (ref 0.1–1.0)
Monocytes Relative: 12 % (ref 3–12)
NEUTROS PCT: 72 % (ref 43–77)
Neutro Abs: 5.2 10*3/uL (ref 1.7–7.7)
PLATELETS: 246 10*3/uL (ref 150–400)
RBC: 5 MIL/uL (ref 3.87–5.11)
RDW: 13.7 % (ref 11.5–15.5)
WBC: 7.3 10*3/uL (ref 4.0–10.5)

## 2014-06-22 LAB — I-STAT CHEM 8, ED
BUN: 11 mg/dL (ref 6–23)
CHLORIDE: 102 mmol/L (ref 96–112)
CREATININE: 0.6 mg/dL (ref 0.50–1.10)
Calcium, Ion: 1.04 mmol/L — ABNORMAL LOW (ref 1.12–1.23)
GLUCOSE: 94 mg/dL (ref 70–99)
HEMATOCRIT: 49 % — AB (ref 36.0–46.0)
HEMOGLOBIN: 16.7 g/dL — AB (ref 12.0–15.0)
Potassium: 4.1 mmol/L (ref 3.5–5.1)
Sodium: 137 mmol/L (ref 135–145)
TCO2: 21 mmol/L (ref 0–100)

## 2014-06-22 MED ORDER — SODIUM CHLORIDE 0.9 % IV SOLN
1.5000 g | Freq: Once | INTRAVENOUS | Status: AC
  Administered 2014-06-22: 1.5 g via INTRAVENOUS
  Filled 2014-06-22: qty 1.5

## 2014-06-22 MED ORDER — SODIUM CHLORIDE 0.9 % IV BOLUS (SEPSIS)
500.0000 mL | Freq: Once | INTRAVENOUS | Status: AC
  Administered 2014-06-22: 500 mL via INTRAVENOUS

## 2014-06-22 MED ORDER — ONDANSETRON HCL 4 MG/2ML IJ SOLN
4.0000 mg | Freq: Once | INTRAMUSCULAR | Status: AC
  Administered 2014-06-22: 4 mg via INTRAVENOUS
  Filled 2014-06-22: qty 2

## 2014-06-22 MED ORDER — DEXAMETHASONE SODIUM PHOSPHATE 10 MG/ML IJ SOLN
10.0000 mg | Freq: Once | INTRAMUSCULAR | Status: AC
  Administered 2014-06-22: 10 mg via INTRAVENOUS
  Filled 2014-06-22: qty 1

## 2014-06-22 MED ORDER — HYDROCODONE-ACETAMINOPHEN 5-325 MG PO TABS
1.0000 | ORAL_TABLET | Freq: Four times a day (QID) | ORAL | Status: DC | PRN
Start: 2014-06-22 — End: 2015-06-12

## 2014-06-22 MED ORDER — IOHEXOL 300 MG/ML  SOLN
100.0000 mL | Freq: Once | INTRAMUSCULAR | Status: AC | PRN
  Administered 2014-06-22: 100 mL via INTRAVENOUS

## 2014-06-22 MED ORDER — MORPHINE SULFATE 4 MG/ML IJ SOLN
4.0000 mg | Freq: Once | INTRAMUSCULAR | Status: AC
  Administered 2014-06-22: 4 mg via INTRAVENOUS
  Filled 2014-06-22: qty 1

## 2014-06-22 NOTE — ED Provider Notes (Addendum)
CSN: [id number]     Arrival date & time 06/22/14  0016 History   First MD Initiated Contact with Patient 06/22/14 [phone]     Chief Complaint  Patient presents with  . Sore Throat     (Consider location/radiation/quality/duration/timing/severity/associated sxs/prior Treatment) HPI Comments: Sore throat for 2 days pain worse despite treatment   Patient is a 34 y.o. female presenting with pharyngitis. The history is provided by the patient.  Sore Throat Associated symptoms include a sore throat.    Past Medical History  Diagnosis Date  . GERD (gastroesophageal reflux disease)   . H/O hiatal hernia   . PONV (postoperative nausea and vomiting)     nausea only  . Migraine   . Asthma     prn inhaler  . History of esophageal stricture     multiple esophageal dilations - states x 12  . Finger mass, right 03/2013    index finger   Past Surgical History  Procedure Laterality Date  . Esophagogastroduodenoscopy  07/07/2011  . Cesarean section  1999; 02/07/2003  . Esophagogastroduodenoscopy  06/16/2011    Procedure: ESOPHAGOGASTRODUODENOSCOPY (EGD);  Surgeon: Willis ModenaWilliam Outlaw, MD;  [facility];  Service: Endoscopy;  Laterality: N/A;  . Balloon dilation  06/16/2011    Procedure: BALLOON DILATION;  Surgeon: Willis ModenaWilliam Outlaw, MD;  Location: [facility];  Service: Endoscopy;  Laterality: N/A;  . Balloon dilation  07/07/2011    Procedure: BALLOON DILATION;  Surgeon: Willis ModenaWilliam Outlaw, MD;  Location: [facility];  Service: Endoscopy;  Laterality: N/A;  . Esophagogastroduodenoscopy  07/28/2011    Procedure: ESOPHAGOGASTRODUODENOSCOPY (EGD);  Surgeon: Willis ModenaWilliam Outlaw, MD;  [facility];  Service: Endoscopy;  Laterality: N/A;  . Balloon dilation  07/28/2011    Procedure: BALLOON DILATION;  Surgeon: Willis ModenaWilliam Outlaw, MD;  Location: [facility];  Service: Endoscopy;  Laterality: N/A;  . Finger mass excision Left     index finger  . Mass excision Right 04/16/2013    Procedure: RIGHT INDEX  EXCISION MASS;  Surgeon: Tami RibasKevin R Kuzma, MD;  Location: [facility];  Service: Orthopedics;  Laterality: Right;  . Tubal ligation     Family History  Problem Relation Age of Onset  . Anesthesia problems Mother     post-op nausea   History  Substance Use Topics  . Smoking status: Never Smoker   . Smokeless tobacco: Never Used  . Alcohol Use: No   OB History    No data available     Review of Systems  HENT: Positive for sore throat.       Allergies  Codeine and Sucralfate  Home Medications   Prior to Admission medications   Medication Sig Start Date End Date Taking? Authorizing Provider  albuterol (PROVENTIL HFA;VENTOLIN HFA) 108 (90 BASE) MCG/ACT inhaler Inhale into the lungs every 6 (six) hours as needed for wheezing or shortness of breath.   Yes Historical Provider, MD  amoxicillin-clavulanate (AUGMENTIN) 875-125 MG per tablet Take 1 tablet by mouth every 12 (twelve) hours. 06/20/14  Yes Rodolph BongEvan S Corey, MD  diclofenac sodium (VOLTAREN) 1 % GEL Apply 1 application topically 4 (four) times daily. 06/10/14  Yes Hayden Rasmussenavid Mabe, NP  esomeprazole (NEXIUM) 40 MG capsule Take 40 mg by mouth 2 (two) times daily.   Yes Historical Provider, MD  ipratropium (ATROVENT) 0.06 % nasal spray Place 2 sprays into both nostrils 4 (four) times daily. 04/26/14  Yes Linna HoffJames D Kindl, MD  chlorpheniramine-HYDROcodone (TUSSIONEX PENNKINETIC ER) 10-8 MG/5ML LQCR Take 5 mLs by mouth every  12 (twelve) hours as needed for cough. Patient not taking: Reported on 06/22/2014 04/22/14   Graylon Good, PA-C  HYDROcodone-acetaminophen (NORCO/VICODIN) 5-325 MG per tablet Take 1 tablet by mouth every 6 (six) hours as needed for moderate pain. 06/22/14   Earley Favor, NP  minocycline (MINOCIN,DYNACIN) 100 MG capsule Take 1 capsule (100 mg total) by mouth 2 (two) times daily. Patient not taking: Reported on 06/22/2014 04/26/14   Linna Hoff, MD  rizatriptan (MAXALT) 10 MG tablet Take 10 mg by mouth as needed for  migraine. May repeat in 2 hours if needed    Historical Provider, MD  traMADol (ULTRAM) 50 MG tablet Take 1 tablet (50 mg total) by mouth every 6 (six) hours as needed. 06/10/14   Hayden Rasmussen, NP   BP 117/76 mmHg  Pulse 106  Temp(Src) 99 F (37.2 C) (Oral)  Resp 18  Ht  (1.626 m)  Wt 131 lb (59.421 kg)  BMI 22.47 kg/m2  SpO2 99%  LMP 06/21/2014 Physical Exam  Constitutional: She appears well-developed and well-nourished.  HENT:  Head: Normocephalic.  Right Ear: External ear normal.  Left Ear: External ear normal.  Mouth/Throat: Uvula is midline. No oral lesions. No trismus in the jaw. Oropharyngeal exudate, posterior oropharyngeal edema and posterior oropharyngeal erythema present.  Eyes: Pupils are equal, round, and reactive to light.  Musculoskeletal: Normal range of motion.  Lymphadenopathy:    She has cervical adenopathy.  Neurological: She is alert.  Skin: Skin is warm.  Nursing note and vitals reviewed.   ED Course  Procedures (including critical care time) Labs Review Labs Reviewed  CBC WITH DIFFERENTIAL/PLATELET - Abnormal; Notable for the following:    Hemoglobin 15.2 (*)    All other components within normal limits  I-STAT CHEM 8, ED - Abnormal; Notable for the following:    Calcium, Ion 1.04 (*)    Hemoglobin 16.7 (*)    HCT 49.0 (*)    All other components within normal limits    Imaging Review Ct Soft Tissue Neck W Contrast  06/22/2014   CLINICAL DATA:  Acute onset of sore throat.  Initial encounter.  EXAM: CT NECK WITH CONTRAST  TECHNIQUE: Multidetector CT imaging of the neck was performed using the standard protocol following the bolus administration of intravenous contrast.  CONTRAST:  OMNIPAQUE IOHEXOL 300 MG/ML  SOLN  COMPARISON:  None.  FINDINGS: Pharynx and larynx: The nasopharynx, oropharynx and hypopharynx are unremarkable in appearance. The valleculae and piriform sinuses are within normal limits. The proximal trachea is unremarkable in  appearance.  There is mildly asymmetric enhancement of the right palatine tonsil, with suggestion of mild associated edema. No focal abscess is seen. The adenoids are grossly unremarkable in appearance. There is no evidence of significant effacement of the hypopharynx. The parapharyngeal fat planes are preserved.  Salivary glands: The parotid and submandibular glands are grossly unremarkable in appearance, though there is some degree of fatty infiltration of the submandibular glands.  Thyroid: The thyroid gland is unremarkable in appearance.  Lymph nodes: Visualized cervical nodes remain borderline normal in size, without evidence of cervical lymphadenopathy.  Vascular: The visualized vasculature is unremarkable in appearance. There is no evidence of vascular compromise.  Limited intracranial: The visualized portions of the brain are unremarkable in appearance.  Visualized orbits: The visualized portions of the orbits are unremarkable in appearance.  Mastoids and visualized paranasal sinuses: There is partial opacification of the left ethmoid air cells. The visualized paranasal sinuses and mastoid air cells  are well-aerated.  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 Multiple maxillary and mandibular dental caries are seen.  Upper chest: The visualized portions of the upper mediastinum are unremarkable. The visualized lung apices are clear.  IMPRESSION: 1. Mild asymmetric enhancement of the right palatine tonsil, with suggestion of mild associated edema. No focal abscess seen. No evidence of effacement of the hypopharynx. The nasopharynx, oropharynx and hypopharynx are unremarkable in appearance. 2. Soft tissues otherwise unremarkable in appearance. 3. 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   Electronically Signed   By: Roanna Raider M.D.   On: 06/22/2014 03:05     EKG Interpretation None      Patient has been instructed to continue the antibiotic that she was prescribed yesterday.  Have added Vicodin for pain control.  She's been given a referral to ENT if necessary CT scan is reassuring that there is no peritonsillar abscess at this time MDM   Final diagnoses:  Exudative tonsillitis         Earley Favor, NP 06/22/14 0327  Earley Favor, NP 06/22/14 0327  Azalia Bilis, MD 06/22/14 0730  Earley Favor, NP 07/07/14 1610  Azalia Bilis, MD 07/09/14 [phone]

## 2014-06-22 NOTE — ED Notes (Signed)
Patient transported to CT 

## 2014-06-22 NOTE — ED Notes (Signed)
Pt reports onset sore throat yesterday morning. Pt seen yesterday and diagnosed with tonsillitis, but pt states pain has worsened.

## 2014-06-22 NOTE — Discharge Instructions (Signed)
Your CT scan does not show any abscess in your tonsil area, which is excellent news.  Please continue your antibiotic.  You've also been given a dose of Decadron in the emergency room and that will help decrease the swelling.  He began a prescription for Vicodin for pain control.  Please take this as needed.  Given a referral to Dr. Lazarus SalinesWolicki,  an ENT specialist.  Please follow-up with him if you continue to have persistent swelling and pain in your throat despite the use of antibiotics

## 2014-06-22 NOTE — ED Notes (Signed)
Pt reports sore throat which started yesterday.  Was seen for same yesterday with negative rapid strep.

## 2014-06-24 LAB — CULTURE, GROUP A STREP

## 2014-06-25 ENCOUNTER — Emergency Department (HOSPITAL_COMMUNITY)
Admission: EM | Admit: 2014-06-25 | Discharge: 2014-06-25 | Disposition: A | Discharge status: Home / Self Care | Payer: Medicaid Other | Attending: Emergency Medicine | Admitting: Emergency Medicine

## 2014-06-25 ENCOUNTER — Encounter (HOSPITAL_COMMUNITY): Payer: Self-pay

## 2014-06-25 DIAGNOSIS — G43909 Migraine, unspecified, not intractable, without status migrainosus: Secondary | ICD-10-CM | POA: Diagnosis not present

## 2014-06-25 DIAGNOSIS — Z8719 Personal history of other diseases of the digestive system: Secondary | ICD-10-CM | POA: Diagnosis not present

## 2014-06-25 DIAGNOSIS — J029 Acute pharyngitis, unspecified: Secondary | ICD-10-CM | POA: Diagnosis present

## 2014-06-25 DIAGNOSIS — R1110 Vomiting, unspecified: Secondary | ICD-10-CM | POA: Insufficient documentation

## 2014-06-25 DIAGNOSIS — Z79899 Other long term (current) drug therapy: Secondary | ICD-10-CM | POA: Diagnosis not present

## 2014-06-25 DIAGNOSIS — R1013 Epigastric pain: Secondary | ICD-10-CM | POA: Diagnosis not present

## 2014-06-25 DIAGNOSIS — Z791 Long term (current) use of non-steroidal anti-inflammatories (NSAID): Secondary | ICD-10-CM | POA: Insufficient documentation

## 2014-06-25 DIAGNOSIS — J028 Acute pharyngitis due to other specified organisms: Secondary | ICD-10-CM | POA: Diagnosis not present

## 2014-06-25 DIAGNOSIS — B9689 Other specified bacterial agents as the cause of diseases classified elsewhere: Secondary | ICD-10-CM | POA: Insufficient documentation

## 2014-06-25 DIAGNOSIS — J45909 Unspecified asthma, uncomplicated: Secondary | ICD-10-CM | POA: Insufficient documentation

## 2014-06-25 LAB — CBC WITH DIFFERENTIAL/PLATELET
BASOS ABS: 0 10*3/uL (ref 0.0–0.1)
Basophils Relative: 0 % (ref 0–1)
Eosinophils Absolute: 0 10*3/uL (ref 0.0–0.7)
Eosinophils Relative: 1 % (ref 0–5)
HEMATOCRIT: 41.3 % (ref 36.0–46.0)
Hemoglobin: 13.7 g/dL (ref 12.0–15.0)
LYMPHS PCT: 26 % (ref 12–46)
Lymphs Abs: 1.3 10*3/uL (ref 0.7–4.0)
MCH: 29.3 pg (ref 26.0–34.0)
MCHC: 33.2 g/dL (ref 30.0–36.0)
MCV: 88.4 fL (ref 78.0–100.0)
Monocytes Absolute: 0.5 10*3/uL (ref 0.1–1.0)
Monocytes Relative: 11 % (ref 3–12)
NEUTROS ABS: 3.1 10*3/uL (ref 1.7–7.7)
NEUTROS PCT: 62 % (ref 43–77)
PLATELETS: 245 10*3/uL (ref 150–400)
RBC: 4.67 MIL/uL (ref 3.87–5.11)
RDW: 13.5 % (ref 11.5–15.5)
WBC: 5 10*3/uL (ref 4.0–10.5)

## 2014-06-25 LAB — BASIC METABOLIC PANEL
Anion gap: 9 (ref 5–15)
BUN: 6 mg/dL (ref 6–23)
CHLORIDE: 103 mmol/L (ref 96–112)
CO2: 25 mmol/L (ref 19–32)
Calcium: 8.5 mg/dL (ref 8.4–10.5)
Creatinine, Ser: 0.61 mg/dL (ref 0.50–1.10)
GFR calc Af Amer: >90 mL/min (ref >90)
GLUCOSE: 90 mg/dL (ref 70–99)
POTASSIUM: 3.3 mmol/L — AB (ref 3.5–5.1)
Sodium: 137 mmol/L (ref 135–145)

## 2014-06-25 MED ORDER — SODIUM CHLORIDE 0.9 % IV BOLUS (SEPSIS)
1000.0000 mL | Freq: Once | INTRAVENOUS | Status: AC
  Administered 2014-06-25: 1000 mL via INTRAVENOUS

## 2014-06-25 NOTE — ED Provider Notes (Signed)
CSN: [id number]     Arrival date & time 06/25/14  0919 History   First MD Initiated Contact with Patient 06/25/14 [phone]     Chief Complaint  Patient presents with  . Sore Throat   [name] is a 34 y.o. female with a history of eosinophilic esophagitis, GERD, and esophageal strictures who presents to the ED complaining of worsening sore throat for the past 5 days. The patient has recently been seen at urgent care and in the ED for the same sore throat. She had a CT scan of her neck on 06/22/14 that showed no sign of abscess. She was started on augmentin by urgent care which she has been trying to take. Patient reports that today she is difficulty keeping down water and pills. She reports she is able to swallow the water and pills but it hurts as it goes down her esophagus. She complains of epigastric pain when swallowing. She reports vomiting up water and her Augmentin today. She also complains of 8 out of 10 throat pain that is worse on her right side. She reports she is unable to see ENT without a referral. The patient denies fevers, chills, nausea, hematemesis, diarrhea, constipation, changes to her voice, or rashes.   (Consider location/radiation/quality/duration/timing/severity/associated sxs/prior Treatment) HPI  Past Medical History  Diagnosis Date  . GERD (gastroesophageal reflux disease)   . H/O hiatal hernia   . PONV (postoperative nausea and vomiting)     nausea only  . Migraine   . Asthma     prn inhaler  . History of esophageal stricture     multiple esophageal dilations - states x 12  . Finger mass, right 03/2013    index finger   Past Surgical History  Procedure Laterality Date  . Esophagogastroduodenoscopy  07/07/2011  . Cesarean section  1999; 02/07/2003  . Esophagogastroduodenoscopy  06/16/2011    Procedure: ESOPHAGOGASTRODUODENOSCOPY (EGD);  Surgeon: Willis ModenaWilliam Outlaw, MD;  Location[facility];  Service: Endoscopy;  Laterality: N/A;  . Balloon dilation  06/16/2011   Procedure: BALLOON DILATION;  Surgeon: Willis ModenaWilliam Outlaw, MD;  Location: [facility];  Service: Endoscopy;  Laterality: N/A;  . Balloon dilation  07/07/2011    Procedure: BALLOON DILATION;  Surgeon: Willis ModenaWilliam Outlaw, MD;  Location: [facility];  Service: Endoscopy;  Laterality: N/A;  . Esophagogastroduodenoscopy  07/28/2011    Procedure: ESOPHAGOGASTRODUODENOSCOPY (EGD);  Surgeon: Willis ModenaWilliam Outlaw, MD;  [facility];  Service: Endoscopy;  Laterality: N/A;  . Balloon dilation  07/28/2011    Procedure: BALLOON DILATION;  Surgeon: Willis ModenaWilliam Outlaw, MD;  Location: [facility];  Service: Endoscopy;  Laterality: N/A;  . Finger mass excision Left     index finger  . Mass excision Right 04/16/2013    Procedure: RIGHT INDEX EXCISION MASS;  Surgeon: Tami RibasKevin R Kuzma, MD;  Location: [facility];  Service: Orthopedics;  Laterality: Right;  . Tubal ligation     Family History  Problem Relation Age of Onset  . Anesthesia problems Mother     post-op nausea   History  Substance Use Topics  . Smoking status: Never Smoker   . Smokeless tobacco: Never Used  . Alcohol Use: No   OB History    No data available     Review of Systems  Constitutional: Negative for fever and chills.  HENT: Positive for sore throat. Negative for congestion, dental problem, drooling, ear pain, facial swelling, rhinorrhea, trouble swallowing and voice change.   Eyes: Negative for pain and visual disturbance.  Respiratory:  Negative for cough, shortness of breath and wheezing.   Cardiovascular: Negative for chest pain and palpitations.  Gastrointestinal: Positive for vomiting and abdominal pain. Negative for nausea, diarrhea and blood in stool.  Genitourinary: Negative for dysuria.  Musculoskeletal: Negative for back pain and neck pain.  Skin: Negative for rash.  Neurological: Negative for dizziness, light-headedness and headaches.      Allergies  Codeine and Sucralfate  Home Medications   Prior to  Admission medications   Medication Sig Start Date End Date Taking? Authorizing Provider  albuterol (PROVENTIL HFA;VENTOLIN HFA) 108 (90 BASE) MCG/ACT inhaler Inhale into the lungs every 6 (six) hours as needed for wheezing or shortness of breath.   Yes Historical Provider, MD  amoxicillin-clavulanate (AUGMENTIN) 875-125 MG per tablet Take 1 tablet by mouth every 12 (twelve) hours. 06/20/14  Yes Rodolph Bong, MD  diclofenac sodium (VOLTAREN) 1 % GEL Apply 1 application topically 4 (four) times daily. 06/10/14  Yes Hayden Rasmussen, NP  rizatriptan (MAXALT) 10 MG tablet Take 10 mg by mouth as needed for migraine. May repeat in 2 hours if needed   Yes Historical Provider, MD  chlorpheniramine-HYDROcodone (TUSSIONEX PENNKINETIC ER) 10-8 MG/5ML LQCR Take 5 mLs by mouth every 12 (twelve) hours as needed for cough. Patient not taking: Reported on 06/22/2014 04/22/14   Graylon Good, PA-C  HYDROcodone-acetaminophen (NORCO/VICODIN) 5-325 MG per tablet Take 1 tablet by mouth every 6 (six) hours as needed for moderate pain. 06/22/14   Earley Favor, NP  ipratropium (ATROVENT) 0.06 % nasal spray Place 2 sprays into both nostrils 4 (four) times daily. 04/26/14   Linna Hoff, MD  minocycline (MINOCIN,DYNACIN) 100 MG capsule Take 1 capsule (100 mg total) by mouth 2 (two) times daily. Patient not taking: Reported on 06/22/2014 04/26/14   Linna Hoff, MD  traMADol (ULTRAM) 50 MG tablet Take 1 tablet (50 mg total) by mouth every 6 (six) hours as needed. 06/10/14   Hayden Rasmussen, NP   BP 119/77 mmHg  Pulse 70  Temp(Src) 98.2 F (36.8 C) (Oral)  Resp 18  SpO2 97%  LMP 06/21/2014 Physical Exam  Constitutional: She is oriented to person, place, and time. She appears well-developed and well-nourished. No distress.  Nontoxic appearing.  HENT:  Head: Normocephalic and atraumatic.  Right Ear: External ear normal.  Left Ear: External ear normal.  Nose: Nose normal.  Mouth/Throat: Oropharyngeal exudate present.  Mild bilateral  tonsillar hypertrophy with exudates. No peritonsillar abscess. Uvula is midline without edema. Soft palate rises symmetrically. No posterior oropharynx edema. Bilateral tympanic membranes are pearly-gray without erythema or loss of landmarks.  Eyes: Conjunctivae are normal. Pupils are equal, round, and reactive to light. Right eye exhibits no discharge. Left eye exhibits no discharge.  Neck: Normal range of motion. Neck supple. No JVD present. No tracheal deviation present.  Cardiovascular: Normal rate, regular rhythm, normal heart sounds and intact distal pulses.  Exam reveals no gallop and no friction rub.   No murmur heard. Bilateral radial pulses are intact.  Pulmonary/Chest: Effort normal and breath sounds normal. No respiratory distress. She has no wheezes. She has no rales.  Abdominal: Soft. Bowel sounds are normal. She exhibits no distension and no mass. There is tenderness. There is no rebound and no guarding.  Mild epigastric tenderness with palpation.  Lymphadenopathy:    She has no cervical adenopathy.  Neurological: She is alert and oriented to person, place, and time. Coordination normal.  Skin: Skin is warm and dry. No rash noted. She  is not diaphoretic. No erythema. No pallor.  Psychiatric: She has a normal mood and affect. Her behavior is normal.  Nursing note and vitals reviewed.   ED Course  Procedures (including critical care time) Labs Review Labs Reviewed  BASIC METABOLIC PANEL - Abnormal; Notable for the following:    Potassium 3.3 (*)    All other components within normal limits  CBC WITH DIFFERENTIAL/PLATELET    Imaging Review No results found.   EKG Interpretation None      Filed Vitals:   06/25/14 0956 06/25/14 1215 06/25/14 1308  BP: 114/69 99/73 119/77  Pulse: 70 70   Temp: 98.2 F (36.8 C)    TempSrc: Oral    Resp: 18 18   SpO2: 97% 97%      MDM   Meds given in ED:  Medications  sodium chloride 0.9 % bolus 1,000 mL (0 mLs Intravenous  Stopped 06/25/14 1307)    Discharge Medication List as of 06/25/2014  1:04 PM      Final diagnoses:  Acute pharyngitis due to other specified organisms   This is a 34 y.o. female with a history of eosinophilic esophagitis, GERD, and esophageal strictures who presents to the ED complaining of worsening sore throat for the past 5 days. The patient has recently been seen at urgent care and in the ED for the same sore throat. She had a CT scan of her neck on 06/22/14 that showed no sign of abscess. She states she is able to swallow water and pills but has pain when swallowing. She reports having epigastric pain after swallowing. She reports vomiting after taking the pills. I witnessed the patient swallow water in the ED without any vomiting afterwards. The patient had no vomiting in the ED. She denies nausea. On exam the patient is afebrile and nontoxic appearing. She does have some mild tonsillar hypertrophy with exudates. Her uvula is midline without edema. Her soft palate rises symmetrically. No trismus. Her pain seems to be due to her esophageal strictures, not from her sore throat.  I consulted with Dr. Jodi Mourning who suggest basic blood work, pain medication  and fluids to evaluate. The patient does not wish for any pain meds but agrees with the rest of the plan. Dr. Jodi Mourning followed up with this patient after blood work returned and discharged the patient. She tolerated PO prior to discharge. Plan is for outpatient follow up with GI.    This patient was discussed with and evaluated by Dr. Jodi Mourning who agrees with assessment and plan.    Everlene Farrier, PA-C 06/25/14 2027  Blane Ohara, MD 06/29/14 1610  Blane Ohara, MD 06/29/14 [phone]

## 2014-06-25 NOTE — ED Notes (Signed)
Pt states since Thursday she has been seen at urgent care and Emergency dept for sore throat.  Given antibiotics.  Pt states this morning, tried to drink water.  Unable to get water down.

## 2014-06-25 NOTE — Progress Notes (Addendum)
Listed pcp for this medicaid [place] access pt is  [facility] [address] [place] [phone]  Pt prefers Dr Asencion Partridgeamille Andy who has not been placed on her medicaid card Pt states she requested this change in January 2016 but DSS has not updated this yet Cm attempted to updated this in EPIC  Pt with 5 [facility] ED visits in last 6 months and no admissions

## 2014-06-25 NOTE — Discharge Instructions (Signed)
If you were given medicines take as directed.  If you are on coumadin or contraceptives realize their levels and effectiveness is altered by many different medicines.  If you have any reaction (rash, tongues swelling, other) to the medicines stop taking and see a physician.   Please follow up as directed and return to the ER or see a physician for new or worsening symptoms.  Thank you. Filed Vitals:   06/25/14 0956 06/25/14 1215  BP: 114/69 99/73  Pulse: 70 70  Temp: 98.2 F (36.8 C)   TempSrc: Oral   Resp: 18 18  SpO2: 97% 97%

## 2014-06-26 ENCOUNTER — Emergency Department (HOSPITAL_COMMUNITY): Payer: Medicaid Other | Admitting: Certified Registered"

## 2014-06-26 ENCOUNTER — Encounter (HOSPITAL_COMMUNITY)
Admission: EM | Disposition: A | Discharge status: Home / Self Care | Payer: Self-pay | Source: Home / Self Care | Attending: Emergency Medicine

## 2014-06-26 ENCOUNTER — Ambulatory Visit (HOSPITAL_COMMUNITY): Admission: RE | Admit: 2014-06-26 | Payer: Medicaid Other | Source: Ambulatory Visit | Admitting: Gastroenterology

## 2014-06-26 ENCOUNTER — Emergency Department (HOSPITAL_COMMUNITY)
Admission: EM | Admit: 2014-06-26 | Discharge: 2014-06-26 | Disposition: A | Discharge status: Home / Self Care | Payer: Medicaid Other | Attending: Emergency Medicine | Admitting: Emergency Medicine

## 2014-06-26 ENCOUNTER — Encounter (HOSPITAL_COMMUNITY): Payer: Self-pay | Admitting: Family Medicine

## 2014-06-26 ENCOUNTER — Telehealth (HOSPITAL_COMMUNITY): Payer: Self-pay | Admitting: *Deleted

## 2014-06-26 DIAGNOSIS — K210 Gastro-esophageal reflux disease with esophagitis: Secondary | ICD-10-CM | POA: Diagnosis not present

## 2014-06-26 DIAGNOSIS — R1013 Epigastric pain: Secondary | ICD-10-CM

## 2014-06-26 DIAGNOSIS — K449 Diaphragmatic hernia without obstruction or gangrene: Secondary | ICD-10-CM | POA: Diagnosis not present

## 2014-06-26 HISTORY — PX: ESOPHAGOGASTRODUODENOSCOPY (EGD) WITH PROPOFOL: SHX5813

## 2014-06-26 HISTORY — PX: FOREIGN BODY REMOVAL: SHX962

## 2014-06-26 LAB — COMPREHENSIVE METABOLIC PANEL
ALT: 14 U/L (ref 0–35)
AST: 14 U/L (ref 0–37)
Albumin: 3.6 g/dL (ref 3.5–5.2)
Alkaline Phosphatase: 66 U/L (ref 39–117)
Anion gap: 7 (ref 5–15)
CHLORIDE: 107 mmol/L (ref 96–112)
CO2: 25 mmol/L (ref 19–32)
CREATININE: 0.65 mg/dL (ref 0.50–1.10)
Calcium: 8.5 mg/dL (ref 8.4–10.5)
Glucose, Bld: 92 mg/dL (ref 70–99)
Potassium: 3.7 mmol/L (ref 3.5–5.1)
Sodium: 139 mmol/L (ref 135–145)
Total Bilirubin: 0.5 mg/dL (ref 0.3–1.2)
Total Protein: 6.4 g/dL (ref 6.0–8.3)

## 2014-06-26 LAB — CBC WITH DIFFERENTIAL/PLATELET
BASOS PCT: 1 % (ref 0–1)
Basophils Absolute: 0 10*3/uL (ref 0.0–0.1)
EOS ABS: 0.1 10*3/uL (ref 0.0–0.7)
EOS PCT: 1 % (ref 0–5)
HCT: 40.1 % (ref 36.0–46.0)
Hemoglobin: 13.5 g/dL (ref 12.0–15.0)
Lymphocytes Relative: 24 % (ref 12–46)
Lymphs Abs: 1.3 10*3/uL (ref 0.7–4.0)
MCH: 29.6 pg (ref 26.0–34.0)
MCHC: 33.7 g/dL (ref 30.0–36.0)
MCV: 87.9 fL (ref 78.0–100.0)
Monocytes Absolute: 0.5 10*3/uL (ref 0.1–1.0)
Monocytes Relative: 9 % (ref 3–12)
NEUTROS PCT: 65 % (ref 43–77)
Neutro Abs: 3.5 10*3/uL (ref 1.7–7.7)
PLATELETS: 227 10*3/uL (ref 150–400)
RBC: 4.56 MIL/uL (ref 3.87–5.11)
RDW: 13.6 % (ref 11.5–15.5)
WBC: 5.4 10*3/uL (ref 4.0–10.5)

## 2014-06-26 LAB — LIPASE, BLOOD: Lipase: 20 U/L (ref 11–59)

## 2014-06-26 SURGERY — ESOPHAGOGASTRODUODENOSCOPY (EGD) WITH PROPOFOL
Anesthesia: Monitor Anesthesia Care

## 2014-06-26 MED ORDER — PANTOPRAZOLE SODIUM 40 MG IV SOLR
40.0000 mg | INTRAVENOUS | Status: AC
  Administered 2014-06-26: 40 mg via INTRAVENOUS
  Filled 2014-06-26: qty 40

## 2014-06-26 MED ORDER — SUCCINYLCHOLINE CHLORIDE 20 MG/ML IJ SOLN
INTRAMUSCULAR | Status: DC | PRN
  Administered 2014-06-26: 100 mg via INTRAVENOUS

## 2014-06-26 MED ORDER — SODIUM CHLORIDE 0.9 % IV BOLUS (SEPSIS)
1000.0000 mL | Freq: Once | INTRAVENOUS | Status: AC
  Administered 2014-06-26: 1000 mL via INTRAVENOUS

## 2014-06-26 MED ORDER — FENTANYL CITRATE 0.05 MG/ML IJ SOLN
INTRAMUSCULAR | Status: DC | PRN
  Administered 2014-06-26: 50 ug via INTRAVENOUS

## 2014-06-26 MED ORDER — PROPOFOL 10 MG/ML IV BOLUS
INTRAVENOUS | Status: DC | PRN
  Administered 2014-06-26: 15 mg via INTRAVENOUS

## 2014-06-26 MED ORDER — OMEPRAZOLE MAGNESIUM 20 MG PO TBEC: 20.0000 mg | DELAYED_RELEASE_TABLET | Freq: Two times a day (BID) | ORAL | Status: DC

## 2014-06-26 MED ORDER — ONDANSETRON HCL 4 MG/2ML IJ SOLN
INTRAMUSCULAR | Status: DC | PRN
  Administered 2014-06-26: 4 mg via INTRAVENOUS

## 2014-06-26 MED ORDER — MIDAZOLAM HCL 5 MG/5ML IJ SOLN
INTRAMUSCULAR | Status: DC | PRN
  Administered 2014-06-26: 1 mg via INTRAVENOUS

## 2014-06-26 MED ORDER — SODIUM CHLORIDE 0.9 % IV SOLN
INTRAVENOUS | Status: DC | PRN
  Administered 2014-06-26: 16:00:00 via INTRAVENOUS

## 2014-06-26 MED ORDER — GLUCAGON HCL RDNA (DIAGNOSTIC) 1 MG IJ SOLR
1.0000 mg | Freq: Once | INTRAMUSCULAR | Status: AC
  Administered 2014-06-26: 1 mg via INTRAVENOUS
  Filled 2014-06-26: qty 1

## 2014-06-26 MED ORDER — LIDOCAINE HCL (CARDIAC) 20 MG/ML IV SOLN
INTRAVENOUS | Status: DC | PRN
  Administered 2014-06-26: 100 mg via INTRAVENOUS

## 2014-06-26 MED ORDER — DEXAMETHASONE SODIUM PHOSPHATE 4 MG/ML IJ SOLN
INTRAMUSCULAR | Status: DC | PRN
  Administered 2014-06-26: 4 mg via INTRAVENOUS

## 2014-06-26 NOTE — ED Notes (Signed)
Pt aware of plan to transport to endo, pt verbalizes having transportation home after procedure, family at bedside aware of plan of care, report given to Endo staff

## 2014-06-26 NOTE — Anesthesia Postprocedure Evaluation (Signed)
  Anesthesia Post-op Note  Patient: [name]  Procedure(s) Performed: Procedure(s): ESOPHAGOGASTRODUODENOSCOPY (EGD) WITH PROPOFOL (N/A) FOREIGN BODY REMOVAL (N/A)  Patient Location: Endoscopy Unit  Anesthesia Type:General  Level of Consciousness: awake, alert  and oriented  Airway and Oxygen Therapy: Patient Spontanous Breathing  Post-op Pain: none  Post-op Assessment: Post-op Vital signs reviewed, Patient's Cardiovascular Status Stable and Respiratory Function Stable  Post-op Vital Signs: Reviewed and stable  Last Vitals:  Filed Vitals:   06/26/14 1613  BP: 110/71  Pulse: 91  Temp: 36.9 C  Resp: 12    Complications: No apparent anesthesia complications

## 2014-06-26 NOTE — H&P (Signed)
History  The patient presents to the emergency room with complaints of epigastric pain and dysphagia. She was diagnosed with tonsillitis last Thursday and started on Augmentin. She has been complaining of epigastric pain and odynophagia. She feels like food is sticking in the distal esophagus. There was concern when she came to the emergency room that she might have an esophageal foreign body. The patient has a history of eosinophilic esophagitis. She has undergone numerous esophageal dilations in the past. She used to see Dr. Dulce Sellaroutlaw she does not see him any longer. Her current gastroenterologist is in [place] and his name is Dr. Rosalia HammersKatobis. She is currently not on any PPI therapy or therapy for eosinophilic esophagitis.  Physical she is in no distress  Nonicteric  Heart regular rhythm no murmurs  Lungs clear  Abdomen: Bowel sounds normal, soft, there is some tenderness in the epigastrium but no rebound or guarding  Impression: Epigastric pain with dysphagia. History of eosinophilic esophagitis. Rule out esophageal foreign body.  Plan: Proceed with EGD to further investigate. I told her it was not safe to dilate a stricture if she had 1 if she has not been on treatment for eosinophilic esophagitis which she has not been. She is agreeable to endoscopy to see if she has a foreign body or not. She will likely need to be on PPI therapy.

## 2014-06-26 NOTE — Transfer of Care (Signed)
Immediate Anesthesia Transfer of Care Note  Patient: [name]  Procedure(s) Performed: Procedure(s): ESOPHAGOGASTRODUODENOSCOPY (EGD) WITH PROPOFOL (N/A) FOREIGN BODY REMOVAL (N/A)  Patient Location: Endoscopy Unit  Anesthesia Type:General  Level of Consciousness: awake, alert  and oriented  Airway & Oxygen Therapy: Patient Spontanous Breathing  Post-op Assessment: Report given to RN  Post vital signs: Reviewed and stable  Last Vitals:  Filed Vitals:   06/26/14 1613  BP:   Pulse: 91  Temp:   Resp:     Complications: No apparent anesthesia complications

## 2014-06-26 NOTE — ED Provider Notes (Signed)
CSN: [id number]     Arrival date & time 06/26/14  1117 History   First MD Initiated Contact with Patient 06/26/14 1131     Chief Complaint  Patient presents with  . Emesis  . Foreign Body     (Consider location/radiation/quality/duration/timing/severity/associated sxs/prior Treatment) HPI Comments: Patient is a 34 year old female with a past medical history of GERD, hiatal hernia and esophageal stricture who presents with a sensation of food stuck in her esophagus. Symptoms started 6 days ago and have remained constant since the onset. Patient has been seen in the ED multiple times for the same reason. She has had unremarkable workups which include a CT of her neck and rapid strep screening. The pain is now located in her epigastric region and is aching and severe. No radiation. She reports associated nausea and vomiting. No aggravating/alleviating factors. No other associated symptoms.   Patient is a 34 y.o. female presenting with vomiting and foreign body.  Emesis Associated symptoms: abdominal pain   Foreign Body Associated symptoms: abdominal pain, trouble swallowing and vomiting     Past Medical History  Diagnosis Date  . GERD (gastroesophageal reflux disease)   . H/O hiatal hernia   . PONV (postoperative nausea and vomiting)     nausea only  . Migraine   . Asthma     prn inhaler  . History of esophageal stricture     multiple esophageal dilations - states x 12  . Finger mass, right 03/2013    index finger   Past Surgical History  Procedure Laterality Date  . Esophagogastroduodenoscopy  07/07/2011  . Cesarean section  1999; 02/07/2003  . Esophagogastroduodenoscopy  06/16/2011    Procedure: ESOPHAGOGASTRODUODENOSCOPY (EGD);  Surgeon: Willis ModenaWilliam Outlaw, MD;  Location[facility];  Service: Endoscopy;  Laterality: N/A;  . Balloon dilation  06/16/2011    Procedure: BALLOON DILATION;  Surgeon: Willis ModenaWilliam Outlaw, MD;  Location: [facility];  Service: Endoscopy;  Laterality: N/A;  .  Balloon dilation  07/07/2011    Procedure: BALLOON DILATION;  Surgeon: Willis ModenaWilliam Outlaw, MD;  Location: [facility];  Service: Endoscopy;  Laterality: N/A;  . Esophagogastroduodenoscopy  07/28/2011    Procedure: ESOPHAGOGASTRODUODENOSCOPY (EGD);  Surgeon: Willis ModenaWilliam Outlaw, MD;  [facility];  Service: Endoscopy;  Laterality: N/A;  . Balloon dilation  07/28/2011    Procedure: BALLOON DILATION;  Surgeon: Willis ModenaWilliam Outlaw, MD;  Location: [facility];  Service: Endoscopy;  Laterality: N/A;  . Finger mass excision Left     index finger  . Mass excision Right 04/16/2013    Procedure: RIGHT INDEX EXCISION MASS;  Surgeon: Tami RibasKevin R Kuzma, MD;  Location: [facility];  Service: Orthopedics;  Laterality: Right;  . Tubal ligation     Family History  Problem Relation Age of Onset  . Anesthesia problems Mother     post-op nausea   History  Substance Use Topics  . Smoking status: Never Smoker   . Smokeless tobacco: Never Used  . Alcohol Use: No   OB History    No data available     Review of Systems  HENT: Positive for trouble swallowing.   Gastrointestinal: Positive for vomiting and abdominal pain.  All other systems reviewed and are negative.     Allergies  Codeine and Sucralfate  Home Medications   Prior to Admission medications   Medication Sig Start Date End Date Taking? Authorizing Provider  albuterol (PROVENTIL HFA;VENTOLIN HFA) 108 (90 BASE) MCG/ACT inhaler Inhale into the lungs every 6 (six) hours as needed  for wheezing or shortness of breath.   Yes Historical Provider, MD  amoxicillin-clavulanate (AUGMENTIN) 875-125 MG per tablet Take 1 tablet by mouth every 12 (twelve) hours. 06/20/14  Yes Rodolph Bong, MD  diclofenac sodium (VOLTAREN) 1 % GEL Apply 1 application topically 4 (four) times daily. 06/10/14  Yes Hayden Rasmussen, NP  HYDROcodone-acetaminophen (NORCO/VICODIN) 5-325 MG per tablet Take 1 tablet by mouth every 6 (six) hours as needed for moderate pain. 06/22/14   Yes Earley Favor, NP  rizatriptan (MAXALT) 10 MG tablet Take 10 mg by mouth as needed for migraine. May repeat in 2 hours if needed   Yes Historical Provider, MD  traMADol (ULTRAM) 50 MG tablet Take 1 tablet (50 mg total) by mouth every 6 (six) hours as needed. 06/10/14  Yes Hayden Rasmussen, NP  chlorpheniramine-HYDROcodone (TUSSIONEX PENNKINETIC ER) 10-8 MG/5ML LQCR Take 5 mLs by mouth every 12 (twelve) hours as needed for cough. Patient not taking: Reported on 06/22/2014 04/22/14   Graylon Good, PA-C  ipratropium (ATROVENT) 0.06 % nasal spray Place 2 sprays into both nostrils 4 (four) times daily. Patient not taking: Reported on 06/26/2014 04/26/14   Linna Hoff, MD  minocycline (MINOCIN,DYNACIN) 100 MG capsule Take 1 capsule (100 mg total) by mouth 2 (two) times daily. Patient not taking: Reported on 06/22/2014 04/26/14   Linna Hoff, MD   BP 129/83 mmHg  Pulse 99  Temp(Src) 98.8 F (37.1 C) (Oral)  Resp 18  SpO2 100%  LMP 06/21/2014 Physical Exam  Constitutional: She is oriented to person, place, and time. She appears well-developed and well-nourished. No distress.  HENT:  Head: Normocephalic and atraumatic.  Eyes: Conjunctivae are normal.  Neck: Normal range of motion.  Cardiovascular: Normal rate and regular rhythm.  Exam reveals no gallop and no friction rub.   No murmur heard. Pulmonary/Chest: Effort normal and breath sounds normal. She has no wheezes. She has no rales. She exhibits no tenderness.  Abdominal: Soft. She exhibits no distension. There is tenderness. There is no rebound.  Epigastric tenderness to palpation. No other focal tenderness.   Musculoskeletal: Normal range of motion.  Neurological: She is alert and oriented to person, place, and time. Coordination normal.  Speech is goal-oriented. Moves limbs without ataxia.   Skin: Skin is warm and dry.  Psychiatric: She has a normal mood and affect. Her behavior is normal.  Nursing note and vitals reviewed.   ED Course   Procedures (including critical care time) Labs Review Labs Reviewed  COMPREHENSIVE METABOLIC PANEL - Abnormal; Notable for the following:    BUN <5 (*)    All other components within normal limits  CBC WITH DIFFERENTIAL/PLATELET  LIPASE, BLOOD    Imaging Review No results found.   EKG Interpretation None      MDM   Final diagnoses:  Epigastric pain    1:08 PM Patient's labs pending. Patient reports feeling as though something is stuck at the bottom of her esophagus. Patient will have  glucagon. Patient is tolerating secretions.   Patient is unable to tolerate water PO due to pain in the epigastrium. Labs unremarkable for acute changes.   I spoke with Dr. Mikey College who will take the patient to endoscopy.    Emilia Beck, PA-C 06/26/14 1535  Donnetta Hutching, MD 06/27/14 1005

## 2014-06-26 NOTE — Anesthesia Procedure Notes (Signed)
Procedure Name: Intubation Date/Time: 06/26/2014 3:55 PM Performed by: De NurseENNIE, Keeghan Mcintire E Pre-anesthesia Checklist: Patient identified, Emergency Drugs available, Suction available, Patient being monitored and Timeout performed Patient Re-evaluated:Patient Re-evaluated prior to inductionOxygen Delivery Method: Circle system utilized Preoxygenation: Pre-oxygenation with 100% oxygen Intubation Type: IV induction, Rapid sequence and Cricoid Pressure applied Laryngoscope Size: Mac and 3 Grade View: Grade I Tube type: Oral Tube size: 7.0 mm Number of attempts: 1 Airway Equipment and Method: Stylet Placement Confirmation: ETT inserted through vocal cords under direct vision,  positive ETCO2 and breath sounds checked- equal and bilateral Secured at: 21 cm Tube secured with: Tape Dental Injury: Teeth and Oropharynx as per pre-operative assessment

## 2014-06-26 NOTE — Anesthesia Preprocedure Evaluation (Addendum)
Anesthesia Evaluation  Patient identified by MRN, date of birth, ID band Patient awake    Reviewed: Allergy & Precautions, NPO status , Patient's Chart, lab work & pertinent test results, reviewed documented beta blocker date and time   Airway Mallampati: II   Neck ROM: Full    Dental  (+) Dental Advisory Given, Teeth Intact, Missing,    Pulmonary asthma ,  breath sounds clear to auscultation        Cardiovascular Rhythm:Regular     Neuro/Psych    GI/Hepatic Neg liver ROS, GERD-  Controlled,  Endo/Other  negative endocrine ROS  Renal/GU negative Renal ROS     Musculoskeletal   Abdominal (+)  Abdomen: soft.    Peds  Hematology negative hematology ROS (+)   Anesthesia Other Findings Resent tonsilitis  Reproductive/Obstetrics                            Anesthesia Physical Anesthesia Plan  ASA: II and emergent  Anesthesia Plan:    Post-op Pain Management:    Induction: Intravenous  Airway Management Planned: Oral ETT  Additional Equipment:   Intra-op Plan:   Post-operative Plan: Extubation in OR  Informed Consent: I have reviewed the patients History and Physical, chart, labs and discussed the procedure including the risks, benefits and alternatives for the proposed anesthesia with the patient or authorized representative who has indicated his/her understanding and acceptance.     Plan Discussed with:   Anesthesia Plan Comments:         Anesthesia Quick Evaluation

## 2014-06-26 NOTE — Discharge Instructions (Signed)
Gastrointestinal Endoscopy, Care After °Refer to this sheet in the next few weeks. These instructions provide you with information on caring for yourself after your procedure. Your caregiver may also give you more specific instructions. Your treatment has been planned according to current medical practices, but problems sometimes occur. Call your caregiver if you have any problems or questions after your procedure. °HOME CARE INSTRUCTIONS °· If you were given medicine to help you relax (sedative), do not drive, operate machinery, or sign important documents for 24 hours. °· Avoid alcohol and hot or warm beverages for the first 24 hours after the procedure. °· Only take over-the-counter or prescription medicines for pain, discomfort, or fever as directed by your caregiver. You may resume taking your normal medicines unless your caregiver tells you otherwise. Ask your caregiver when you may resume taking medicines that may cause bleeding, such as aspirin, clopidogrel, or warfarin. °· You may return to your normal diet and activities on the day after your procedure, or as directed by your caregiver. Walking may help to reduce any bloated feeling in your abdomen. °· Drink enough fluids to keep your urine clear or pale yellow. °· You may gargle with salt water if you have a sore throat. °SEEK IMMEDIATE MEDICAL CARE IF: °· You have severe nausea or vomiting. °· You have severe abdominal pain, abdominal cramps that last longer than 6 hours, or abdominal swelling (distention). °· You have severe shoulder or back pain. °· You have trouble swallowing. °· You have shortness of breath, your breathing is shallow, or you are breathing faster than normal. °· You have a fever or a rapid heartbeat. °· You vomit blood or material that looks like coffee grounds. °· You have bloody, black, or tarry stools. °MAKE SURE YOU: °· Understand these instructions. °· Will watch your condition. °· Will get help right away if you are not doing  well or get worse. °Document Released: 10/21/2003 Document Revised: 07/23/2013 Document Reviewed: 06/08/2011 °ExitCare® Patient Information ©2015 [employer]. This information is not intended to replace advice given to you by your health care provider. Make sure you discuss any questions you have with your health care provider. ° °

## 2014-06-26 NOTE — ED Notes (Signed)
Pt here for vomiting and the sensation of food stuck in her esophagus. sts started on Augmentin Thursday night.

## 2014-06-26 NOTE — Op Note (Signed)
[facility] [address] [place], [id number]   ENDOSCOPY PROCEDURE REPORT  PATIENT: Allison PerchLung, Marasia R  MR#: [id number] BIRTHDATE: 1981/02/04 , 33  yrs. old GENDER: female ENDOSCOPIST: Wandalee FerdinandSam Kanden Carey, MD REFERRED BY: PROCEDURE DATE:  06/26/2014 PROCEDURE:  egd ASA CLASS:     2 INDICATIONS:  epigastric pain. Dysphagia. Rule out foreign body in the esophagus MEDICATIONS: TOPICAL ANESTHETIC:  DESCRIPTION OF PROCEDURE: After the risks benefits and alternatives of the procedure were thoroughly explained, informed consent was obtained.  The [employer] GASTOR[employer]COPE [id number] endoscope was introduced through the mouth and advanced to the second portion of the duodenum , Without limitations.  The instrument was slowly withdrawn as the mucosa was fully examined.  Findings esophagus: The esophagogastric junction was at 34 cm from the incisor teeth. There was some distal erosive esophagitis. No obvious significant stricture. No foreign body.  Stomach: Small hiatal hernia. Mucosa overall looks unremarkable.  Duodenum: Normal    The scope was then withdrawn from the patient and the procedure completed.  COMPLICATIONS: There were no immediate complications.  ENDOSCOPIC IMPRESSION:reflux esophagitis. No foreign body.   RECOMMENDATIONS:the patient should be on PPI therapy. I will recommend that she take Prilosec OTC twice a day. She can [place].   REPEAT EXAM:  eSignedWandalee Ferdinand:  Sam Anacarolina Evelyn, MD 06/26/2014 4:09 PM    CC:  CPT CODES: ICD CODES:  The ICD and CPT codes recommended by this software are interpretations from the data that the clinical staff has captured with the software.  The verification of the translation of this report to the ICD and CPT codes and modifiers is the sole responsibility of the health care institution and practicing physician where this report was generated.  [employer] will not  be held responsible for the validity of the ICD and CPT codes included on this report.  AMA assumes no liability for data contained or not contained herein. CPT is a Publishing rights managerregistered trademark of the [employer].  PATIENT NAME:  Allison PerchLung, Winter R MR#: [id number]

## 2014-06-26 NOTE — ED Notes (Signed)
S 

## 2014-06-26 NOTE — ED Notes (Addendum)
Throat culture: Strep beta hemolytic not group A.  Pt. adequately treated with Augmentin. I called pt. and left a message to call. Call 1. Vassie MoselleYork, Dhalia Zingaro M 06/26/2014  Left message. Call 2. 07/02/2014 Left message. Call 3. 07/03/2014 Unable to reach pt. By phone x 3.  Letter sent with result and instructed to call if any problems and notify contacts. 07/05/2014

## 2014-06-27 ENCOUNTER — Encounter (HOSPITAL_COMMUNITY): Payer: Self-pay | Admitting: Gastroenterology

## 2014-07-10 ENCOUNTER — Emergency Department (HOSPITAL_COMMUNITY)
Admission: EM | Admit: 2014-07-10 | Discharge: 2014-07-10 | Disposition: A | Discharge status: Home / Self Care | Payer: Medicaid Other | Source: Home / Self Care | Attending: Family Medicine | Admitting: Family Medicine

## 2014-07-10 ENCOUNTER — Encounter (HOSPITAL_COMMUNITY): Payer: Self-pay

## 2014-07-10 DIAGNOSIS — R21 Rash and other nonspecific skin eruption: Secondary | ICD-10-CM | POA: Diagnosis not present

## 2014-07-10 MED ORDER — HYDROXYZINE HCL 25 MG PO TABS: 25.0000 mg | ORAL_TABLET | Freq: Three times a day (TID) | ORAL | Status: DC | PRN

## 2014-07-10 MED ORDER — HYDROCORTISONE 1 % EX CREA: TOPICAL_CREAM | CUTANEOUS | Status: DC

## 2014-07-10 NOTE — ED Provider Notes (Signed)
CSN: [id number]     Arrival date & time 07/10/14  1802 History   First MD Initiated Contact with Patient 07/10/14 1934     Chief Complaint  Patient presents with  . Insect Bite   (Consider location/radiation/quality/duration/timing/severity/associated sxs/prior Treatment) HPI Comments: Patient states her husband works as a Administrator and two days ago she noticed 4 red pruritic lesions on her left lower leg. Has neither taken medication by mouth nor tried topical medication at home to treat these lesions. She has not sought evaluation by her PCP for this issue. Reports herself to be otherwise healthy. Works in Furniture conservator/restorer. States she has felt otherwise completely well. No recent tick removal. Husband does not have similar rash  The history is provided by the patient.    Past Medical History  Diagnosis Date  . GERD (gastroesophageal reflux disease)   . H/O hiatal hernia   . PONV (postoperative nausea and vomiting)     nausea only  . Migraine   . Asthma     prn inhaler  . History of esophageal stricture     multiple esophageal dilations - states x 12  . Finger mass, right 03/2013    index finger   Past Surgical History  Procedure Laterality Date  . Esophagogastroduodenoscopy  07/07/2011  . Cesarean section  1999; 02/07/2003  . Esophagogastroduodenoscopy  06/16/2011    Procedure: ESOPHAGOGASTRODUODENOSCOPY (EGD);  Surgeon: Willis Modena, MD;  Location: [facility];  Service: Endoscopy;  Laterality: N/A;  . Balloon dilation  06/16/2011    Procedure: BALLOON DILATION;  Surgeon: Willis Modena, MD;  Location: [facility];  Service: Endoscopy;  Laterality: N/A;  . Balloon dilation  07/07/2011    Procedure: BALLOON DILATION;  Surgeon: Willis Modena, MD;  Location: [facility];  Service: Endoscopy;  Laterality: N/A;  . Esophagogastroduodenoscopy  07/28/2011    Procedure: ESOPHAGOGASTRODUODENOSCOPY (EGD);  Surgeon: Willis Modena, MD;  Location: [facility];  Service: Endoscopy;  Laterality:  N/A;  . Balloon dilation  07/28/2011    Procedure: BALLOON DILATION;  Surgeon: Willis Modena, MD;  Location: [facility];  Service: Endoscopy;  Laterality: N/A;  . Finger mass excision Left     index finger  . Mass excision Right 04/16/2013    Procedure: RIGHT INDEX EXCISION MASS;  Surgeon: Tami Ribas, MD;  Location: [facility];  Service: Orthopedics;  Laterality: Right;  . Tubal ligation    . Esophagogastroduodenoscopy (egd) with propofol N/A 06/26/2014    Procedure: ESOPHAGOGASTRODUODENOSCOPY (EGD) WITH PROPOFOL;  Surgeon: Wandalee Ferdinand, MD;  Location: [facility];  Service: Endoscopy;  Laterality: N/A;  . Foreign body removal N/A 06/26/2014    Procedure: FOREIGN BODY REMOVAL;  Surgeon: Wandalee Ferdinand, MD;  Location: [facility];  Service: Endoscopy;  Laterality: N/A;   Family History  Problem Relation Age of Onset  . Anesthesia problems Mother     post-op nausea   History  Substance Use Topics  . Smoking status: Never Smoker   . Smokeless tobacco: Never Used  . Alcohol Use: No   OB History    No data available     Review of Systems  All other systems reviewed and are negative.   Allergies  Codeine; Sucralfate; and Augmentin  Home Medications   Prior to Admission medications   Medication Sig Start Date End Date Taking? Authorizing Provider  albuterol (PROVENTIL HFA;VENTOLIN HFA) 108 (90 BASE) MCG/ACT inhaler Inhale into the lungs every 6 (six) hours as needed for wheezing or shortness of breath.    Historical  Provider, MD  amoxicillin-clavulanate (AUGMENTIN) 875-125 MG per tablet Take 1 tablet by mouth every 12 (twelve) hours. 06/20/14   Rodolph BongEvan S Corey, MD  diclofenac sodium (VOLTAREN) 1 % GEL Apply 1 application topically 4 (four) times daily. 06/10/14   Hayden Rasmussenavid Mabe, NP  HYDROcodone-acetaminophen (NORCO/VICODIN) 5-325 MG per tablet Take 1 tablet by mouth every 6 (six) hours as needed for moderate pain. 06/22/14   Earley FavorGail Schulz, NP  hydrocortisone cream 1 % Apply to affected  area 2 times daily 07/10/14   Ria ClockJennifer Lee H Vung Kush, PA  hydrOXYzine (ATARAX/VISTARIL) 25 MG tablet Take 1 tablet (25 mg total) by mouth 3 (three) times daily as needed for itching. 07/10/14   Mathis FareJennifer Lee H Chanee Henrickson, PA  omeprazole (PRILOSEC OTC) 20 MG tablet Take 1 tablet (20 mg total) by mouth 2 (two) times daily. 06/26/14   Wandalee FerdinandSam Ganem, MD  rizatriptan (MAXALT) 10 MG tablet Take 10 mg by mouth as needed for migraine. May repeat in 2 hours if needed    Historical Provider, MD  traMADol (ULTRAM) 50 MG tablet Take 1 tablet (50 mg total) by mouth every 6 (six) hours as needed. 06/10/14   Hayden Rasmussenavid Mabe, NP   BP 114/70 mmHg  Pulse 71  Temp(Src) 98.3 F (36.8 C) (Oral)  Resp 18  SpO2 100%  LMP 06/21/2014 Physical Exam  Constitutional: She is oriented to person, place, and time. She appears well-developed and well-nourished. No distress.  HENT:  Head: Normocephalic and atraumatic.  Mouth/Throat: Oropharynx is clear and moist.  Cardiovascular: Normal rate.   Pulmonary/Chest: Effort normal.  Musculoskeletal: Normal range of motion.  Neurological: She is alert and oriented to person, place, and time.  Skin: Skin is warm, dry and intact.     Psychiatric: She has a normal mood and affect. Her behavior is normal.  Nursing note and vitals reviewed.   ED Course  Procedures (including critical care time) Labs Review Labs Reviewed - No data to display  Imaging Review No results found.   MDM   1. Rash    Atarax Topical hydrocortisone PCP follow up if no improvement   Ria ClockJennifer Lee H Carman Auxier, [place] 07/10/14 2012

## 2014-07-10 NOTE — ED Notes (Signed)
Concerned about insect bites on left lower leg x 2 days

## 2014-07-10 NOTE — ED Notes (Signed)
Patient came in to be seen for bites, and discussed her positive strep finding. patient states she went to the ED for a problem related to the augmentin, and was switched to another Rx  For strep , which she states she completed

## 2014-07-10 NOTE — Discharge Instructions (Signed)

## 2015-01-15 ENCOUNTER — Encounter (HOSPITAL_COMMUNITY): Payer: Self-pay | Admitting: Emergency Medicine

## 2015-01-15 ENCOUNTER — Emergency Department (INDEPENDENT_AMBULATORY_CARE_PROVIDER_SITE_OTHER)
Admission: EM | Admit: 2015-01-15 | Discharge: 2015-01-15 | Disposition: A | Discharge status: Home / Self Care | Payer: Self-pay | Source: Home / Self Care | Attending: Emergency Medicine | Admitting: Emergency Medicine

## 2015-01-15 DIAGNOSIS — G43009 Migraine without aura, not intractable, without status migrainosus: Secondary | ICD-10-CM

## 2015-01-15 MED ORDER — DEXAMETHASONE SODIUM PHOSPHATE 10 MG/ML IJ SOLN
INTRAMUSCULAR | Status: AC
  Filled 2015-01-15: qty 1

## 2015-01-15 MED ORDER — DIPHENHYDRAMINE HCL 50 MG/ML IJ SOLN
INTRAMUSCULAR | Status: AC
  Filled 2015-01-15: qty 1

## 2015-01-15 MED ORDER — KETOROLAC TROMETHAMINE 60 MG/2ML IM SOLN
60.0000 mg | Freq: Once | INTRAMUSCULAR | Status: AC
  Administered 2015-01-15: 60 mg via INTRAMUSCULAR

## 2015-01-15 MED ORDER — DIPHENHYDRAMINE HCL 50 MG/ML IJ SOLN
25.0000 mg | Freq: Once | INTRAMUSCULAR | Status: AC
  Administered 2015-01-15: 25 mg via INTRAMUSCULAR

## 2015-01-15 MED ORDER — DEXAMETHASONE SODIUM PHOSPHATE 10 MG/ML IJ SOLN
10.0000 mg | Freq: Once | INTRAMUSCULAR | Status: AC
  Administered 2015-01-15: 10 mg via INTRAMUSCULAR

## 2015-01-15 MED ORDER — ONDANSETRON 4 MG PO TBDP
ORAL_TABLET | ORAL | Status: AC
  Filled 2015-01-15: qty 1

## 2015-01-15 MED ORDER — KETOROLAC TROMETHAMINE 60 MG/2ML IM SOLN
INTRAMUSCULAR | Status: AC
  Filled 2015-01-15: qty 2

## 2015-01-15 MED ORDER — ONDANSETRON 4 MG PO TBDP
4.0000 mg | ORAL_TABLET | Freq: Once | ORAL | Status: AC
  Administered 2015-01-15: 4 mg via ORAL

## 2015-01-15 NOTE — Discharge Instructions (Signed)
We treated your migraine headache in the office today. This should break the cycle of headaches. If you continue to have frequent headaches, you will need to follow-up with a primary care doctor or a neurologist to discuss prophylactic medication. If you're headache changes or worsens, please go to the emergency room.

## 2015-01-15 NOTE — ED Provider Notes (Signed)
CSN: [id number]     Arrival date & time 01/15/15  1302 History   First MD Initiated Contact with Patient 01/15/15 1324     Chief Complaint  Patient presents with  . Headache   (Consider location/radiation/quality/duration/timing/severity/associated sxs/prior Treatment) HPI  She is a 34 year old woman here for evaluation of headache. She reports a daily headache for the last 3 days. It is right-sided and throbbing. It is associated with nausea and sensitivity to light. She has a history of migraine headaches and states this is her typical migraine. She states the headache will improve after taking a lot of Tylenol. She does have a prescription for Maxalt, but states she has been unable to take it as it makes her too tired to work. No focal neurologic findings.  She does report a migraine headache last week that was associated with blurred vision in her left eye. She states this is typical of her migraines.  Past Medical History  Diagnosis Date  . GERD (gastroesophageal reflux disease)   . H/O hiatal hernia   . PONV (postoperative nausea and vomiting)     nausea only  . Migraine   . Asthma     prn inhaler  . History of esophageal stricture     multiple esophageal dilations - states x 12  . Finger mass, right 03/2013    index finger   Past Surgical History  Procedure Laterality Date  . Esophagogastroduodenoscopy  07/07/2011  . Cesarean section  1999; 02/07/2003  . Esophagogastroduodenoscopy  06/16/2011    Procedure: ESOPHAGOGASTRODUODENOSCOPY (EGD);  Surgeon: Willis Modena, MD;  Location: [facility];  Service: Endoscopy;  Laterality: N/A;  . Balloon dilation  06/16/2011    Procedure: BALLOON DILATION;  Surgeon: Willis Modena, MD;  Location: [facility];  Service: Endoscopy;  Laterality: N/A;  . Balloon dilation  07/07/2011    Procedure: BALLOON DILATION;  Surgeon: Willis Modena, MD;  Location: [facility];  Service: Endoscopy;  Laterality: N/A;  . Esophagogastroduodenoscopy   07/28/2011    Procedure: ESOPHAGOGASTRODUODENOSCOPY (EGD);  Surgeon: Willis Modena, MD;  Location: [facility];  Service: Endoscopy;  Laterality: N/A;  . Balloon dilation  07/28/2011    Procedure: BALLOON DILATION;  Surgeon: Willis Modena, MD;  Location: [facility];  Service: Endoscopy;  Laterality: N/A;  . Finger mass excision Left     index finger  . Mass excision Right 04/16/2013    Procedure: RIGHT INDEX EXCISION MASS;  Surgeon: Tami Ribas, MD;  Location: [facility];  Service: Orthopedics;  Laterality: Right;  . Tubal ligation    . Esophagogastroduodenoscopy (egd) with propofol N/A 06/26/2014    Procedure: ESOPHAGOGASTRODUODENOSCOPY (EGD) WITH PROPOFOL;  Surgeon: Wandalee Ferdinand, MD;  Location: [facility] ENDOSCOPY;  Service: Endoscopy;  Laterality: N/A;  . Foreign body removal N/A 06/26/2014    Procedure: FOREIGN BODY REMOVAL;  Surgeon: Wandalee Ferdinand, MD;  Location[facility] END[facility]COPY;  Service: Endoscopy;  Laterality: N/A;   Family History  Problem Relation Age of Onset  . Anesthesia problems Mother     post-op nausea   Social History  Substance Use Topics  . Smoking status: Never Smoker   . Smokeless tobacco: Never Used  . Alcohol Use: No   OB History    No data available     Review of Systems As in history of present illness Allergies  Codeine; Sucralfate; and Augmentin  Home Medications   Prior to Admission medications   Medication Sig Start Date End Date Taking? Authorizing Provider  albuterol (PROVENTIL HFA;VENTOLIN HFA)  108 (90 BASE) MCG/ACT inhaler Inhale into the lungs every 6 (six) hours as needed for wheezing or shortness of breath.    Historical Provider, MD  diclofenac sodium (VOLTAREN) 1 % GEL Apply 1 application topically 4 (four) times daily. 06/10/14   Hayden Rasmussenavid Mabe, NP  HYDROcodone-acetaminophen (NORCO/VICODIN) 5-325 MG per tablet Take 1 tablet by mouth every 6 (six) hours as needed for moderate pain. 06/22/14   Earley FavorGail Schulz, NP  hydrocortisone cream 1 % Apply to  affected area 2 times daily 07/10/14   Ria ClockJennifer Lee H Presson, PA  hydrOXYzine (ATARAX/VISTARIL) 25 MG tablet Take 1 tablet (25 mg total) by mouth 3 (three) times daily as needed for itching. 07/10/14   Mathis FareJennifer Lee H Presson, PA  omeprazole (PRILOSEC OTC) 20 MG tablet Take 1 tablet (20 mg total) by mouth 2 (two) times daily. 06/26/14   Graylin ShiverSalem F Ganem, MD  rizatriptan (MAXALT) 10 MG tablet Take 10 mg by mouth as needed for migraine. May repeat in 2 hours if needed    Historical Provider, MD  traMADol (ULTRAM) 50 MG tablet Take 1 tablet (50 mg total) by mouth every 6 (six) hours as needed. 06/10/14   Hayden Rasmussenavid Mabe, NP   Meds Ordered and Administered this Visit   Medications  ketorolac (TORADOL) injection 60 mg (not administered)  dexamethasone (DECADRON) injection 10 mg (not administered)  diphenhydrAMINE (BENADRYL) injection 25 mg (not administered)  ondansetron (ZOFRAN-ODT) disintegrating tablet 4 mg (not administered)    BP 116/77 mmHg  Pulse 75  Temp(Src) 98.3 F (36.8 C) (Oral)  Resp 16  SpO2 99%  LMP 12/16/2014 No data found.   Physical Exam  Constitutional: She is oriented to person, place, and time. She appears well-developed and well-nourished. No distress.  Sitting in darkened room  Eyes: Conjunctivae and EOM are normal. Pupils are equal, round, and reactive to light.  Cardiovascular: Normal rate, regular rhythm and normal heart sounds.   No murmur heard. Pulmonary/Chest: Effort normal and breath sounds normal. No respiratory distress. She has no wheezes. She has no rales.  Neurological: She is alert and oriented to person, place, and time. No cranial nerve deficit. She exhibits normal muscle tone. Coordination normal.    ED Course  Procedures (including critical care time)  Labs Review Labs Reviewed - No data to display  Imaging Review No results found.    MDM   1. Migraine without aura and without status migrainosus, not intractable    Patient reports this is her  typical migraine headache. Neurologic exam is normal today. Will treat with migraine cocktail. Her friend will drive her home. Recommended follow-up with PCP or neurology if she continues to have frequent migraine headaches to discuss prophylaxis.    Charm RingsErin J Danyah Guastella, MD 01/15/15 [id number]

## 2015-01-15 NOTE — ED Notes (Signed)
Headache started yesterday.

## 2015-01-15 NOTE — ED Notes (Signed)
Driver with patient.

## 2015-01-30 ENCOUNTER — Emergency Department (HOSPITAL_COMMUNITY)
Admission: EM | Admit: 2015-01-30 | Discharge: 2015-01-30 | Disposition: A | Discharge status: Home / Self Care | Payer: Medicaid Other | Attending: Emergency Medicine | Admitting: Emergency Medicine

## 2015-01-30 ENCOUNTER — Encounter (HOSPITAL_COMMUNITY): Payer: Self-pay | Admitting: Emergency Medicine

## 2015-01-30 DIAGNOSIS — G43909 Migraine, unspecified, not intractable, without status migrainosus: Secondary | ICD-10-CM | POA: Insufficient documentation

## 2015-01-30 DIAGNOSIS — K219 Gastro-esophageal reflux disease without esophagitis: Secondary | ICD-10-CM | POA: Insufficient documentation

## 2015-01-30 DIAGNOSIS — Z7952 Long term (current) use of systemic steroids: Secondary | ICD-10-CM | POA: Insufficient documentation

## 2015-01-30 DIAGNOSIS — Z79899 Other long term (current) drug therapy: Secondary | ICD-10-CM | POA: Insufficient documentation

## 2015-01-30 DIAGNOSIS — Z791 Long term (current) use of non-steroidal anti-inflammatories (NSAID): Secondary | ICD-10-CM | POA: Insufficient documentation

## 2015-01-30 DIAGNOSIS — J069 Acute upper respiratory infection, unspecified: Secondary | ICD-10-CM | POA: Insufficient documentation

## 2015-01-30 DIAGNOSIS — J45909 Unspecified asthma, uncomplicated: Secondary | ICD-10-CM | POA: Insufficient documentation

## 2015-01-30 LAB — RAPID STREP SCREEN (MED CTR MEBANE ONLY): STREPTOCOCCUS, GROUP A SCREEN (DIRECT): NEGATIVE

## 2015-01-30 NOTE — ED Provider Notes (Signed)
CSN: [id number]     Arrival date & time 01/30/15  1031 History   First MD Initiated Contact with Patient 01/30/15 1156     Chief Complaint  Patient presents with  . congestion/sore throat      (Consider location/radiation/quality/duration/timing/severity/associated sxs/prior Treatment) HPI   Blood pressure 114/72, pulse 72, temperature 97.8 F (36.6 C), temperature source Oral, resp. rate 15, last menstrual period 11/01/2014, SpO2 100 %.  [name] is a 34 y.o. female complaining of runny nose, sore throat and fever of 101 last night resolved with acetaminophen. No antipyretics taken today. Patient denies cough, chest pain, shortness of breath, rash, cervicalgia, abdominal pain, nausea, vomiting   Past Medical History  Diagnosis Date  . GERD (gastroesophageal reflux disease)   . H/O hiatal hernia   . PONV (postoperative nausea and vomiting)     nausea only  . Migraine   . Asthma     prn inhaler  . History of esophageal stricture     multiple esophageal dilations - states x 12  . Finger mass, right 03/2013    index finger   Past Surgical History  Procedure Laterality Date  . Esophagogastroduodenoscopy  07/07/2011  . Cesarean section  1999; 02/07/2003  . Esophagogastroduodenoscopy  06/16/2011    Procedure: ESOPHAGOGASTRODUODENOSCOPY (EGD);  Surgeon: Willis ModenaWilliam Outlaw, MD;  [facility];  Service: Endoscopy;  Laterality: N/A;  . Balloon dilation  06/16/2011    Procedure: BALLOON DILATION;  Surgeon: Willis ModenaWilliam Outlaw, MD;  Location: [facility];  Service: Endoscopy;  Laterality: N/A;  . Balloon dilation  07/07/2011    Procedure: BALLOON DILATION;  Surgeon: Willis ModenaWilliam Outlaw, MD;  Location: [facility];  Service: Endoscopy;  Laterality: N/A;  . Esophagogastroduodenoscopy  07/28/2011    Procedure: ESOPHAGOGASTRODUODENOSCOPY (EGD);  Surgeon: Willis ModenaWilliam Outlaw, MD;  [facility];  Service: Endoscopy;  Laterality: N/A;  . Balloon dilation  07/28/2011    Procedure: BALLOON  DILATION;  Surgeon: Willis ModenaWilliam Outlaw, MD;  Location: [facility];  Service: Endoscopy;  Laterality: N/A;  . Finger mass excision Left     index finger  . Mass excision Right 04/16/2013    Procedure: RIGHT INDEX EXCISION MASS;  Surgeon: Tami RibasKevin R Kuzma, MD;  Location: [facility];  Service: Orthopedics;  Laterality: Right;  . Tubal ligation    . Esophagogastroduodenoscopy (egd) with propofol N/A 06/26/2014    Procedure: ESOPHAGOGASTRODUODENOSCOPY (EGD) WITH PROPOFOL;  Surgeon: Wandalee FerdinandSam Ganem, MD;  [facility];  Service: Endoscopy;  Laterality: N/A;  . Foreign body removal N/A 06/26/2014    Procedure: FOREIGN BODY REMOVAL;  Surgeon: Wandalee FerdinandSam Ganem, MD;  [facility];  Service: Endoscopy;  Laterality: N/A;   Family History  Problem Relation Age of Onset  . Anesthesia problems Mother     post-op nausea   Social History  Substance Use Topics  . Smoking status: Never Smoker   . Smokeless tobacco: Never Used  . Alcohol Use: No   OB History    No data available     Review of Systems  10 systems reviewed and found to be negative, except as noted in the HPI.  Allergies  Codeine; Sucralfate; and Augmentin  Home Medications   Prior to Admission medications   Medication Sig Start Date End Date Taking? Authorizing Provider  albuterol (PROVENTIL HFA;VENTOLIN HFA) 108 (90 BASE) MCG/ACT inhaler Inhale into the lungs every 6 (six) hours as needed for wheezing or shortness of breath.    Historical Provider, MD  diclofenac sodium (VOLTAREN) 1 % GEL  Apply 1 application topically 4 (four) times daily. 06/10/14   Hayden Rasmussen, NP  HYDROcodone-acetaminophen (NORCO/VICODIN) 5-325 MG per tablet Take 1 tablet by mouth every 6 (six) hours as needed for moderate pain. 06/22/14   Earley Favor, NP  hydrocortisone cream 1 % Apply to affected area 2 times daily 07/10/14   Ria Clock, PA  hydrOXYzine (ATARAX/VISTARIL) 25 MG tablet Take 1 tablet (25 mg total) by mouth 3 (three) times  daily as needed for itching. 07/10/14   Mathis Fare Presson, PA  omeprazole (PRILOSEC OTC) 20 MG tablet Take 1 tablet (20 mg total) by mouth 2 (two) times daily. 06/26/14   Graylin Shiver, MD  rizatriptan (MAXALT) 10 MG tablet Take 10 mg by mouth as needed for migraine. May repeat in 2 hours if needed    Historical Provider, MD  traMADol (ULTRAM) 50 MG tablet Take 1 tablet (50 mg total) by mouth every 6 (six) hours as needed. 06/10/14   Hayden Rasmussen, NP   BP 114/72 mmHg  Pulse 72  Temp(Src) 97.8 F (36.6 C) (Oral)  Resp 15  SpO2 100%  LMP 11/01/2014 Physical Exam  Constitutional: She is oriented to person, place, and time. She appears well-developed and well-nourished. No distress.  HENT:  Head: Normocephalic and atraumatic.  Mouth/Throat: Oropharynx is clear and moist.  No drooling or stridor. Posterior pharynx mildly erythematous no significant tonsillar hypertrophy. No exudate. Soft palate rises symmetrically. No TTP or induration under tongue.   No tenderness to palpation of frontal or bilateral maxillary sinuses.  No mucosal edema in the nares.  Bilateral tympanic membranes with normal architecture and good light reflex.    Eyes: Conjunctivae and EOM are normal. Pupils are equal, round, and reactive to light.  Neck: Normal range of motion.  Cardiovascular: Normal rate, regular rhythm and intact distal pulses.   Pulmonary/Chest: Effort normal and breath sounds normal.  Abdominal: Soft. There is no tenderness.  Musculoskeletal: Normal range of motion.  Neurological: She is alert and oriented to person, place, and time.  Skin: She is not diaphoretic.  Psychiatric: She has a normal mood and affect.  Nursing note and vitals reviewed.   ED Course  Procedures (including critical care time) Labs Review Labs Reviewed  RAPID STREP SCREEN (NOT AT [facility])  CULTURE, GROUP A STREP    Imaging Review No results found. I have personally reviewed and evaluated these images and lab  results as part of my medical decision-making.   EKG Interpretation None      MDM   Final diagnoses:  Viral URI    Filed Vitals:   01/30/15 1056  BP: 114/72  Pulse: 72  Temp: 97.8 F (36.6 C)  TempSrc: Oral  Resp: 15  SpO2: 100%    [name] is 34 y.o. female presenting with rhinorrhea and sore throat and fever. Patient afebrile, no tachycardia, tachypnea. Patient is saturating well on room air. Serrano sounds are clear to auscultation. I doubt a pneumonia. Physical exam is not consistent with a streptococcal pharyngitis. This is likely a viral upper respiratory infection. Encouraged patient to rest, counseled them on hand hygiene to prevent spread of infection.  Evaluation does not show pathology that would require ongoing emergent intervention or inpatient treatment. Pt is hemodynamically stable and mentating appropriately. Discussed findings and plan with patient/guardian, who agrees with care plan. All questions answered. Return precautions discussed and outpatient follow up given.       Wynetta Emery, PA-C 01/30/15 1223  Onalee Hua  Jackie Plum, MD 01/30/15 [phone]

## 2015-01-30 NOTE — ED Notes (Signed)
Per pt, states cold symptoms for a few dats, sore throat this am

## 2015-01-30 NOTE — Discharge Instructions (Signed)
Please follow with your primary care doctor in the next 2 days for a check-up. They must obtain records for further management.  ° °Do not hesitate to return to the Emergency Department for any new, worsening or concerning symptoms.  ° ° °Upper Respiratory Infection, Adult °Most upper respiratory infections (URIs) are a viral infection of the air passages leading to the lungs. A URI affects the nose, throat, and upper air passages. The most common type of URI is nasopharyngitis and is typically referred to as "the common cold." °URIs run their course and usually go away on their own. Most of the time, a URI does not require medical attention, but sometimes a bacterial infection in the upper airways can follow a viral infection. This is called a secondary infection. Sinus and middle ear infections are common types of secondary upper respiratory infections. °Bacterial pneumonia can also complicate a URI. A URI can worsen asthma and chronic obstructive pulmonary disease (COPD). Sometimes, these complications can require emergency medical care and may be life threatening.  °CAUSES °Almost all URIs are caused by viruses. A virus is a type of germ and can spread from one person to another.  °RISKS FACTORS °You may be at risk for a URI if:  °· You smoke.   °· You have chronic heart or lung disease. °· You have a weakened defense (immune) system.   °· You are very young or very old.   °· You have nasal allergies or asthma. °· You work in crowded or poorly ventilated areas. °· You work in health care facilities or schools. °SIGNS AND SYMPTOMS  °Symptoms typically develop 2-3 days after you come in contact with a cold virus. Most viral URIs last 7-10 days. However, viral URIs from the influenza virus (flu virus) can last 14-18 days and are typically more severe. Symptoms may include:  °· Runny or stuffy (congested) nose.   °· Sneezing.   °· Cough.   °· Sore throat.   °· Headache.   °· Fatigue.   °· Fever.   °· Loss of  appetite.   °· Pain in your forehead, behind your eyes, and over your cheekbones (sinus pain). °· Muscle aches.   °DIAGNOSIS  °Your health care provider may diagnose a URI by: °· Physical exam. °· Tests to check that your symptoms are not due to another condition such as: °¨ Strep throat. °¨ Sinusitis. °¨ Pneumonia. °¨ Asthma. °TREATMENT  °A URI goes away on its own with time. It cannot be cured with medicines, but medicines may be prescribed or recommended to relieve symptoms. Medicines may help: °· Reduce your fever. °· Reduce your cough. °· Relieve nasal congestion. °HOME CARE INSTRUCTIONS  °· Take medicines only as directed by your health care provider.   °· Gargle warm saltwater or take cough drops to comfort your throat as directed by your health care provider. °· Use a warm mist humidifier or inhale steam from a shower to increase air moisture. This may make it easier to breathe. °· Drink enough fluid to keep your urine clear or pale yellow.   °· Eat soups and other clear broths and maintain good nutrition.   °· Rest as needed.   °· Return to work when your temperature has returned to normal or as your health care provider advises. You may need to stay home longer to avoid infecting others. You can also use a face mask and careful hand washing to prevent spread of the virus. °· Increase the usage of your inhaler if you have asthma.   °· Do not use any tobacco products, including cigarettes,   chewing tobacco, or electronic cigarettes. If you need help quitting, ask your health care provider. °PREVENTION  °The best way to protect yourself from getting a cold is to practice good hygiene.  °· Avoid oral or hand contact with people with cold symptoms.   °· Wash your hands often if contact occurs.   °There is no clear evidence that vitamin C, vitamin E, echinacea, or exercise reduces the chance of developing a cold. However, it is always recommended to get plenty of rest, exercise, and practice good nutrition.    °SEEK MEDICAL CARE IF:  °· You are getting worse rather than better.   °· Your symptoms are not controlled by medicine.   °· You have chills. °· You have worsening shortness of breath. °· You have brown or red mucus. °· You have yellow or brown nasal discharge. °· You have pain in your face, especially when you bend forward. °· You have a fever. °· You have swollen neck glands. °· You have pain while swallowing. °· You have white areas in the back of your throat. °SEEK IMMEDIATE MEDICAL CARE IF:  °· You have severe or persistent: °¨ Headache. °¨ Ear pain. °¨ Sinus pain. °¨ Chest pain. °· You have chronic lung disease and any of the following: °¨ Wheezing. °¨ Prolonged cough. °¨ Coughing up blood. °¨ A change in your usual mucus. °· You have a stiff neck. °· You have changes in your: °¨ Vision. °¨ Hearing. °¨ Thinking. °¨ Mood. °MAKE SURE YOU:  °· Understand these instructions. °· Will watch your condition. °· Will get help right away if you are not doing well or get worse. °  °This information is not intended to replace advice given to you by your health care provider. Make sure you discuss any questions you have with your health care provider. °  °Document Released: 09/01/2000 Document Revised: 07/23/2014 Document Reviewed: 06/13/2013 °Elsevier Interactive Patient Education ©2016 [employer] ° °

## 2015-02-02 LAB — CULTURE, GROUP A STREP

## 2015-03-07 ENCOUNTER — Emergency Department (HOSPITAL_COMMUNITY)
Admission: EM | Admit: 2015-03-07 | Discharge: 2015-03-08 | Disposition: A | Discharge status: Home / Self Care | Payer: Medicaid Other | Attending: Emergency Medicine | Admitting: Emergency Medicine

## 2015-03-07 ENCOUNTER — Encounter (HOSPITAL_COMMUNITY): Payer: Self-pay | Admitting: Emergency Medicine

## 2015-03-07 DIAGNOSIS — Z79899 Other long term (current) drug therapy: Secondary | ICD-10-CM | POA: Insufficient documentation

## 2015-03-07 DIAGNOSIS — K611 Rectal abscess: Secondary | ICD-10-CM

## 2015-03-07 DIAGNOSIS — Z8679 Personal history of other diseases of the circulatory system: Secondary | ICD-10-CM | POA: Insufficient documentation

## 2015-03-07 DIAGNOSIS — J45909 Unspecified asthma, uncomplicated: Secondary | ICD-10-CM | POA: Insufficient documentation

## 2015-03-07 DIAGNOSIS — K219 Gastro-esophageal reflux disease without esophagitis: Secondary | ICD-10-CM | POA: Insufficient documentation

## 2015-03-07 MED ORDER — OXYCODONE-ACETAMINOPHEN 5-325 MG PO TABS
1.0000 | ORAL_TABLET | Freq: Once | ORAL | Status: AC
  Administered 2015-03-07: 1 via ORAL
  Filled 2015-03-07: qty 1

## 2015-03-07 MED ORDER — LIDOCAINE HCL 1 % IJ SOLN
INTRAMUSCULAR | Status: AC
  Administered 2015-03-07
  Filled 2015-03-07: qty 20

## 2015-03-07 NOTE — ED Notes (Addendum)
Pt reports coming back from work  4 days ago with pain on right upper buttocks; on assessment 2 inch diameter hardened, non blanching area of redness; no drainage noted; no fever nor sx of infection pt co pain 20/10

## 2015-03-08 MED ORDER — OXYCODONE-ACETAMINOPHEN 5-325 MG PO TABS: 2.0000 | ORAL_TABLET | ORAL | Status: DC | PRN

## 2015-03-08 NOTE — ED Provider Notes (Signed)
CSN: [id number]     Arrival date & time 03/07/15  2218 History   First MD Initiated Contact with Patient 03/07/15 2310     Chief Complaint  Patient presents with  . Abscess     (Consider location/radiation/quality/duration/timing/severity/associated sxs/prior Treatment) HPI  [name] is a 34 y.o F with no significant pmhx who presents to the ED c/o abscess. Pt states that she noticed a boil on her R upper buttocks that has been presents over the last 4 days. Pain, redness and swelling are progressively worsening. Pt states that it hurts to sit down. No drainage noted. Denies fever, chills, vomiting, numbness/tingling.    Past Medical History  Diagnosis Date  . GERD (gastroesophageal reflux disease)   . H/O hiatal hernia   . PONV (postoperative nausea and vomiting)     nausea only  . Migraine   . Asthma     prn inhaler  . History of esophageal stricture     multiple esophageal dilations - states x 12  . Finger mass, right 03/2013    index finger   Past Surgical History  Procedure Laterality Date  . Esophagogastroduodenoscopy  07/07/2011  . Cesarean section  1999; 02/07/2003  . Esophagogastroduodenoscopy  06/16/2011    Procedure: ESOPHAGOGASTRODUODENOSCOPY (EGD);  Surgeon: Willis Modena, MD;  [facility];  Service: Endoscopy;  Laterality: N/A;  . Balloon dilation  06/16/2011    Procedure: BALLOON DILATION;  Surgeon: Willis Modena, MD;  Location: [facility];  Service: Endoscopy;  Laterality: N/A;  . Balloon dilation  07/07/2011    Procedure: BALLOON DILATION;  Surgeon: Willis Modena, MD;  Location: [facility];  Service: Endoscopy;  Laterality: N/A;  . Esophagogastroduodenoscopy  07/28/2011    Procedure: ESOPHAGOGASTRODUODENOSCOPY (EGD);  Surgeon: Willis Modena, MD;  Location: [facility];  Service: Endoscopy;  Laterality: N/A;  . Balloon dilation  07/28/2011    Procedure: BALLOON DILATION;  Surgeon: Willis Modena, MD;  Location: [facility];  Service: Endoscopy;   Laterality: N/A;  . Finger mass excision Left     index finger  . Mass excision Right 04/16/2013    Procedure: RIGHT INDEX EXCISION MASS;  Surgeon: Tami Ribas, MD;  Location: [facility];  Service: Orthopedics;  Laterality: Right;  . Tubal ligation    . Esophagogastroduodenoscopy (egd) with propofol N/A 06/26/2014    Procedure: ESOPHAGOGASTRODUODENOSCOPY (EGD) WITH PROPOFOL;  Surgeon: Wandalee Ferdinand, MD;  Location: [facility];  Service: Endoscopy;  Laterality: N/A;  . Foreign body removal N/A 06/26/2014    Procedure: FOREIGN BODY REMOVAL;  Surgeon: Wandalee Ferdinand, MD;  Location: [facility];  Service: Endoscopy;  Laterality: N/A;   Family History  Problem Relation Age of Onset  . Anesthesia problems Mother     post-op nausea   Social History  Substance Use Topics  . Smoking status: Never Smoker   . Smokeless tobacco: Never Used  . Alcohol Use: No   OB History    No data available     Review of Systems  All other systems reviewed and are negative.     Allergies  Codeine; Sucralfate; and Augmentin  Home Medications   Prior to Admission medications   Medication Sig Start Date End Date Taking? Authorizing Provider  acetaminophen (TYLENOL) 500 MG tablet Take 1,000 mg by mouth every 6 (six) hours as needed for moderate pain.   Yes Historical Provider, MD  albuterol (PROVENTIL HFA;VENTOLIN HFA) 108 (90 BASE) MCG/ACT inhaler Inhale into the lungs every 6 (six) hours as needed for wheezing  or shortness of breath.   Yes Historical Provider, MD  diclofenac sodium (VOLTAREN) 1 % GEL Apply 1 application topically 4 (four) times daily. Patient not taking: Reported on 03/08/2015 06/10/14   Hayden Rasmussenavid Mabe, NP  HYDROcodone-acetaminophen (NORCO/VICODIN) 5-325 MG per tablet Take 1 tablet by mouth every 6 (six) hours as needed for moderate pain. Patient not taking: Reported on 03/08/2015 06/22/14   Earley FavorGail Schulz, NP  hydrocortisone cream 1 % Apply to affected area 2 times daily Patient not  taking: Reported on 03/08/2015 07/10/14   Ria ClockJennifer Lee H Presson, PA  hydrOXYzine (ATARAX/VISTARIL) 25 MG tablet Take 1 tablet (25 mg total) by mouth 3 (three) times daily as needed for itching. Patient not taking: Reported on 03/08/2015 07/10/14   Ria ClockJennifer Lee H Presson, PA  omeprazole (PRILOSEC OTC) 20 MG tablet Take 1 tablet (20 mg total) by mouth 2 (two) times daily. Patient not taking: Reported on 03/08/2015 06/26/14   Graylin ShiverSalem F Ganem, MD  oxyCODONE-acetaminophen (PERCOCET/ROXICET) 5-325 MG tablet Take 2 tablets by mouth every 4 (four) hours as needed for severe pain. 03/08/15   Samantha Tripp Dowless, PA-C  traMADol (ULTRAM) 50 MG tablet Take 1 tablet (50 mg total) by mouth every 6 (six) hours as needed. Patient not taking: Reported on 03/08/2015 06/10/14   Hayden Rasmussenavid Mabe, NP   BP 110/78 mmHg  Pulse 74  Temp(Src) 97.8 F (36.6 C) (Oral)  Resp 18  SpO2 99%  LMP 03/03/2015 (Approximate) Physical Exam  Constitutional: She is oriented to person, place, and time. She appears well-developed and well-nourished. No distress.  HENT:  Head: Normocephalic and atraumatic.  Eyes: Conjunctivae are normal. Right eye exhibits no discharge. Left eye exhibits no discharge. No scleral icterus.  Cardiovascular: Normal rate.   Pulmonary/Chest: Effort normal.  Neurological: She is alert and oriented to person, place, and time. Coordination normal.  Skin: Skin is warm and dry. No rash noted. She is not diaphoretic. No erythema. No pallor.     Psychiatric: She has a normal mood and affect. Her behavior is normal.  Nursing note and vitals reviewed.   ED Course  Procedures (including critical care time)  INCISION AND DRAINAGE Performed by: Dub MikesSamantha Tripp Dowless Consent: Verbal consent obtained. Risks and benefits: risks, benefits and alternatives were discussed Type: abscess  Body area: perirectal  Anesthesia: local infiltration  Incision was made with a scalpel.  Local anesthetic: lidocaine 1% with  epinephrine  Anesthetic total: 5 ml  Complexity: complex Blunt dissection to break up loculations  Drainage: purulent  Drainage amount: minimal  Patient tolerance: Patient tolerated the procedure well with no immediate complications.    Labs Review Labs Reviewed - No data to display  Imaging Review No results found. I have personally reviewed and evaluated these images and lab results as part of my medical decision-making.   EKG Interpretation None      MDM   Final diagnoses:  Perirectal abscess    Patient with skin abscess amenable to incision and drainage.  Abscess was not large enough to warrant packing or drain,  wound recheck in 2 days. Encouraged home warm soaks and flushing.  Mild signs of cellulitis is surrounding skin.  Will d/c to home.  No antibiotic therapy is indicated. Return precautions outlined in patient discharge instructions.       Lester KinsmanSamantha Tripp [place] GapDow[place], PA-C 03/08/15 2357  Mancel BaleElliott Wentz, MD 03/08/15 [phone]

## 2015-03-08 NOTE — Discharge Instructions (Signed)
Perirectal Abscess An abscess is an infected area that contains a collection of pus. A perirectal abscess is an abscess that is near the opening of the anus or around the rectum. A perirectal abscess can cause a lot of pain, especially during bowel movements. CAUSES This condition is almost always caused by an infection that starts in an anal gland. RISK FACTORS This condition is more likely to develop in:  People with diabetes or inflammatory bowel disease.  People whose body defense system (immune system) is weak.  People who have anal sex.  People who have a sexually transmitted disease (STD).  People who have certain kinds of cancers, such as rectal carcinoma, leukemia, or lymphoma. SYMPTOMS The main symptom of this condition is pain. The pain may be a throbbing pain that gets worse during bowel movements. Other symptoms include:  Fever.  Swelling.  Redness.  Bleeding.  Constipation. DIAGNOSIS The condition is diagnosed with a physical exam. If the abscess is not visible, a health care provider may need to place a finger inside the rectum to find the abscess. Sometimes, imaging tests are done to determine the size and location of the abscess. These tests may include:  An ultrasound.  An MRI.  A CT scan. TREATMENT This condition is usually treated with incision and drainage surgery. Incision and drainage surgery involves making an incision over the abscess to drain the pus. Treatment may also involve antibiotic medicine, pain medicine, stool softeners, or laxatives. HOME CARE INSTRUCTIONS  Take medicines only as directed by your health care provider.  If you were prescribed an antibiotic, finish all of it even if you start to feel better.  To relieve pain, try sitting:  In a warm, shallow bath (sitz bath).  On a heating pad with the setting on low.  On an inflatable donut-shaped cushion.  Follow any diet instructions as directed by your health care  provider.  Keep all follow-up visits as directed by your health care provider. This is important. SEEK MEDICAL CARE IF:  Your abscess is bleeding.  You have pain, swelling, or redness that is getting worse.  You are constipated.  You feel ill.  You have muscle aches or chills.  You have a fever.  Your symptoms return after the abscess has healed.   This information is not intended to replace advice given to you by your health care provider. Make sure you discuss any questions you have with your health care provider.  Return to this department in 2 days for a wound recheck. Keep area clean and dry. May wash with soap and water. Recommend taking showers instead of baths while the wound heals. Return to the emergency department sooner if you experience severe increasing ear pain, fever, chills, increased redness or swelling around the affected area.

## 2015-03-18 ENCOUNTER — Emergency Department (HOSPITAL_COMMUNITY)
Admission: EM | Admit: 2015-03-18 | Discharge: 2015-03-18 | Disposition: A | Discharge status: Home / Self Care | Payer: Medicaid Other | Attending: Physician Assistant | Admitting: Physician Assistant

## 2015-03-18 ENCOUNTER — Encounter (HOSPITAL_COMMUNITY): Payer: Self-pay | Admitting: Emergency Medicine

## 2015-03-18 DIAGNOSIS — Z8679 Personal history of other diseases of the circulatory system: Secondary | ICD-10-CM | POA: Insufficient documentation

## 2015-03-18 DIAGNOSIS — L0501 Pilonidal cyst with abscess: Secondary | ICD-10-CM | POA: Insufficient documentation

## 2015-03-18 DIAGNOSIS — Z79899 Other long term (current) drug therapy: Secondary | ICD-10-CM | POA: Insufficient documentation

## 2015-03-18 DIAGNOSIS — K219 Gastro-esophageal reflux disease without esophagitis: Secondary | ICD-10-CM | POA: Insufficient documentation

## 2015-03-18 DIAGNOSIS — J45909 Unspecified asthma, uncomplicated: Secondary | ICD-10-CM | POA: Insufficient documentation

## 2015-03-18 MED ORDER — LIDOCAINE HCL 2 % IJ SOLN
20.0000 mL | Freq: Once | INTRAMUSCULAR | Status: AC
  Administered 2015-03-18: 400 mg
  Filled 2015-03-18: qty 20

## 2015-03-18 MED ORDER — SULFAMETHOXAZOLE-TRIMETHOPRIM 200-40 MG/5ML PO SUSP: 20.0000 mL | Freq: Two times a day (BID) | ORAL | Status: DC

## 2015-03-18 MED ORDER — ACETAMINOPHEN 160 MG/5ML PO SOLN: 500.0000 mg | Freq: Once | ORAL | Status: DC

## 2015-03-18 MED ORDER — OXYCODONE-ACETAMINOPHEN 5-325 MG PO TABS: 1.0000 | ORAL_TABLET | Freq: Once | ORAL | Status: DC

## 2015-03-18 MED ORDER — ONDANSETRON HCL 4 MG PO TABS: 4.0000 mg | ORAL_TABLET | Freq: Once | ORAL | Status: DC

## 2015-03-18 NOTE — ED Notes (Signed)
Pt states she was seen 3 wks ago for abscess on rt buttock.  States it hasn't gone away and now she has another one on the other side.

## 2015-03-18 NOTE — ED Provider Notes (Signed)
History  By signing my name below, I, Karle Plumber, attest that this documentation has been prepared under the direction and in the presence of Ivery Quale, PA-C. Electronically Signed: Karle Plumber, ED Scribe. 03/18/2015. 7:37 PM.  Chief Complaint  Patient presents with  . Abscess   The history is provided by the patient and medical records. No language interpreter was used.    HPI Comments:  [name] is a 34 y.o. female who presents to the Emergency Department complaining of an abscess to the left buttocks that began about two days ago. She states she had one located to the right buttocks three weeks ago that she had incised and drained that she reports healing. She has not taken anything for pain. Touching the area or sitting increases the pain. She denies modifying factors. She denies fever, chills, nausea, vomiting.   Past Medical History  Diagnosis Date  . GERD (gastroesophageal reflux disease)   . H/O hiatal hernia   . PONV (postoperative nausea and vomiting)     nausea only  . Migraine   . Asthma     prn inhaler  . History of esophageal stricture     multiple esophageal dilations - states x 12  . Finger mass, right 03/2013    index finger   Past Surgical History  Procedure Laterality Date  . Esophagogastroduodenoscopy  07/07/2011  . Cesarean section  1999; 02/07/2003  . Esophagogastroduodenoscopy  06/16/2011    Procedure: ESOPHAGOGASTRODUODENOSCOPY (EGD);  Surgeon: Willis Modena, MD;  Location: [facility];  Service: Endoscopy;  Laterality: N/A;  . Balloon dilation  06/16/2011    Procedure: BALLOON DILATION;  Surgeon: Willis Modena, MD;  Location: [facility];  Service: Endoscopy;  Laterality: N/A;  . Balloon dilation  07/07/2011    Procedure: BALLOON DILATION;  Surgeon: Willis Modena, MD;  Location: [facility];  Service: Endoscopy;  Laterality: N/A;  . Esophagogastroduodenoscopy  07/28/2011    Procedure: ESOPHAGOGASTRODUODENOSCOPY (EGD);  Surgeon:  Willis Modena, MD;  Location: [facility];  Service: Endoscopy;  Laterality: N/A;  . Balloon dilation  07/28/2011    Procedure: BALLOON DILATION;  Surgeon: Willis Modena, MD;  Location: [facility];  Service: Endoscopy;  Laterality: N/A;  . Finger mass excision Left     index finger  . Mass excision Right 04/16/2013    Procedure: RIGHT INDEX EXCISION MASS;  Surgeon: Tami Ribas, MD;  Location: [facility];  Service: Orthopedics;  Laterality: Right;  . Tubal ligation    . Esophagogastroduodenoscopy (egd) with propofol N/A 06/26/2014    Procedure: ESOPHAGOGASTRODUODENOSCOPY (EGD) WITH PROPOFOL;  Surgeon: Wandalee Ferdinand, MD;  Location: [facility];  Service: Endoscopy;  Laterality: N/A;  . Foreign body removal N/A 06/26/2014    Procedure: FOREIGN BODY REMOVAL;  Surgeon: Wandalee Ferdinand, MD;  Location: [facility];  Service: Endoscopy;  Laterality: N/A;   Family History  Problem Relation Age of Onset  . Anesthesia problems Mother     post-op nausea   Social History  Substance Use Topics  . Smoking status: Never Smoker   . Smokeless tobacco: Never Used  . Alcohol Use: No   OB History    No data available     Review of Systems  Constitutional: Negative for fever and chills.  Gastrointestinal: Negative for nausea and vomiting.  Skin: Positive for color change (abscess).  All other systems reviewed and are negative.   Allergies  Codeine; Sucralfate; and Augmentin  Home Medications   Prior to Admission medications   Medication Sig  Start Date End Date Taking? Authorizing Provider  acetaminophen (TYLENOL) 500 MG tablet Take 1,000 mg by mouth every 6 (six) hours as needed for moderate pain.    Historical Provider, MD  albuterol (PROVENTIL HFA;VENTOLIN HFA) 108 (90 BASE) MCG/ACT inhaler Inhale into the lungs every 6 (six) hours as needed for wheezing or shortness of breath.    Historical Provider, MD  diclofenac sodium (VOLTAREN) 1 % GEL Apply 1 application topically 4 (four) times  daily. Patient not taking: Reported on 03/08/2015 06/10/14   Hayden Rasmussenavid Mabe, NP  HYDROcodone-acetaminophen (NORCO/VICODIN) 5-325 MG per tablet Take 1 tablet by mouth every 6 (six) hours as needed for moderate pain. Patient not taking: Reported on 03/08/2015 06/22/14   Earley FavorGail Schulz, NP  hydrocortisone cream 1 % Apply to affected area 2 times daily Patient not taking: Reported on 03/08/2015 07/10/14   Ria ClockJennifer Lee H Presson, PA  hydrOXYzine (ATARAX/VISTARIL) 25 MG tablet Take 1 tablet (25 mg total) by mouth 3 (three) times daily as needed for itching. Patient not taking: Reported on 03/08/2015 07/10/14   Ria ClockJennifer Lee H Presson, PA  omeprazole (PRILOSEC OTC) 20 MG tablet Take 1 tablet (20 mg total) by mouth 2 (two) times daily. Patient not taking: Reported on 03/08/2015 06/26/14   Graylin ShiverSalem F Ganem, MD  oxyCODONE-acetaminophen (PERCOCET/ROXICET) 5-325 MG tablet Take 2 tablets by mouth every 4 (four) hours as needed for severe pain. 03/08/15   Samantha Tripp Dowless, PA-C  traMADol (ULTRAM) 50 MG tablet Take 1 tablet (50 mg total) by mouth every 6 (six) hours as needed. Patient not taking: Reported on 03/08/2015 06/10/14   Hayden Rasmussenavid Mabe, NP   Triage Vitals: BP 138/82 mmHg  Pulse 99  Temp(Src) 98.4 F (36.9 C) (Oral)  Resp 16  SpO2 97%  LMP 03/03/2015 (Approximate) Physical Exam  Constitutional: She is oriented to person, place, and time. She appears well-developed and well-nourished.  HENT:  Head: Normocephalic and atraumatic.  Eyes: EOM are normal.  Neck: Normal range of motion.  Cardiovascular: Normal rate, regular rhythm and normal heart sounds.  Exam reveals no gallop and no friction rub.   No murmur heard. Pulmonary/Chest: Effort normal and breath sounds normal. No respiratory distress. She has no wheezes. She has no rales.  Musculoskeletal: Normal range of motion.  Neurological: She is alert and oriented to person, place, and time.  Skin: Skin is warm and dry. There is erythema.  Well-healed surgical  scar at upper right gluteal cleft. Some palpable scar tissue still present from the abscess. Fluctuant abscess to left upper gluteal cleft with surrounding erythema. No red streaking. Warm to touch and tender to palpation.  Psychiatric: She has a normal mood and affect. Her behavior is normal.  Nursing note and vitals reviewed.   ED Course  Procedures (including critical care time) DIAGNOSTIC STUDIES: Oxygen Saturation is 97% on RA, normal by my interpretation.   COORDINATION OF CARE: 7:43 PM- Informed pt that she has a pilonidal cyst. Will I & D area. Will give referral to surgeon. Pt verbalizes understanding and agrees to plan.  INCISION AND DRAINAGE PROCEDURE NOTE: Patient identification was confirmed and verbal consent was obtained. This procedure was performed by Ivery QualeHobson Cristofer Yaffe, PA-C at 7:49 PM. Site: left upper gluteal cleft Sterile procedures observed Needle size: 25 G Anesthetic used (type and amt): Lidocaine **2*% (*8** mLs) Blade size: 11 Drainage: *moderate** Complexity: Complex Packing used: *none** Site anesthetized, incision made over site, wound drained and explored loculations, rinsed with copious amounts of normal saline, covered with  dry, sterile dressing. Pt tolerated procedure well without complications. Instructions for care discussed verbally and pt provided with additional written instructions for homecare and f/u.  Medications - No data to display   MDM  Vital signs reviewed. The examination favors a pilonidal cyst. Incision and drainage carried out, and culture sent to the lab. Patient states that she cannot take narcotic medications, and will use Tylenol for her pain. The patient will use Septra liquid she has problems with pills. Patient referred to Dr. Maisie Fus for surgical evaluation. Patient in agreement with this discharge plan.    Final diagnoses:  None    *I have reviewed nursing notes, vital signs, and all appropriate lab and imaging results for  this patient.**  I personally performed the services described in this documentation, which was scribed in my presence. The recorded information has been reviewed and is accurate.    Ivery Quale, PA-C 03/18/15 2024  Courteney Randall An, MD 03/18/15 2352

## 2015-03-18 NOTE — Discharge Instructions (Signed)
Please see Dr. Maisie Fushomas or member of the surgical team for additional evaluation concerning your pilonidal cyst and abscess. Please soak in a tub of warm Epsom salt water for 15 minutes daily until resolved. Please use 20 mL of the Septra 2 times daily with food. Pilonidal Cyst A pilonidal cyst is a fluid-filled sac. It forms beneath the skin near your tailbone, at the top of the crease of your buttocks. A pilonidal cyst that is not large or infected may not cause symptoms or problems. If the cyst becomes irritated or infected, it may fill with pus. This causes pain and swelling (pilonidal abscess). An infected cyst may need to be treated with medicine, drained, or removed. CAUSES The cause of a pilonidal cyst is not known. One cause may be a hair that grows into your skin (ingrown hair). RISK FACTORS Pilonidal cysts are more common in boys and men. Risk factors include:  Having lots of hair near the crease of the buttocks.  Being overweight.  Having a pilonidal dimple.  Wearing tight clothing.  Not bathing or showering frequently.  Sitting for long periods of time. SIGNS AND SYMPTOMS Signs and symptoms of a pilonidal cyst may include:  Redness.  Pain and tenderness.  Warmth.  Swelling.  Pus.  Fever. DIAGNOSIS Your health care provider may diagnose a pilonidal cyst based on your symptoms and a physical exam. The health care provider may do a blood test to check for infection. If your cyst is draining pus, your health care provider may take a sample of the drainage to be tested at a laboratory. TREATMENT Surgery is the usual treatment for an infected pilonidal cyst. You may also have to take medicines before surgery. The type of surgery you have depends on the size and severity of the infected cyst. The different kinds of surgery include:  Incision and drainage. This is a procedure to open and drain the cyst.  Marsupialization. In this procedure, a large cyst or abscess may be  opened and kept open by stitching the edges of the skin to the cyst walls.  Cyst removal. This procedure involves opening the skin and removing all or part of the cyst. HOME CARE INSTRUCTIONS  Follow all of your surgeon's instructions carefully if you had surgery.  Take medicines only as directed by your health care provider.  If you were prescribed an antibiotic medicine, finish it all even if you start to feel better.  Keep the area around your pilonidal cyst clean and dry.  Clean the area as directed by your health care provider. Pat the area dry with a clean towel. Do not rub it as this may cause bleeding.  Remove hair from the area around the cyst as directed by your health care provider.  Do not wear tight clothing or sit in one place for long periods of time.  There are many different ways to close and cover an incision, including stitches, skin glue, and adhesive strips. Follow your health care provider's instructions on:  Incision care.  Bandage (dressing) changes and removal.  Incision closure removal. SEEK MEDICAL CARE IF:   You have drainage, redness, swelling, or pain at the site of the cyst.  You have a fever.   This information is not intended to replace advice given to you by your health care provider. Make sure you discuss any questions you have with your health care provider.   Document Released: 03/05/2000 Document Revised: 03/29/2014 Document Reviewed: 07/26/2013 Elsevier Interactive Patient Education 2016 Elsevier  Inc. ° °

## 2015-03-21 ENCOUNTER — Telehealth (HOSPITAL_COMMUNITY): Payer: Self-pay

## 2015-03-21 LAB — CULTURE, ROUTINE-ABSCESS: SPECIAL REQUESTS: NORMAL

## 2015-03-21 NOTE — Telephone Encounter (Signed)
[facility].  (+) MRSA given Rx for Sulfa Trimeth -> sensitive to the same.  Awaiting pharmacy recommendations.

## 2015-06-12 ENCOUNTER — Ambulatory Visit (INDEPENDENT_AMBULATORY_CARE_PROVIDER_SITE_OTHER): Payer: BLUE CROSS/BLUE SHIELD | Admitting: Physician Assistant

## 2015-06-12 VITALS — BP 118/76 | HR 76 | Temp 98.3°F | Resp 16 | Ht 62.0 in | Wt 144.0 lb

## 2015-06-12 DIAGNOSIS — R070 Pain in throat: Secondary | ICD-10-CM | POA: Diagnosis not present

## 2015-06-12 DIAGNOSIS — R6889 Other general symptoms and signs: Secondary | ICD-10-CM

## 2015-06-12 DIAGNOSIS — R05 Cough: Secondary | ICD-10-CM | POA: Diagnosis not present

## 2015-06-12 DIAGNOSIS — R059 Cough, unspecified: Secondary | ICD-10-CM

## 2015-06-12 LAB — POCT INFLUENZA A/B
Influenza A, POC: NEGATIVE
Influenza B, POC: NEGATIVE

## 2015-06-12 LAB — POCT RAPID STREP A (OFFICE): Rapid Strep A Screen: NEGATIVE

## 2015-06-12 MED ORDER — BENZONATATE 100 MG PO CAPS: 100.0000 mg | ORAL_CAPSULE | Freq: Three times a day (TID) | ORAL | Status: DC | PRN

## 2015-06-12 NOTE — Progress Notes (Signed)
[facility] [address], [place] [address] [phone]  Date:  06/12/2015   Name:  [name]   DOB:  [birth year]   MRN:  [id number]  PCP:  PROVIDER NOT IN SYSTEM    History of Present Illness:  Allison BadSonya R Serrano is a 35 y.o. female patient who [facility] for chief complaint of body aches, headache, throat pain, and cough.  2 days ago, she developed a Serrano headache, bodyaches, throat pain.  No fever.  She had the flu 1 month ago.  She is nauseous in the morning.  No productive cough.  She has trouble breathing only with coughing.  She has taken mucinex which is not helping much.  Father has the flu at this time, which she is has plenty of exposure.  She also works at a day care.     She did not have a flu vaccine.  Patient Active Problem List   Diagnosis Date Noted  . ASTHMA 11/14/2008  . GERD 11/14/2008  . DYSPHAGIA UNSPECIFIED 11/14/2008    Past Medical History  Diagnosis Date  . GERD (gastroesophageal reflux disease)   . H/O hiatal hernia   . PONV (postoperative nausea and vomiting)     nausea only  . Migraine   . Asthma     prn inhaler  . History of esophageal stricture     multiple esophageal dilations - states x 12  . Finger mass, right 03/2013    index finger    Past Surgical History  Procedure Laterality Date  . Esophagogastroduodenoscopy  07/07/2011  . Cesarean section  1999; 02/07/2003  . Esophagogastroduodenoscopy  06/16/2011    Procedure: ESOPHAGOGASTRODUODENOSCOPY (EGD);  Surgeon: Willis ModenaWilliam Outlaw, MD;  [facility];  Service: Endoscopy;  Laterality: N/A;  . Balloon dilation  06/16/2011    Procedure: BALLOON DILATION;  Surgeon: Willis ModenaWilliam Outlaw, MD;  Location: [facility];  Service: Endoscopy;  Laterality: N/A;  . Balloon dilation  07/07/2011    Procedure: BALLOON DILATION;  Surgeon: Willis ModenaWilliam Outlaw, MD;  Location: [facility];  Service: Endoscopy;  Laterality: N/A;  . Esophagogastroduodenoscopy  07/28/2011    Procedure:  ESOPHAGOGASTRODUODENOSCOPY (EGD);  Surgeon: Willis ModenaWilliam Outlaw, MD;  [facility];  Service: Endoscopy;  Laterality: N/A;  . Balloon dilation  07/28/2011    Procedure: BALLOON DILATION;  Surgeon: Willis ModenaWilliam Outlaw, MD;  Location: [facility];  Service: Endoscopy;  Laterality: N/A;  . Finger mass excision Left     index finger  . Mass excision Right 04/16/2013    Procedure: RIGHT INDEX EXCISION MASS;  Surgeon: Tami RibasKevin R Kuzma, MD;  Location: [facility];  Service: Orthopedics;  Laterality: Right;  . Tubal ligation    . Esophagogastroduodenoscopy (egd) with propofol N/A 06/26/2014    Procedure: ESOPHAGOGASTRODUODENOSCOPY (EGD) WITH PROPOFOL;  Surgeon: Wandalee FerdinandSam Ganem, MD;  [facility];  Service: Endoscopy;  Laterality: N/A;  . Foreign body removal N/A 06/26/2014    Procedure: FOREIGN BODY REMOVAL;  Surgeon: Wandalee FerdinandSam Ganem, MD;  [facility];  Service: Endoscopy;  Laterality: N/A;    Social History  Substance Use Topics  . Smoking status: Never Smoker   . Smokeless tobacco: Never Used  . Alcohol Use: No    Family History  Problem Relation Age of Onset  . Anesthesia problems Mother     post-op nausea    Allergies  Allergen Reactions  . Codeine Nausea And Vomiting  . Sucralfate Nausea And Vomiting  . Augmentin [Amoxicillin-Pot Clavulanate]  Reports she had an allergic reaction     Medication list has been reviewed and updated.  Current Outpatient Prescriptions on File Prior to Visit  Medication Sig Dispense Refill  . albuterol (PROVENTIL HFA;VENTOLIN HFA) 108 (90 BASE) MCG/ACT inhaler Inhale into the lungs every 6 (six) hours as needed for wheezing or shortness of breath.     No current facility-administered medications on file prior to visit.    ROS ROS otherwise unremarkable unless listed above.  Physical Examination: BP 118/76 mmHg  Pulse 76  Temp(Src) 98.3 F (36.8 C) (Oral)  Resp 16  Ht  (1.575 m)  Wt 144 lb (65.318 kg)  BMI 26.33  kg/m2  SpO2 96%  LMP 05/21/2015 (Approximate) Ideal Body Weight: Weight in (lb) to have BMI = 25: 136.4  Physical Exam  Constitutional: She is oriented to person, place, and time. She appears well-developed and well-nourished. No distress.  HENT:  Head: Normocephalic and atraumatic.  Right Ear: Tympanic membrane, external ear and ear canal normal.  Left Ear: Tympanic membrane, external ear and ear canal normal.  Nose: Mucosal edema and rhinorrhea present. Right sinus exhibits no maxillary sinus tenderness and no frontal sinus tenderness. Left sinus exhibits no maxillary sinus tenderness and no frontal sinus tenderness.  Mouth/Throat: No uvula swelling. No oropharyngeal exudate, posterior oropharyngeal edema or posterior oropharyngeal erythema.  Eyes: Conjunctivae and EOM are normal. Pupils are equal, round, and reactive to light.  Cardiovascular: Normal rate and regular rhythm.  Exam reveals no gallop, no distant heart sounds and no friction rub.   No murmur heard. Pulmonary/Chest: Effort normal. No respiratory distress. She has no decreased breath sounds. She has no wheezes. She has no rhonchi.  Lymphadenopathy:       Head (right side): No submandibular, no tonsillar, no preauricular and no posterior auricular adenopathy present.       Head (left side): No submandibular, no tonsillar, no preauricular and no posterior auricular adenopathy present.  Neurological: She is alert and oriented to person, place, and time.  Skin: She is not diaphoretic.  Psychiatric: She has a normal mood and affect. Her behavior is normal.    Results for orders placed or performed in visit on 06/12/15  POCT rapid strep A  Result Value Ref Range   Rapid Strep A Screen Negative Negative  POCT Influenza A/B  Result Value Ref Range   Influenza A, POC Negative Negative   Influenza B, POC Negative Negative    Assessment and Plan: Frayda Egley Serrano is a 35 y.o. female who is here today  --this is likely a virus.  i  have advised her to treat supportively.  Advised heavy hydration, rest, and anti-pyretic use.  She will contact me if her sxs continue. --she will contact me if she continues to have sxs after 8 days without improvement.  If sxs continue, she may have a zpak.  Flu-like symptoms - Plan: POCT Influenza A/B  Throat pain - Plan: POCT rapid strep A, Culture, Group A Strep  Cough - Plan: benzonatate (TESSALON) 100 MG capsule   Trena Platt, PA-C [facility] 06/12/2015 12:24 PM

## 2015-06-12 NOTE — Patient Instructions (Addendum)
     IF you received an x-ray today, you will receive an invoice from [facility]. Please contact [facility] at [phone] with questions or concerns regarding your invoice.   IF you received labwork today, you will receive an invoice from [facility]. Please contact [facility] at [phone] with questions or concerns regarding your invoice.   Our billing staff will not be able to assist you with questions regarding bills from these companies.  You will be contacted with the lab results as soon as they are available. The fastest way to get your results is to activate your My Chart account. Instructions are located on the last page of this paperwork. If you have not heard from us regarding the results in 2 weeks, please contact this office.    Please take tylenol or ibuprofen for fever and pain. Please hydrate as much as possible with over 64 oz of water per day. Continue taking mucinex 1200mg  every 12 hours.  Please let me know if you do not have improvement in the next 8 days.  You can use delsym for the cough

## 2015-06-14 LAB — CULTURE, GROUP A STREP: ORGANISM ID, BACTERIA: NORMAL

## 2015-06-29 ENCOUNTER — Ambulatory Visit (INDEPENDENT_AMBULATORY_CARE_PROVIDER_SITE_OTHER): Payer: BLUE CROSS/BLUE SHIELD | Admitting: Internal Medicine

## 2015-06-29 VITALS — BP 112/72 | HR 76 | Temp 98.0°F | Resp 16 | Ht 62.0 in | Wt 143.0 lb

## 2015-06-29 DIAGNOSIS — R05 Cough: Secondary | ICD-10-CM

## 2015-06-29 DIAGNOSIS — J029 Acute pharyngitis, unspecified: Secondary | ICD-10-CM

## 2015-06-29 DIAGNOSIS — J0100 Acute maxillary sinusitis, unspecified: Secondary | ICD-10-CM | POA: Diagnosis not present

## 2015-06-29 DIAGNOSIS — R059 Cough, unspecified: Secondary | ICD-10-CM

## 2015-06-29 LAB — POCT RAPID STREP A (OFFICE): Rapid Strep A Screen: NEGATIVE

## 2015-06-29 MED ORDER — AMOXICILLIN 400 MG/5ML PO SUSR: 800.0000 mg | Freq: Two times a day (BID) | ORAL | Status: DC

## 2015-06-29 NOTE — Addendum Note (Signed)
Addended by: Damita LackLORING, Versia Mignogna S on: 06/29/2015 12:48 PM   Modules accepted: Orders

## 2015-06-29 NOTE — Patient Instructions (Signed)
     IF you received an x-ray today, you will receive an invoice from [facility]. Please contact [facility] at [phone] with questions or concerns regarding your invoice.   IF you received labwork today, you will receive an invoice from [facility]. Please contact [facility] at [phone] with questions or concerns regarding your invoice.   Our billing staff will not be able to assist you with questions regarding bills from these companies.  You will be contacted with the lab results as soon as they are available. The fastest way to get your results is to activate your My Chart account. Instructions are located on the last page of this paperwork. If you have not heard from us regarding the results in 2 weeks, please contact this office.      

## 2015-06-29 NOTE — Progress Notes (Signed)
Subjective:  By signing my name below, I, Stann Ore, attest that this documentation has been prepared under the direction and in the presence of Ellamae Sia, MD. Electronically Signed: Stann Ore, Scribe. 06/29/2015 , 12:02 PM .  Patient was seen in Room 1 .   Patient ID: [name], female    DOB: 10-19-1980, 35 y.o.   MRN: [id number] Chief Complaint  Patient presents with  . Sore Throat    ? strep throat.  sick all week.  sore throat   HPI [name] is a 35 y.o. female who presents to [facility] complaining of sore throat ongoing for about a week. She states that it mostly feels sore on the right side. She's been having coughs and trouble swallowing. She was seen recently (06/12/15) for flu-like symptoms. She's tried taking mucinex after the tamiflu. She also mentions not being able to breathe through her nose when laying down at night due to nasal congestion. She also reports her coughs waking her and her husband up at night. She denies shortness of breath or chest tightness.   Patient Active Problem List   Diagnosis Date Noted  . Anxiety and depression 11/30/2011  . 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 EE (eosinophilic esophagitis) 07/26/2011  . ASTHMA 11/14/2008  . GERD 11/14/2008  . DYSPHAGIA UNSPECIFIED 11/14/2008    Current outpatient prescriptions:  .  albuterol (PROVENTIL HFA;VENTOLIN HFA) 108 (90 BASE) MCG/ACT inhaler, Inhale into the lungs every 6 (six) hours as needed for wheezing or shortness of breath., Disp: , Rfl:  .  benzonatate (TESSALON) 100 MG capsule, Take 1-2 capsules (100-200 mg total) by mouth 3 (three) times daily as needed for cough., Disp: 40 capsule, Rfl: 0 .  Rizatriptan Benzoate (MAXALT PO), Take by mouth., Disp: , Rfl:   Review of Systems  Constitutional: Positive for fatigue. Negative for fever and chills.  HENT: Positive for congestion, rhinorrhea, sore throat and trouble swallowing.   Respiratory: Positive for cough. Negative for chest  tightness, shortness of breath and wheezing.   Gastrointestinal: Negative for nausea, vomiting and diarrhea.      Objective:   Physical Exam  Constitutional: She is oriented to person, place, and time. She appears well-developed and well-nourished. No distress.  HENT:  Head: Normocephalic and atraumatic.  Right Ear: Tympanic membrane normal.  Left Ear: Tympanic membrane normal.  Mouth/Throat: Posterior oropharyngeal erythema present.  Nares boggy   Eyes: EOM are normal. Pupils are equal, round, and reactive to light.  Neck: Neck supple.  Cardiovascular: Normal rate.   Pulmonary/Chest: Effort normal and breath sounds normal. No respiratory distress. She has no wheezes.  Musculoskeletal: Normal range of motion.  Lymphadenopathy:    She has cervical adenopathy (right anterior).  Neurological: She is alert and oriented to person, place, and time.  Skin: Skin is warm and dry.  Psychiatric: She has a normal mood and affect. Her behavior is normal.  Nursing note and vitals reviewed.  BP 112/72 mmHg  Pulse 76  Temp(Src) 98 F (36.7 C) (Oral)  Resp 16  Ht  (1.575 m)  Wt 143 lb (64.864 kg)  BMI 26.15 kg/m2  LMP 06/24/2015   Results for orders placed or performed in visit on 06/29/15  POCT rapid strep A  Result Value Ref Range   Rapid Strep A Screen Negative Negative      Assessment & Plan:  I have completed the patient encounter in its entirety as documented by the scribe, with editing by me where necessary.  Joseph Bias P. Merla Richesoolittle, M.D. Sore throat - Plan: POCT rapid strep A  Acute maxillary sinusitis, recurrence not specified  Cough  Meds ordered this encounter  Medications  . amoxicillin (AMOXIL) 400 MG/5ML suspension    Sig: Take 10 mLs (800 mg total) by mouth 2 (two) times daily.    Dispense:  200 mL    Refill:  0

## 2015-07-01 LAB — CULTURE, GROUP A STREP: Organism ID, Bacteria: NORMAL

## 2015-07-04 ENCOUNTER — Other Ambulatory Visit: Payer: Self-pay | Admitting: Family Medicine

## 2015-07-04 ENCOUNTER — Ambulatory Visit (INDEPENDENT_AMBULATORY_CARE_PROVIDER_SITE_OTHER): Payer: BLUE CROSS/BLUE SHIELD | Admitting: Family Medicine

## 2015-07-04 VITALS — BP 112/70 | HR 96 | Temp 98.0°F | Resp 18 | Ht 62.0 in | Wt 144.0 lb

## 2015-07-04 DIAGNOSIS — Z13220 Encounter for screening for lipoid disorders: Secondary | ICD-10-CM | POA: Diagnosis not present

## 2015-07-04 DIAGNOSIS — Z0000 Encounter for general adult medical examination without abnormal findings: Secondary | ICD-10-CM

## 2015-07-04 DIAGNOSIS — R635 Abnormal weight gain: Secondary | ICD-10-CM

## 2015-07-04 DIAGNOSIS — K2100 Gastro-esophageal reflux disease with esophagitis, without bleeding: Secondary | ICD-10-CM

## 2015-07-04 DIAGNOSIS — Z1329 Encounter for screening for other suspected endocrine disorder: Secondary | ICD-10-CM

## 2015-07-04 DIAGNOSIS — K200 Eosinophilic esophagitis: Secondary | ICD-10-CM

## 2015-07-04 DIAGNOSIS — J4520 Mild intermittent asthma, uncomplicated: Secondary | ICD-10-CM | POA: Diagnosis not present

## 2015-07-04 DIAGNOSIS — Z130 Encounter for screening for diseases of the blood and blood-forming organs and certain disorders involving the immune mechanism: Secondary | ICD-10-CM

## 2015-07-04 NOTE — Patient Instructions (Addendum)
I will refer you to another gastroenterologist for a second opinion.  If swelling in legs persists, return to discuss further.  Keeping You Healthy  Get These Tests 1. Blood Pressure- Have your blood pressure checked once a year by your health care provider.  Normal blood pressure is 120/80. 2. Weight- Have your body mass index (BMI) calculated to screen for obesity.  BMI is measure of body fat based on height and weight.  You can also calculate your own BMI at https://www.west-esparza.com/www.nhlbisupport.com/bmi/. 3. Cholesterol- Have your cholesterol checked every 5 years starting at age 36 then yearly starting at age 36. 4. Chlamydia, HIV, and other sexually transmitted diseases- Get screened every year until age 36, then within three months of each new sexual provider. 5. Pap Test - Every 1-5 years; discuss with your health care provider. 6. Mammogram- Every 1-2 years starting at age 10140--50  Take these medicines  Calcium with Vitamin D-Your body needs 1200 mg of Calcium each day and [phone] IU of Vitamin D daily.  Your body can only absorb 500 mg of Calcium at a time so Calcium must be taken in 2 or 3 divided doses throughout the day.  Multivitamin with folic acid- Once daily if it is possible for you to become pregnant.  Get these Immunizations  Gardasil-Series of three doses; prevents HPV related illness such as genital warts and cervical cancer.  Menactra-Single dose; prevents meningitis.  Tetanus shot- Every 10 years.  Flu shot-Every year.  Take these steps 1. Do not smoke-Your healthcare provider can help you quit.  For tips on how to quit go to www.smokefree.gov or call 1-800 QUITNOW. 2. Be physically active- Exercise 5 days a week for at least 30 minutes.  If you are not already physically active, start slow and gradually work up to 30 minutes of moderate physical activity.  Examples of moderate activity include walking briskly, dancing, swimming, bicycling, etc. 3. Breast Cancer- A self breast exam  every month is important for early detection of breast cancer.  For more information and instruction on self breast exams, ask your healthcare provider or [place].eswww.womenshealth.gov/faq/breast-self-exam.cfm. 4. Eat a healthy diet- Eat a variety of healthy foods such as fruits, vegetables, whole grains, low fat milk, low fat cheeses, yogurt, lean meats, poultry and fish, beans, nuts, tofu, etc.  For more information go to www. Thenutritionsource.org 5. Drink alcohol in moderation- Limit alcohol intake to one drink or less per day. Never drink and drive. 6. Depression- Your emotional health is as important as your physical health.  If you're feeling down or losing interest in things you normally enjoy please talk to your healthcare provider about being screened for depression. 7. Dental visit- Brush and floss your teeth twice daily; visit your dentist twice a year. 8. Eye doctor- Get an eye exam at least every 2 years. 9. Helmet use- Always wear a helmet when riding a bicycle, motorcycle, rollerblading or skateboarding. 10. Safe sex- If you may be exposed to sexually transmitted infections, use a condom. 11. Seat belts- Seat belts can save your live; always wear one. 12. Smoke/Carbon Monoxide detectors- These detectors need to be installed on the appropriate level of your home. Replace batteries at least once a year. 13. Skin cancer- When out in the sun please cover up and use sunscreen 15 SPF or higher. 14. Violence- If anyone is threatening or hurting you, please tell your healthcare provider.          IF you received an x-ray today, you  will receive an invoice from [facility]. Please contact [facility] at [phone] with questions or concerns regarding your invoice.   IF you received labwork today, you will receive an invoice from [employer]. Please contact [facility] at [phone] with questions or concerns regarding your invoice.   Our billing  staff will not be able to assist you with questions regarding bills from these companies.  You will be contacted with the lab results as soon as they are available. The fastest way to get your results is to activate your My Chart account. Instructions are located on the last page of this paperwork. If you have not heard from [place] regarding the results in 2 weeks, please contact this office.

## 2015-07-04 NOTE — Progress Notes (Signed)
Subjective:  By signing my name below, I, [name], attest that this documentation has been prepared under the direction and in the presence of Meredith StaggersJeffrey Azlaan Isidore, MD.  Electronically Signed: Andrew Auaven Serrano, ED Scribe. 06/30/2015. 1:14 PM.   Patient ID: [name], female    DOB: 1981/02/11, 35 y.o.   MRN: [id number]  HPI   Chief Complaint  Patient presents with  . Annual Exam   HPI Comments: [name] is a 35 y.o. female who presents to the [facility] for a physical for work. Pt works with toddlers at a daycare.  Cancer screening Pap smear- normal last year She has family hx of breast cancer of her cousin  Immunization  There is no immunization history on file for this patient. Pt declines flu shot Tetanus- last received this 2 years ago after having a finger injury.   Depression [facility] 07/04/2015 06/29/2015 06/12/2015  Decreased Interest 0 0 0  Down, Depressed, Hopeless 0 0 0  PHQ - 2 Score 0 0 0   Vision  Visual Acuity Screening   Right eye Left eye Both eyes  Without correction: 20/20 20/20 20/20   With correction:      Pt wears glasses. Her last appointment with optho was 6 months ago.  Dentist  She was last seen by dentist 8 months ago.   Exercise Pt goes to the gym about 3 days a week.   Hx of cluster HA  She is on Maxalt but only takes this as needed about once every 6 months. She uses tylenol     Hx of GERD and eosinophilic esophagitis Pt reports symptoms of trouble swallowing when eating meats, breads, salad and medications. She was seeing GI, [facility] Thy[facility] but is currently looking for a new GI physician. She felt that her last provider did too many esophageal dilations. She reports hx of hiatal hernia.   Hx of asthma Pt is on albuterol but only uses as needed. She very rarely has to use this twice a week.   Ankle swelling Pt has noticed bilateral ankle swelling over the past 1 month. She has noticed an  increase in her weight stating that last year she was 115 lbs. She denies CP and palpitation. She denies hx of anemia and low blood counts.   Pt is not fasting.   Patient Active Problem List   Diagnosis Date Noted  . Anxiety and depression 11/30/2011  . 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 EE (eosinophilic esophagitis) 07/26/2011  . ASTHMA 11/14/2008  . GERD 11/14/2008  . DYSPHAGIA UNSPECIFIED 11/14/2008   Past Medical History  Diagnosis Date  . GERD (gastroesophageal reflux disease)   . H/O hiatal hernia   . PONV (postoperative nausea and vomiting)     nausea only  . Migraine   . Asthma     prn inhaler  . History of esophageal stricture     multiple esophageal dilations - states x 12  . Finger mass, right 03/2013    index finger   Past Surgical History  Procedure Laterality Date  . Esophagogastroduodenoscopy  07/07/2011  . Cesarean section  1999; 02/07/2003  . Esophagogastroduodenoscopy  06/16/2011    Procedure: ESOPHAGOGASTRODUODENOSCOPY (EGD);  Surgeon: Willis ModenaWilliam Outlaw, MD;  [facility];  Service: Endoscopy;  Laterality: N/A;  . Balloon dilation  06/16/2011    Procedure: BALLOON DILATION;  Surgeon: Willis ModenaWilliam Outlaw, MD;  Location: [facility];  Service: Endoscopy;  Laterality: N/A;  . Balloon  dilation  07/07/2011    Procedure: BALLOON DILATION;  Surgeon: Willis Modena, MD;  Location: [facility];  Service: Endoscopy;  Laterality: N/A;  . Esophagogastroduodenoscopy  07/28/2011    Procedure: ESOPHAGOGASTRODUODENOSCOPY (EGD);  Surgeon: Willis Modena, MD;  [facility];  Service: Endoscopy;  Laterality: N/A;  . Balloon dilation  07/28/2011    Procedure: BALLOON DILATION;  Surgeon: Willis Modena, MD;  Location: [facility];  Service: Endoscopy;  Laterality: N/A;  . Finger mass excision Left     index finger  . Mass excision Right 04/16/2013    Procedure: RIGHT INDEX EXCISION MASS;  Surgeon: Tami Ribas, MD;  Location: [facility];  Service:  Orthopedics;  Laterality: Right;  . Tubal ligation    . Esophagogastroduodenoscopy (egd) with propofol N/A 06/26/2014    Procedure: ESOPHAGOGASTRODUODENOSCOPY (EGD) WITH PROPOFOL;  Surgeon: Wandalee Ferdinand, MD;  Location: [facility] ENDOSCOPY;  Service: Endoscopy;  Laterality: N/A;  . Foreign body removal N/A 06/26/2014    Procedure: FOREIGN BODY REMOVAL;  Surgeon: Wandalee Ferdinand, MD;  [facility];  Service: Endoscopy;  Laterality: N/A;   Allergies  Allergen Reactions  . Codeine Nausea And Vomiting  . Sucralfate Nausea And Vomiting  . Augmentin [Amoxicillin-Pot Clavulanate]     Reports she had an allergic reaction    Prior to Admission medications   Medication Sig Start Date End Date Taking? Authorizing Provider  albuterol (PROVENTIL HFA;VENTOLIN HFA) 108 (90 BASE) MCG/ACT inhaler Inhale into the lungs every 6 (six) hours as needed for wheezing or shortness of breath.   Yes Historical Provider, MD  amoxicillin (AMOXIL) 400 MG/5ML suspension Take 10 mLs (800 mg total) by mouth 2 (two) times daily. 06/29/15  Yes Tonye Pearson, MD  benzonatate (TESSALON) 100 MG capsule Take 1-2 capsules (100-200 mg total) by mouth 3 (three) times daily as needed for cough. 06/12/15  Yes Stephanie D English, PA  Rizatriptan Benzoate (MAXALT PO) Take by mouth.   Yes Historical Provider, MD   Social History   Social History  . Marital Status: Married    Spouse Name: N/A  . Number of Children: N/A  . Years of Education: N/A   Occupational History  . Not on file.   Social History Main Topics  . Smoking status: Never Smoker   . Smokeless tobacco: Never Used  . Alcohol Use: No  . Drug Use: No  . Sexual Activity: Not on file   Other Topics Concern  . Not on file   Social History Narrative   Review of Systems 13 point ROS reviewed   Objective:   Physical Exam  Constitutional: She is oriented to person, place, and time. She appears well-developed and well-nourished. No distress.  HENT:  Head:  Normocephalic and atraumatic.  Eyes: Conjunctivae and EOM are normal.  Neck: Neck supple.  Cardiovascular: Normal rate.   Pulmonary/Chest: Effort normal.  Abdominal: Bowel sounds are normal. There is no tenderness.  Minimal epigastric discomfort  Musculoskeletal: Normal range of motion. She exhibits edema ( slight, LE, non-pitting).  Neurological: She is alert and oriented to person, place, and time.  Skin: Skin is warm and dry.  Psychiatric: She has a normal mood and affect. Her behavior is normal.  Nursing note and vitals reviewed.  Filed Vitals:   07/04/15 1204  BP: 112/70  Pulse: 96  Temp: 98 F (36.7 C)  TempSrc: Oral  Resp: 18  Height: 5\' 2"  (1.575 m)  Weight: 144 lb (65.318 kg)  SpO2: 98%   Assessment &  Plan:  [name] is a 35 y.o. female Annual physical exam  --anticipatory guidance as below in AVS, screening labs above. Health maintenance items as above in HPI discussed/recommended as applicable.   Gastroesophageal reflux disease with esophagitis - Plan: Ambulatory referral to Gastroenterology Eosinophilic esophagitis - Plan: Ambulatory referral to Gastroenterology  -longstanding issue -refer to GI for second opinion at her request.   Weight gain, abnormal - Plan: TSH, COMPLETE METABOLIC PANEL WITH GFR Screening for thyroid disorder - Plan: TSH  -food choices, portions discussed. Can rtc to discuss further if persists.   Screening for hyperlipidemia - Plan: Lipid panel  Screening, anemia, deficiency, iron - Plan: CBC  Mild intermittent asthma, uncomplicated  - stable. Continue albuterol as needed for now.   No orders of the defined types were placed in this encounter.   Patient Instructions   I will refer you to another gastroenterologist for a second opinion.  If swelling in legs persists, return to discuss further.  Keeping You Healthy  Get These Tests 1. Blood Pressure- Have your blood pressure checked once a year by your health care provider.   Normal blood pressure is 120/80. 2. Weight- Have your body mass index (BMI) calculated to screen for obesity.  BMI is measure of body fat based on height and weight.  You can also calculate your own BMI at https://www.west-esparza.com/. 3. Cholesterol- Have your cholesterol checked every 5 years starting at age 54 then yearly starting at age 50. 4. Chlamydia, HIV, and other sexually transmitted diseases- Get screened every year until age 75, then within three months of each new sexual provider. 5. Pap Test - Every 1-5 years; discuss with your health care provider. 6. Mammogram- Every 1-2 years starting at age 31--50  Take these medicines  Calcium with Vitamin D-Your body needs 1200 mg of Calcium each day and [phone] IU of Vitamin D daily.  Your body can only absorb 500 mg of Calcium at a time so Calcium must be taken in 2 or 3 divided doses throughout the day.  Multivitamin with folic acid- Once daily if it is possible for you to become pregnant.  Get these Immunizations  Gardasil-Series of three doses; prevents HPV related illness such as genital warts and cervical cancer.  Menactra-Single dose; prevents meningitis.  Tetanus shot- Every 10 years.  Flu shot-Every year.  Take these steps 1. Do not smoke-Your healthcare provider can help you quit.  For tips on how to quit go to www.smokefree.gov or call 1-800 QUITNOW. 2. Be physically active- Exercise 5 days a week for at least 30 minutes.  If you are not already physically active, start slow and gradually work up to 30 minutes of moderate physical activity.  Examples of moderate activity include walking briskly, dancing, swimming, bicycling, etc. 3. Breast Cancer- A self breast exam every month is important for early detection of breast cancer.  For more information and instruction on self breast exams, ask your healthcare provider or [place].es. 4. Eat a healthy diet- Eat a variety of healthy foods such as  fruits, vegetables, whole grains, low fat milk, low fat cheeses, yogurt, lean meats, poultry and fish, beans, nuts, tofu, etc.  For more information go to www. Thenutritionsource.org 5. Drink alcohol in moderation- Limit alcohol intake to one drink or less per day. Never drink and drive. 6. Depression- Your emotional health is as important as your physical health.  If you're feeling down or losing interest in things you normally enjoy please talk to your  healthcare provider about being screened for depression. 7. Dental visit- Brush and floss your teeth twice daily; visit your dentist twice a year. 8. Eye doctor- Get an eye exam at least every 2 years. 9. Helmet use- Always wear a helmet when riding a bicycle, motorcycle, rollerblading or skateboarding. 10. Safe sex- If you may be exposed to sexually transmitted infections, use a condom. 11. Seat belts- Seat belts can save your live; always wear one. 12. Smoke/Carbon Monoxide detectors- These detectors need to be installed on the appropriate level of your home. Replace batteries at least once a year. 13. Skin cancer- When out in the sun please cover up and use sunscreen 15 SPF or higher. 14. Violence- If anyone is threatening or hurting you, please tell your healthcare provider.          IF you received an x-ray today, you will receive an invoice from [facility]. Please contact [facility] at [phone] with questions or concerns regarding your invoice.   IF you received labwork today, you will receive an invoice from [employer]. Please contact [facility] at [phone] with questions or concerns regarding your invoice.   Our billing staff will not be able to assist you with questions regarding bills from these companies.  You will be contacted with the lab results as soon as they are available. The fastest way to get your results is to activate your My Chart account. Instructions are located  on the last page of this paperwork. If you have not heard from [place] regarding the results in 2 weeks, please contact this office.        I personally performed the services described in this documentation, which was scribed in my presence. The recorded information has been reviewed and considered, and addended by me as needed.

## 2015-07-05 LAB — COMPLETE METABOLIC PANEL WITH GFR
ALBUMIN: 4.2 g/dL (ref 3.6–5.1)
ALK PHOS: 57 U/L (ref 33–115)
ALT: 11 U/L (ref 6–29)
AST: 13 U/L (ref 10–30)
BILIRUBIN TOTAL: 0.5 mg/dL (ref 0.2–1.2)
BUN: 10 mg/dL (ref 7–25)
CALCIUM: 8.8 mg/dL (ref 8.6–10.2)
CO2: 25 mmol/L (ref 20–31)
Chloride: 105 mmol/L (ref 98–110)
Creat: 0.65 mg/dL (ref 0.50–1.10)
GFR, Est African American: >89 mL/min (ref >=60)
GLUCOSE: 85 mg/dL (ref 65–99)
Potassium: 4.4 mmol/L (ref 3.5–5.3)
SODIUM: 138 mmol/L (ref 135–146)
TOTAL PROTEIN: 6.6 g/dL (ref 6.1–8.1)

## 2015-07-05 LAB — CBC
HCT: 38.4 % (ref 35.0–45.0)
Hemoglobin: 12.7 g/dL (ref 11.7–15.5)
MCH: 29.3 pg (ref 27.0–33.0)
MCHC: 33.1 g/dL (ref 32.0–36.0)
MCV: 88.5 fL (ref 80.0–100.0)
MPV: 9.4 fL (ref 7.5–12.5)
PLATELETS: 340 10*3/uL (ref 140–400)
RBC: 4.34 MIL/uL (ref 3.80–5.10)
RDW: 14.1 % (ref 11.0–15.0)
WBC: 4 10*3/uL (ref 3.8–10.8)

## 2015-07-05 LAB — LIPID PANEL
CHOLESTEROL: 162 mg/dL (ref 125–200)
HDL: 52 mg/dL (ref >=46)
LDL Cholesterol: 86 mg/dL (ref <130)
TRIGLYCERIDES: 118 mg/dL (ref <150)
Total CHOL/HDL Ratio: 3.1 Ratio (ref <=5.0)
VLDL: 24 mg/dL (ref <30)

## 2015-07-05 LAB — TSH: TSH: 1.84 mIU/L

## 2015-07-14 ENCOUNTER — Encounter: Payer: Self-pay | Admitting: *Deleted

## 2015-09-11 ENCOUNTER — Emergency Department (HOSPITAL_COMMUNITY)
Admission: EM | Admit: 2015-09-11 | Discharge: 2015-09-11 | Disposition: A | Discharge status: Home / Self Care | Payer: Medicaid Other | Attending: Emergency Medicine | Admitting: Emergency Medicine

## 2015-09-11 ENCOUNTER — Encounter (HOSPITAL_COMMUNITY): Payer: Self-pay | Admitting: Emergency Medicine

## 2015-09-11 DIAGNOSIS — R51 Headache: Secondary | ICD-10-CM | POA: Insufficient documentation

## 2015-09-11 DIAGNOSIS — Z79899 Other long term (current) drug therapy: Secondary | ICD-10-CM | POA: Insufficient documentation

## 2015-09-11 DIAGNOSIS — J45909 Unspecified asthma, uncomplicated: Secondary | ICD-10-CM | POA: Insufficient documentation

## 2015-09-11 DIAGNOSIS — R519 Headache, unspecified: Secondary | ICD-10-CM

## 2015-09-11 MED ORDER — DEXAMETHASONE SODIUM PHOSPHATE 10 MG/ML IJ SOLN
10.0000 mg | Freq: Once | INTRAMUSCULAR | Status: AC
  Administered 2015-09-11: 10 mg via INTRAVENOUS
  Filled 2015-09-11: qty 1

## 2015-09-11 MED ORDER — BUTALBITAL-APAP-CAFFEINE 50-325-40 MG PO TABS: 1.0000 | ORAL_TABLET | Freq: Four times a day (QID) | ORAL | Status: DC | PRN

## 2015-09-11 MED ORDER — KETOROLAC TROMETHAMINE 30 MG/ML IJ SOLN
30.0000 mg | Freq: Once | INTRAMUSCULAR | Status: AC
  Administered 2015-09-11: 30 mg via INTRAVENOUS
  Filled 2015-09-11: qty 1

## 2015-09-11 MED ORDER — METOCLOPRAMIDE HCL 5 MG/ML IJ SOLN
10.0000 mg | Freq: Once | INTRAMUSCULAR | Status: AC
  Administered 2015-09-11: 10 mg via INTRAVENOUS
  Filled 2015-09-11: qty 2

## 2015-09-11 MED ORDER — DIPHENHYDRAMINE HCL 50 MG/ML IJ SOLN
25.0000 mg | Freq: Once | INTRAMUSCULAR | Status: AC
  Administered 2015-09-11: 25 mg via INTRAVENOUS
  Filled 2015-09-11: qty 1

## 2015-09-11 MED ORDER — SODIUM CHLORIDE 0.9 % IV BOLUS (SEPSIS)
1000.0000 mL | Freq: Once | INTRAVENOUS | Status: AC
  Administered 2015-09-11: 1000 mL via INTRAVENOUS

## 2015-09-11 NOTE — Discharge Instructions (Signed)

## 2015-09-11 NOTE — ED Notes (Signed)
Patient presents for migraine x1 day, history of same, N/V x1 episode, photophobia and right sided numbness. Denies difficulty swallowing, no gait abnomality, A&O x4.

## 2015-09-11 NOTE — ED Provider Notes (Signed)
CSN: [id number]     Arrival date & time 09/11/15  1654 History   First MD Initiated Contact with Patient 09/11/15 2019     Chief Complaint  Patient presents with  . Migraine     (Consider location/radiation/quality/duration/timing/severity/associated sxs/prior Treatment) HPI   35 year old female with history of migraine presenting with complaint of headache. Patient reports gradual onset of right-sided headache which started since yesterday afternoon. The headache is described as a sharp throbbing sensation, persistent, with nausea, light and sound sensitivity and one episode of nonbloody nonbilious vomit. Today she also report having tingling sensation throughout the right side of her body which is new. She attempted to take her headache medication Maxalt without adequate relief. She has had similar headaches in the past without these tingling sensation to the right side of body. She denies fever, diplopia, facial trauma, difficulty swallowing, difficulty walking, URI symptoms, chest pain or shortness of breath. No history of migraine aura.    Past Medical History  Diagnosis Date  . GERD (gastroesophageal reflux disease)   . H/O hiatal hernia   . PONV (postoperative nausea and vomiting)     nausea only  . Migraine   . Asthma     prn inhaler  . History of esophageal stricture     multiple esophageal dilations - states x 12  . Finger mass, right 03/2013    index finger   Past Surgical History  Procedure Laterality Date  . Esophagogastroduodenoscopy  07/07/2011  . Cesarean section  1999; 02/07/2003  . Esophagogastroduodenoscopy  06/16/2011    Procedure: ESOPHAGOGASTRODUODENOSCOPY (EGD);  Surgeon: Willis ModenaWilliam Outlaw, MD;  [facility];  Service: Endoscopy;  Laterality: N/A;  . Balloon dilation  06/16/2011    Procedure: BALLOON DILATION;  Surgeon: Willis ModenaWilliam Outlaw, MD;  Location: [facility];  Service: Endoscopy;  Laterality: N/A;  . Balloon dilation  07/07/2011    Procedure: BALLOON  DILATION;  Surgeon: Willis ModenaWilliam Outlaw, MD;  Location: [facility];  Service: Endoscopy;  Laterality: N/A;  . Esophagogastroduodenoscopy  07/28/2011    Procedure: ESOPHAGOGASTRODUODENOSCOPY (EGD);  Surgeon: Willis ModenaWilliam Outlaw, MD;  [facility];  Service: Endoscopy;  Laterality: N/A;  . Balloon dilation  07/28/2011    Procedure: BALLOON DILATION;  Surgeon: Willis ModenaWilliam Outlaw, MD;  Location: [facility];  Service: Endoscopy;  Laterality: N/A;  . Finger mass excision Left     index finger  . Mass excision Right 04/16/2013    Procedure: RIGHT INDEX EXCISION MASS;  Surgeon: Tami RibasKevin R Kuzma, MD;  Location: [facility];  Service: Orthopedics;  Laterality: Right;  . Tubal ligation    . Esophagogastroduodenoscopy (egd) with propofol N/A 06/26/2014    Procedure: ESOPHAGOGASTRODUODENOSCOPY (EGD) WITH PROPOFOL;  Surgeon: Wandalee FerdinandSam Ganem, MD;  [facility];  Service: Endoscopy;  Laterality: N/A;  . Foreign body removal N/A 06/26/2014    Procedure: FOREIGN BODY REMOVAL;  Surgeon: Wandalee FerdinandSam Ganem, MD;  [facility];  Service: Endoscopy;  Laterality: N/A;   Family History  Problem Relation Age of Onset  . Anesthesia problems Mother     post-op nausea   Social History  Substance Use Topics  . Smoking status: Never Smoker   . Smokeless tobacco: Never Used  . Alcohol Use: No   OB History    No data available     Review of Systems  All other systems reviewed and are negative.     Allergies  Codeine; Sucralfate; and Augmentin  Home Medications   Prior to Admission medications   Medication Sig Start  Date End Date Taking? Authorizing Provider  albuterol (PROVENTIL HFA;VENTOLIN HFA) 108 (90 BASE) MCG/ACT inhaler Inhale into the lungs every 6 (six) hours as needed for wheezing or shortness of breath.   Yes Historical Provider, MD  pantoprazole (PROTONIX) 40 MG tablet Take 40 mg by mouth daily. 08/13/15  Yes Historical Provider, MD  Rizatriptan Benzoate (MAXALT PO) Take by mouth.   Yes  Historical Provider, MD   LMP 09/08/2015 Physical Exam  Constitutional: She is oriented to person, place, and time. She appears well-developed and well-nourished. No distress.  Caucasian female laying in bed in no acute discomfort nontoxic.  HENT:  Head: Atraumatic.  Right Ear: External ear normal.  Left Ear: External ear normal.  Nose: Nose normal.  Mouth/Throat: Oropharynx is clear and moist. No oropharyngeal exudate.  Eyes: Conjunctivae and EOM are normal. Pupils are equal, round, and reactive to light.  Neck: Normal range of motion. Neck supple.  No nuchal rigidity  Cardiovascular: Normal rate and regular rhythm.   Pulmonary/Chest: Effort normal and breath sounds normal.  Abdominal: Soft. Bowel sounds are normal. She exhibits no distension. There is no tenderness.  Neurological: She is alert and oriented to person, place, and time.  Neurologic exam:  Speech clear, pupils equal round reactive to light, extraocular movements intact  Normal peripheral visual fields Cranial nerves III through XII normal including no facial droop Follows commands, moves all extremities x4, normal strength to bilateral upper and lower extremities at all major muscle groups including grip Sensation normal to light touch and pinprick Coordination intact, no limb ataxia, finger-nose-finger normal Rapid alternating movements normal No pronator drift Gait normal   Skin: No rash noted.  Psychiatric: She has a normal mood and affect.  Nursing note and vitals reviewed.   ED Course  Procedures (including critical care time)   MDM   Final diagnoses:  Bad headache    BP 117/63 mmHg  Pulse 62  Temp(Src) 98.3 F (36.8 C)  Resp 17  SpO2 99%  LMP 09/08/2015   8:45 PM Patient with history of migraine here with migraine-like headache. She also report having right sided tingling sensation. She has no subjective weakness all decrease in patellar deep tendon reflex or brachial deep tendon reflex.  Sensation is normal throughout bilateral upper and lower extremities on exam. No focal neuro deficit appreciated. She has no nuchal rigidity concerning for meningitis. I have low suspicion for acute stroke or SAH.  She is well appearing. Will treat symptoms.  11:23 PM Patient reports significant improvement of her headache after receiving migraine cocktail. She is resting comfortably. Does have an low blood pressure 89/57, I will recheck the blood pressure prior to discharge.  11:29 PM Rechecked BP is normal.  Pt stable for discharge.      Fayrene HelperBowie Arthi Mcdonald, PA-C 09/11/15 2330  Benjiman CoreNathan Pickering, MD 09/13/15 [phone]

## 2015-12-06 ENCOUNTER — Encounter (HOSPITAL_COMMUNITY): Payer: Self-pay | Admitting: Emergency Medicine

## 2015-12-06 ENCOUNTER — Emergency Department (HOSPITAL_COMMUNITY)
Admission: EM | Admit: 2015-12-06 | Discharge: 2015-12-06 | Disposition: A | Discharge status: Home / Self Care | Payer: BLUE CROSS/BLUE SHIELD | Attending: Emergency Medicine | Admitting: Emergency Medicine

## 2015-12-06 DIAGNOSIS — J45909 Unspecified asthma, uncomplicated: Secondary | ICD-10-CM | POA: Diagnosis not present

## 2015-12-06 DIAGNOSIS — Y999 Unspecified external cause status: Secondary | ICD-10-CM | POA: Insufficient documentation

## 2015-12-06 DIAGNOSIS — X58XXXA Exposure to other specified factors, initial encounter: Secondary | ICD-10-CM | POA: Diagnosis not present

## 2015-12-06 DIAGNOSIS — Y929 Unspecified place or not applicable: Secondary | ICD-10-CM | POA: Insufficient documentation

## 2015-12-06 DIAGNOSIS — Y939 Activity, unspecified: Secondary | ICD-10-CM | POA: Diagnosis not present

## 2015-12-06 DIAGNOSIS — Z79899 Other long term (current) drug therapy: Secondary | ICD-10-CM | POA: Insufficient documentation

## 2015-12-06 DIAGNOSIS — S0501XA Injury of conjunctiva and corneal abrasion without foreign body, right eye, initial encounter: Secondary | ICD-10-CM | POA: Insufficient documentation

## 2015-12-06 DIAGNOSIS — S0591XA Unspecified injury of right eye and orbit, initial encounter: Secondary | ICD-10-CM | POA: Diagnosis present

## 2015-12-06 MED ORDER — TETRACAINE HCL 0.5 % OP SOLN
2.0000 [drp] | Freq: Once | OPHTHALMIC | Status: AC
  Administered 2015-12-06: 2 [drp] via OPHTHALMIC
  Filled 2015-12-06: qty 4

## 2015-12-06 MED ORDER — FLUORESCEIN SODIUM 1 MG OP STRP
1.0000 | ORAL_STRIP | Freq: Once | OPHTHALMIC | Status: AC
  Administered 2015-12-06: 1 via OPHTHALMIC
  Filled 2015-12-06: qty 1

## 2015-12-06 MED ORDER — ERYTHROMYCIN 5 MG/GM OP OINT: TOPICAL_OINTMENT | OPHTHALMIC | 0 refills | Status: DC

## 2015-12-06 NOTE — ED Provider Notes (Signed)
[facility]-EMERGENCY DEPT Provider Note   CSN: [id number] Arrival date & time: 12/06/15  1511  By signing my name below, I, Nelwyn Salisbury, attest that this documentation has been prepared under the direction and in the presence of non-physician practitioner, Elizabeth Sauer, PA-C. Electronically Signed: Nelwyn Salisbury, Scribe. 12/06/2015. 4:19 PM.  History   Chief Complaint Chief Complaint  Patient presents with  . Eye Pain   The history is provided by the patient and the spouse. No language interpreter was used.    HPI Comments:  [name] is a 35 y.o. female with PMHx of GERD who presents to the Emergency Department complaining of sudden-onset unchanged right eye pain beginning when the pt woke up this morning. She notes that the pain feels like the sensation of a foreign body in her eye. No modifying factors indicated. Pt endorses intermittent blurry vision earlier this morning. She flushed her eye with water and visine. Foreign body sensation persists, however visual changes have improved. She has not tried anything to relieve her symptoms.  Past Medical History:  Diagnosis Date  . Asthma    prn inhaler  . Finger mass, right 03/2013   index finger  . GERD (gastroesophageal reflux disease)   . H/O hiatal hernia   . History of esophageal stricture    multiple esophageal dilations - states x 12  . Migraine   . PONV (postoperative nausea and vomiting)    nausea only    Patient Active Problem List   Diagnosis Date Noted  . Anxiety and depression 11/30/2011  . 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 EE (eosinophilic esophagitis) 07/26/2011  . ASTHMA 11/14/2008  . GERD 11/14/2008  . DYSPHAGIA UNSPECIFIED 11/14/2008    Past Surgical History:  Procedure Laterality Date  . BALLOON DILATION  06/16/2011   Procedure: BALLOON DILATION;  Surgeon: Willis Modena, MD;  Location: [facility];  Service: Endoscopy;  Laterality: N/A;  . BALLOON DILATION  07/07/2011   Procedure: BALLOON DILATION;   Surgeon: Willis Modena, MD;  Location: [facility];  Service: Endoscopy;  Laterality: N/A;  . BALLOON DILATION  07/28/2011   Procedure: BALLOON DILATION;  Surgeon: Willis Modena, MD;  Location: [facility];  Service: Endoscopy;  Laterality: N/A;  . CESAREAN SECTION  1999; 02/07/2003  . ESOPHAGOGASTRODUODENOSCOPY  07/07/2011  . ESOPHAGOGASTRODUODENOSCOPY  06/16/2011   Procedure: ESOPHAGOGASTRODUODENOSCOPY (EGD);  Surgeon: Willis Modena, MD;  [facility];  Service: Endoscopy;  Laterality: N/A;  . ESOPHAGOGASTRODUODENOSCOPY  07/28/2011   Procedure: ESOPHAGOGASTRODUODENOSCOPY (EGD);  Surgeon: Willis Modena, MD;  [facility];  Service: Endoscopy;  Laterality: N/A;  . ESOPHAGOGASTRODUODENOSCOPY (EGD) WITH PROPOFOL N/A 06/26/2014   Procedure: ESOPHAGOGASTRODUODENOSCOPY (EGD) WITH PROPOFOL;  Surgeon: Wandalee Ferdinand, MD;  Location: [facility];  Service: Endoscopy;  Laterality: N/A;  . FINGER MASS EXCISION Left    index finger  . FOREIGN BODY REMOVAL N/A 06/26/2014   Procedure: FOREIGN BODY REMOVAL;  Surgeon: Wandalee Ferdinand, MD;  Location: [facility];  Service: Endoscopy;  Laterality: N/A;  . MASS EXCISION Right 04/16/2013   Procedure: RIGHT INDEX EXCISION MASS;  Surgeon: Tami Ribas, MD;  Location: [facility];  Service: Orthopedics;  Laterality: Right;  . TUBAL LIGATION      OB History    No data available       Home Medications    Prior to Admission medications   Medication Sig Start Date End Date Taking? Authorizing Provider  albuterol (PROVENTIL HFA;VENTOLIN HFA) 108 (90 BASE) MCG/ACT inhaler Inhale into the lungs every 6 (  six) hours as needed for wheezing or shortness of breath.    Historical Provider, MD  butalbital-acetaminophen-caffeine (FIORICET) [phone] MG tablet Take 1-2 tablets by mouth every 6 (six) hours as needed for headache. 09/11/15 09/10/16  Fayrene Helper, PA-C  erythromycin ophthalmic ointment Place a 1/2 inch ribbon of ointment into the lower eyelid  four times daily. 12/06/15   Camile Esters Pilcher Deborh Pense, PA-C  pantoprazole (PROTONIX) 40 MG tablet Take 40 mg by mouth daily. 08/13/15   Historical Provider, MD  Rizatriptan Benzoate (MAXALT PO) Take by mouth.    Historical Provider, MD    Family History Family History  Problem Relation Age of Onset  . Anesthesia problems Mother     post-op nausea    Social History Social History  Substance Use Topics  . Smoking status: Never Smoker  . Smokeless tobacco: Never Used  . Alcohol use No     Allergies   Codeine; Sucralfate; and Augmentin [amoxicillin-pot clavulanate]   Review of Systems Review of Systems  Eyes: Positive for pain, redness and visual disturbance.    Physical Exam Updated Vital Signs BP 125/84 (BP Location: Left Arm)   Pulse 67   Temp 98.1 F (36.7 C) (Oral)   Resp 14   LMP 11/22/2015   SpO2 98%   Physical Exam  Constitutional: She is oriented to person, place, and time. She appears well-developed and well-nourished. No distress.  HENT:  Head: Normocephalic and atraumatic.  Eyes: Conjunctivae and EOM are normal. Pupils are equal, round, and reactive to light. Lids are everted and swept, no foreign bodies found.  Slit lamp exam:      The right eye shows corneal abrasion and fluorescein uptake.  Right eye tearing.   Neck: Normal range of motion. Neck supple.  Cardiovascular: Normal rate, regular rhythm and normal heart sounds.   Pulmonary/Chest: Effort normal and breath sounds normal. No respiratory distress.  Neurological: She is alert and oriented to person, place, and time.  Skin: Skin is warm and dry.  Nursing note and vitals reviewed.    ED Treatments / Results  DIAGNOSTIC STUDIES:  Oxygen Saturation is 100% on RA, normal by my interpretation.    COORDINATION OF CARE:  4:20 PM Discussed treatment plan with pt at bedside and pt agreed to plan.  Labs (all labs ordered are listed, but only abnormal results are displayed) Labs Reviewed - No data to  display  EKG  EKG Interpretation None       Radiology No results found.  Procedures Procedures (including critical care time)  Medications Ordered in ED Medications  tetracaine (PONTOCAINE) 0.5 % ophthalmic solution 2 drop (2 drops Right Eye Given 12/06/15 1706)  fluorescein ophthalmic strip 1 strip (1 strip Right Eye Given 12/06/15 1706)     Initial Impression / Assessment and Plan / ED Course  I have reviewed the triage vital signs and the nursing notes.  Pertinent labs & imaging results that were available during my care of the patient were reviewed by me and considered in my medical decision making (see chart for details).  Clinical Course   Pt with corneal abrasion on exam. Eye was thoroughly flushed in ED. Eyelid flipped and area thoroughly examined with no evidence of foreign body.  Exam not concerning for orbital cellulitis, hyphema. No concern for uveitis. Patient will be discharged home with erythromycin ointment. Patient understands to follow up with ophthalmology on Monday and that she will need to call them first thing Monday morning. Return precautions discussed. Patient appears  safe for discharged.    Final Clinical Impressions(s) / ED Diagnoses   Final diagnoses:  Corneal abrasion, right, initial encounter    New Prescriptions New Prescriptions   ERYTHROMYCIN OPHTHALMIC OINTMENT    Place a 1/2 inch ribbon of ointment into the lower eyelid four times daily.   I personally performed the services described in this documentation, which was scribed in my presence. The recorded information has been reviewed and is accurate.     Chase PicketJaime Pilcher Merleen Picazo, PA-C 12/06/15 1740    Melene Planan Floyd, DO 12/06/15 2130

## 2015-12-06 NOTE — ED Triage Notes (Signed)
Per pt, states she might have something in eye-irritated, swollen-woke up this am complaining of right eye discomfort

## 2015-12-06 NOTE — Discharge Instructions (Signed)
Please follow [facility] with [facility] (Dr. Sherryll BurgerShah is our eye doctor on call today) on Monday.  [address] [place], [place] [address] Local: [phone] Toll-free: [phone]) [phone]  Apply topical antibiotic ointment as directed.  Return to ER for new or worsening symptoms, any additional concerns.

## 2016-06-02 ENCOUNTER — Emergency Department (HOSPITAL_COMMUNITY)
Admission: EM | Admit: 2016-06-02 | Discharge: 2016-06-02 | Disposition: A | Discharge status: Home / Self Care | Payer: BLUE CROSS/BLUE SHIELD | Attending: Emergency Medicine | Admitting: Emergency Medicine

## 2016-06-02 ENCOUNTER — Emergency Department (HOSPITAL_COMMUNITY): Payer: BLUE CROSS/BLUE SHIELD

## 2016-06-02 ENCOUNTER — Encounter (HOSPITAL_COMMUNITY): Payer: Self-pay | Admitting: Emergency Medicine

## 2016-06-02 DIAGNOSIS — R05 Cough: Secondary | ICD-10-CM | POA: Insufficient documentation

## 2016-06-02 DIAGNOSIS — M791 Myalgia: Secondary | ICD-10-CM | POA: Insufficient documentation

## 2016-06-02 DIAGNOSIS — J45909 Unspecified asthma, uncomplicated: Secondary | ICD-10-CM | POA: Diagnosis not present

## 2016-06-02 DIAGNOSIS — R6889 Other general symptoms and signs: Secondary | ICD-10-CM

## 2016-06-02 DIAGNOSIS — R112 Nausea with vomiting, unspecified: Secondary | ICD-10-CM | POA: Diagnosis not present

## 2016-06-02 DIAGNOSIS — R5383 Other fatigue: Secondary | ICD-10-CM | POA: Insufficient documentation

## 2016-06-02 MED ORDER — ONDANSETRON 4 MG PO TBDP: 4.0000 mg | ORAL_TABLET | Freq: Three times a day (TID) | ORAL | 0 refills | Status: DC | PRN

## 2016-06-02 MED ORDER — NAPROXEN 500 MG PO TABS: 500.0000 mg | ORAL_TABLET | Freq: Two times a day (BID) | ORAL | 0 refills | Status: DC

## 2016-06-02 MED ORDER — IBUPROFEN 800 MG PO TABS
800.0000 mg | ORAL_TABLET | Freq: Once | ORAL | Status: AC
  Administered 2016-06-02: 800 mg via ORAL
  Filled 2016-06-02: qty 1

## 2016-06-02 MED ORDER — ONDANSETRON 4 MG PO TBDP
4.0000 mg | ORAL_TABLET | Freq: Once | ORAL | Status: AC
  Administered 2016-06-02: 4 mg via ORAL
  Filled 2016-06-02: qty 1

## 2016-06-02 MED ORDER — BENZONATATE 100 MG PO CAPS: 100.0000 mg | ORAL_CAPSULE | Freq: Three times a day (TID) | ORAL | 0 refills | Status: DC

## 2016-06-02 NOTE — Discharge Instructions (Signed)
You were diagnosed with the flu (influenza) or other flu-like symptoms.  You will feel ill for as much as a few weeks.  Please take any prescribed medications as instructed, and you may use over-the-counter Tylenol and/or ibuprofen as needed according to label instructions (unless you have an allergy to either or have been told by your doctor not to take them).  Follow up with your physician as instructed above, and return to the Emergency Department (ED) if you are unable to tolerate fluids due to vomiting, have worsening trouble breathing, become extremely tired or difficult to awaken, or if you develop any other symptoms that concern you.

## 2016-06-02 NOTE — ED Triage Notes (Signed)
Patient reports productive cough starting Saturday. Dx with URI. Patient was started on amoxicillin. Patient reports 2 episodes of emesis starting today.

## 2016-06-02 NOTE — ED Provider Notes (Signed)
Emergency Department Provider Note   I have reviewed the triage vital signs and the nursing notes.   HISTORY  Chief Complaint Flu Like Symptoms   HPI Milani Lowenstein Lung is a 36 y.o. female with PMH of asthma, GERD, and migraine HA presents to the emergency department for evaluation of flulike symptoms for the past 4 days. Patient went to her primary care physician on Monday and was started on amoxicillin for a "respiratory infection." She reports continued coughing, fatigue, muscle aches and new-onset nausea and vomiting this morning. She has mild sore throat and headache. She states her primary care physician tested her for strep throat and was negative. In using over-the-counter medications with little to no relief. She has been taking amoxicillin for the past 2 days. No radiation of symptoms. Symptoms are moderate, constant, with no modifying factors.   Past Medical History:  Diagnosis Date  . Asthma    prn inhaler  . Finger mass, right 03/2013   index finger  . GERD (gastroesophageal reflux disease)   . H/O hiatal hernia   . History of esophageal stricture    multiple esophageal dilations - states x 12  . Migraine   . PONV (postoperative nausea and vomiting)    nausea only    Patient Active Problem List   Diagnosis Date Noted  . Anxiety and depression 11/30/2011  . 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 EE (eosinophilic esophagitis) 07/26/2011  . ASTHMA 11/14/2008  . GERD 11/14/2008  . DYSPHAGIA UNSPECIFIED 11/14/2008    Past Surgical History:  Procedure Laterality Date  . BALLOON DILATION  06/16/2011   Procedure: BALLOON DILATION;  Surgeon: Willis Modena, MD;  Location: [facility];  Service: Endoscopy;  Laterality: N/A;  . BALLOON DILATION  07/07/2011   Procedure: BALLOON DILATION;  Surgeon: Willis Modena, MD;  Location: [facility];  Service: Endoscopy;  Laterality: N/A;  . BALLOON DILATION  07/28/2011   Procedure: BALLOON DILATION;  Surgeon: Willis Modena, MD;   Location: [facility];  Service: Endoscopy;  Laterality: N/A;  . CESAREAN SECTION  1999; 02/07/2003  . ESOPHAGOGASTRODUODENOSCOPY  07/07/2011  . ESOPHAGOGASTRODUODENOSCOPY  06/16/2011   Procedure: ESOPHAGOGASTRODUODENOSCOPY (EGD);  Surgeon: Willis Modena, MD;  Location: [facility];  Service: Endoscopy;  Laterality: N/A;  . ESOPHAGOGASTRODUODENOSCOPY  07/28/2011   Procedure: ESOPHAGOGASTRODUODENOSCOPY (EGD);  Surgeon: Willis Modena, MD;  Location: [facility];  Service: Endoscopy;  Laterality: N/A;  . ESOPHAGOGASTRODUODENOSCOPY (EGD) WITH PROPOFOL N/A 06/26/2014   Procedure: ESOPHAGOGASTRODUODENOSCOPY (EGD) WITH PROPOFOL;  Surgeon: Wandalee Ferdinand, MD;  Location: [facility] ENDOSCOPY;  Service: Endoscopy;  Laterality: N/A;  . FINGER MASS EXCISION Left    index finger  . FOREIGN BODY REMOVAL N/A 06/26/2014   Procedure: FOREIGN BODY REMOVAL;  Surgeon: Wandalee Ferdinand, MD;  Location: [facility];  Service: Endoscopy;  Laterality: N/A;  . MASS EXCISION Right 04/16/2013   Procedure: RIGHT INDEX EXCISION MASS;  Surgeon: Tami Ribas, MD;  Location: [facility];  Service: Orthopedics;  Laterality: Right;  . TUBAL LIGATION      Current Outpatient Rx  . Order #: [id number] Class: Historical Med  . Order #: [id number] Class: Historical Med  . Order #: [id number] Class: Historical Med  . Order #: [id number] Class: Historical Med  . Order #: [id number] Class: Historical Med  . Order #: [id number] Class: Print  . Order #: [id number] Class: Print  . Order #: [id number] Class: Print    Allergies Codeine; Sucralfate; and Augmentin [amoxicillin-pot clavulanate]  Family History  Problem Relation Age of Onset  . Anesthesia problems Mother  post-op nausea    Social History Social History  Substance Use Topics  . Smoking status: Never Smoker  . Smokeless tobacco: Never Used  . Alcohol use No    Review of Systems  Constitutional: No fever/chills. Positive body aches.  Eyes: No visual changes. ENT: No sore  throat. Cardiovascular: Denies chest pain. Respiratory: Positive shortness of breath and cough.  Gastrointestinal: No abdominal pain. Positive nausea and vomiting.  No diarrhea.  No constipation. Genitourinary: Negative for dysuia. Musculoskeletal: Negative for back pain. Skin: Negative for rash. Neurological: Negative for headaches, focal weakness or numbness.  10-point ROS otherwise negative.  ____________________________________________   PHYSICAL EXAM:  VITAL SIGNS: ED Triage Vitals  Enc Vitals Group     BP 06/02/16 0829 129/86     Pulse Rate 06/02/16 0829 79     Resp 06/02/16 0829 18     Temp 06/02/16 0829 97.6 F (36.4 C)     Temp Source 06/02/16 0829 Oral     SpO2 06/02/16 0829 100 %     Weight 06/02/16 0830 150 lb (68 kg)     Height 06/02/16 0830 5\' 1"  (1.549 m)     Pain Score 06/02/16 0830 2   Constitutional: Alert and oriented. Well appearing and in no acute distress. Eyes: Conjunctivae are normal.  Head: Atraumatic. Nose: Mild congestion/rhinnorhea. Mouth/Throat: Mucous membranes are moist.  Oropharynx with mild erythema. No exudate, tonsillar hypertrophy, or PTA.  Neck: No stridor.   Cardiovascular: Normal rate, regular rhythm. Good peripheral circulation. Grossly normal heart sounds.   Respiratory: Normal respiratory effort.  No retractions. Lungs CTAB. Gastrointestinal: Soft and nontender. No distention.  Musculoskeletal: No lower extremity tenderness nor edema. No gross deformities of extremities. Neurologic:  Normal speech and language. No gross focal neurologic deficits are appreciated.  Skin:  Skin is warm, dry and intact. No rash noted. Psychiatric: Mood and affect are normal. Speech and behavior are normal.  ____________________________________________  RADIOLOGY  Dg Chest 2 View  Result Date: 06/02/2016 CLINICAL DATA:  Shortness of breath with cough and congestion. Chest pain. EXAM: CHEST  2 VIEW COMPARISON:  None. FINDINGS: Lungs are clear.  Heart size and pulmonary vascularity are normal. No adenopathy. No pneumothorax. No bone lesions. IMPRESSION: No edema or consolidation. Electronically Signed   By: Bretta Bang III M.D.   On: 06/02/2016 09:29    ____________________________________________   PROCEDURES  Procedure(s) performed:   Procedures  None ____________________________________________   INITIAL IMPRESSION / ASSESSMENT AND PLAN / ED COURSE  Pertinent labs & imaging results that were available during my care of the patient were reviewed by me and considered in my medical decision making (see chart for details).  Patient presents emergency department for evaluation of flulike symptoms for the past 4 days. She has new-onset nausea and vomiting this morning. No abdominal pain. Exam the abdomen is unremarkable. Patient has normal oxygen saturation and otherwise normal vital signs. Does have some voice hoarseness consistent with viral process. Discussed the expected length of illness and supportive care measures at home. Unclear why she is started on amoxicillin with no identifiable pneumonia. Plan to obtain a chest x-ray with continued/worsening symptoms to see if change in antibiotic therapy is indicated but clinically this seems viral in origin. Also give Zofran with PO challenge.   09:50 PM Patient feeling improved and tolerating liquids in the emergency department. No known on chest x-ray. Advised that she discuss with her primary care physician regarding completing her antibiotic course. I strongly suspect a viral  etiology. Patient is not a candidate for Tamiflu. Provided Zofran, naproxen, Tessalon Perles at discharge for symptom management. Encouraged hydration and discussed return precautions in detail.  At this time, I do not feel there is any life-threatening condition present. I have reviewed and discussed all results (EKG, imaging, lab, urine as appropriate), exam findings with patient. I have reviewed  nursing notes and appropriate previous records.  I feel the patient is safe to be discharged home without further emergent workup. Discussed usual and customary return precautions. Patient and family (if present) verbalize understanding and are comfortable with this plan.  Patient will follow-up with their primary care provider. If they do not have a primary care provider, information for follow-up has been provided to them. All questions have been answered.  ____________________________________________  FINAL CLINICAL IMPRESSION(S) / ED DIAGNOSES  Final diagnoses:  Flu-like symptoms  Non-intractable vomiting with nausea, unspecified vomiting type     MEDICATIONS GIVEN DURING THIS VISIT:  Medications  ibuprofen (ADVIL,MOTRIN) tablet 800 mg (800 mg Oral Given 06/02/16 0924)  ondansetron (ZOFRAN-ODT) disintegrating tablet 4 mg (4 mg Oral Given 06/02/16 0924)     NEW OUTPATIENT MEDICATIONS STARTED DURING THIS VISIT:  New Prescriptions   BENZONATATE (TESSALON) 100 MG CAPSULE    Take 1 capsule (100 mg total) by mouth every 8 (eight) hours.   NAPROXEN (NAPROSYN) 500 MG TABLET    Take 1 tablet (500 mg total) by mouth 2 (two) times daily.   ONDANSETRON (ZOFRAN ODT) 4 MG DISINTEGRATING TABLET    Take 1 tablet (4 mg total) by mouth every 8 (eight) hours as needed for nausea or vomiting.     Note:  This document was prepared using Dragon voice recognition software and may include unintentional dictation errors.  Alona BeneJoshua Willodean Leven, MD Emergency Medicine   Maia PlanJoshua G Keiffer Piper, MD 06/02/16 [phone]

## 2016-06-11 ENCOUNTER — Other Ambulatory Visit (HOSPITAL_COMMUNITY): Payer: Self-pay | Admitting: Internal Medicine

## 2016-06-11 DIAGNOSIS — K209 Esophagitis, unspecified without bleeding: Secondary | ICD-10-CM

## 2016-06-15 ENCOUNTER — Ambulatory Visit (HOSPITAL_COMMUNITY)
Admission: RE | Admit: 2016-06-15 | Discharge: 2016-06-15 | Disposition: A | Discharge status: Home / Self Care | Payer: BLUE CROSS/BLUE SHIELD | Source: Ambulatory Visit | Attending: Internal Medicine | Admitting: Internal Medicine

## 2016-06-15 DIAGNOSIS — K224 Dyskinesia of esophagus: Secondary | ICD-10-CM | POA: Diagnosis not present

## 2016-06-15 DIAGNOSIS — K209 Esophagitis, unspecified without bleeding: Secondary | ICD-10-CM

## 2018-04-14 DIAGNOSIS — Z029 Encounter for administrative examinations, unspecified: Secondary | ICD-10-CM

## 2018-05-17 DIAGNOSIS — Z800 Family history of malignant neoplasm of digestive organs: Secondary | ICD-10-CM | POA: Insufficient documentation

## 2018-05-25 ENCOUNTER — Other Ambulatory Visit: Payer: Self-pay | Admitting: Obstetrics and Gynecology

## 2018-06-01 ENCOUNTER — Ambulatory Visit: Payer: Medicaid Other | Admitting: Advanced Practice Midwife

## 2018-06-01 ENCOUNTER — Encounter: Payer: Self-pay | Admitting: Advanced Practice Midwife

## 2018-06-01 ENCOUNTER — Other Ambulatory Visit (HOSPITAL_COMMUNITY)
Admission: RE | Admit: 2018-06-01 | Discharge: 2018-06-01 | Disposition: A | Discharge status: Home / Self Care | Payer: Medicaid Other | Source: Ambulatory Visit | Attending: Obstetrics and Gynecology | Admitting: Obstetrics and Gynecology

## 2018-06-01 ENCOUNTER — Other Ambulatory Visit: Payer: Self-pay

## 2018-06-01 VITALS — BP 110/80 | HR 95 | Ht 61.0 in | Wt 136.3 lb

## 2018-06-01 DIAGNOSIS — I781 Nevus, non-neoplastic: Secondary | ICD-10-CM | POA: Insufficient documentation

## 2018-06-01 DIAGNOSIS — Z01419 Encounter for gynecological examination (general) (routine) without abnormal findings: Secondary | ICD-10-CM

## 2018-06-01 NOTE — Patient Instructions (Signed)
telangiectasia

## 2018-06-01 NOTE — Progress Notes (Signed)
[name] 38 y.o.  Vitals:   06/01/18 1416  BP: 110/80  Pulse: 95     Filed Weights   06/01/18 1416  Weight: 136 lb 4.8 oz (61.8 kg)    Past Medical History: Past Medical History:  Diagnosis Date  . Asthma    prn inhaler  . Finger mass, right 03/2013   index finger  . GERD (gastroesophageal reflux disease)   . H/O hiatal hernia   . History of esophageal stricture    multiple esophageal dilations - states x 12  . Migraine   . PONV (postoperative nausea and vomiting)    nausea only    Past Surgical History: Past Surgical History:  Procedure Laterality Date  . BALLOON DILATION  06/16/2011   Procedure: BALLOON DILATION;  Surgeon: Willis Modena, MD;  Location: [facility];  Service: Endoscopy;  Laterality: N/A;  . BALLOON DILATION  07/07/2011   Procedure: BALLOON DILATION;  Surgeon: Willis Modena, MD;  Location: [facility];  Service: Endoscopy;  Laterality: N/A;  . BALLOON DILATION  07/28/2011   Procedure: BALLOON DILATION;  Surgeon: Willis Modena, MD;  Location: [facility];  Service: Endoscopy;  Laterality: N/A;  . CESAREAN SECTION  1999; 02/07/2003  . ESOPHAGOGASTRODUODENOSCOPY  07/07/2011  . ESOPHAGOGASTRODUODENOSCOPY  06/16/2011   Procedure: ESOPHAGOGASTRODUODENOSCOPY (EGD);  Surgeon: Willis Modena, MD;  Location: [facility];  Service: Endoscopy;  Laterality: N/A;  . ESOPHAGOGASTRODUODENOSCOPY  07/28/2011   Procedure: ESOPHAGOGASTRODUODENOSCOPY (EGD);  Surgeon: Willis Modena, MD;  Location: [facility];  Service: Endoscopy;  Laterality: N/A;  . ESOPHAGOGASTRODUODENOSCOPY (EGD) WITH PROPOFOL N/A 06/26/2014   Procedure: ESOPHAGOGASTRODUODENOSCOPY (EGD) WITH PROPOFOL;  Surgeon: Wandalee Ferdinand, MD;  Location: [facility];  Service: Endoscopy;  Laterality: N/A;  . FINGER MASS EXCISION Left    index finger  . FOREIGN BODY REMOVAL N/A 06/26/2014   Procedure: FOREIGN BODY REMOVAL;  Surgeon: Wandalee Ferdinand, MD;  Location: [facility] ENDOSCOPY;  Service: Endoscopy;  Laterality: N/A;  .  MASS EXCISION Right 04/16/2013   Procedure: RIGHT INDEX EXCISION MASS;  Surgeon: Tami Ribas, MD;  Location: [facility];  Service: Orthopedics;  Laterality: Right;  . TUBAL LIGATION      Family History: Family History  Problem Relation Age of Onset  . Anesthesia problems Mother        post-op nausea    Social History: Social History   Tobacco Use  . Smoking status: Never Smoker  . Smokeless tobacco: Never Used  Substance Use Topics  . Alcohol use: No  . Drug use: No    Allergies:  Allergies  Allergen Reactions  . Codeine Nausea And Vomiting  . Sucralfate Nausea And Vomiting  . Augmentin [Amoxicillin-Pot Clavulanate]     Pt states she recently took amoxicillin without incident, but cannot take the combination in Augmentin.   Has patient had a PCN reaction causing immediate rash, facial/tongue/throat swelling, SOB or lightheadedness with hypotension: No Has patient had a PCN reaction causing severe rash involving mucus membranes or skin necrosis: No Has patient had a PCN reaction that required hospitalization No Has patient had a PCN reaction occurring within the last 10 years: No If all of the above answers are "NO      Current Outpatient Medications:  .  acetaminophen (TYLENOL) 500 MG tablet, Take 1,000 mg by mouth every 6 (six) hours as needed for mild pain, moderate pain, fever or headache., Disp: , Rfl:  .  albuterol (PROVENTIL HFA;VENTOLIN HFA) 108 (90 BASE) MCG/ACT inhaler, Inhale 2 puffs into the  lungs every 4 (four) hours as needed for wheezing or shortness of breath. , Disp: , Rfl:  .  amoxicillin (AMOXIL) 400 MG/5ML suspension, Take 800 mg by mouth 2 (two) times daily. Started 05/29/2016 for 10 days, Disp: , Rfl:  .  benzonatate (TESSALON) 100 MG capsule, Take 1 capsule (100 mg total) by mouth every 8 (eight) hours. (Patient not taking: Reported on 06/01/2018), Disp: 21 capsule, Rfl: 0 .  naproxen (NAPROSYN) 500 MG tablet, Take 1 tablet (500 mg  total) by mouth 2 (two) times daily. (Patient not taking: Reported on 06/01/2018), Disp: 30 tablet, Rfl: 0 .  omeprazole (PRILOSEC) 40 MG capsule, Take 40 mg by mouth daily., Disp: , Rfl:  .  ondansetron (ZOFRAN ODT) 4 MG disintegrating tablet, Take 1 tablet (4 mg total) by mouth every 8 (eight) hours as needed for nausea or vomiting. (Patient not taking: Reported on 06/01/2018), Disp: 20 tablet, Rfl: 0 .  pantoprazole (PROTONIX) 40 MG tablet, Take 40 mg by mouth 2 (two) times daily. , Disp: , Rfl: 11  History of Present Illness: here for pap/physical.  Last pap 2012, normal.  Has had 2 csections, wanting to surrogate. Discussed how inceased # of CS can ^ risk for chronic pelvic pain. OK. With it. Strongly recommended against tanning beds d/t 70% ^ risk of melanoma over sun.  Says PCP checked labs.  + Review of Systems   Patient denies any headaches, blurred vision, shortness of breath, chest pain, abdominal pain, problems with bowel movements, urination, or intercourse.   Physical Exam: General:  Well developed, well nourished, no acute distress.   Skin:  Warm and dry Neck:  Midline trachea, normal thyroid Lungs; Clear to auscultation bilaterally Breast:  No dominant palpable mass, retraction, or nipple discharge Cardiovascular: Regular rate and rhythm Abdomen:  Soft, non tender, no hepatosplenomegaly Pelvic:  External genitalia is normal in appearance. Telangiectasia on creases.  The vagina is normal in appearance.  The cervix is bulbous.  Uterus is felt to be normal size, shape, and contour.  No adnexal masses or tenderness noted.  Extremities:  No swelling or varicosities noted Psych:  No mood changes.     Impression: normal GYN exam     Plan: if pap normal, repeat in 3 years

## 2018-06-06 LAB — CYTOLOGY - PAP
Adequacy: ABSENT — AB
Chlamydia: NEGATIVE
HPV: NOT DETECTED
Neisseria Gonorrhea: NEGATIVE

## 2018-06-07 ENCOUNTER — Encounter: Payer: Self-pay | Admitting: Advanced Practice Midwife

## 2018-07-05 IMAGING — CR DG CHEST 2V
2 series · 2 of 2 positions shown · non-contrast
Comparison: None.

CLINICAL DATA: Shortness of breath with cough and congestion. Chest
pain.

EXAM:
CHEST  2 VIEW

[w chest pa]
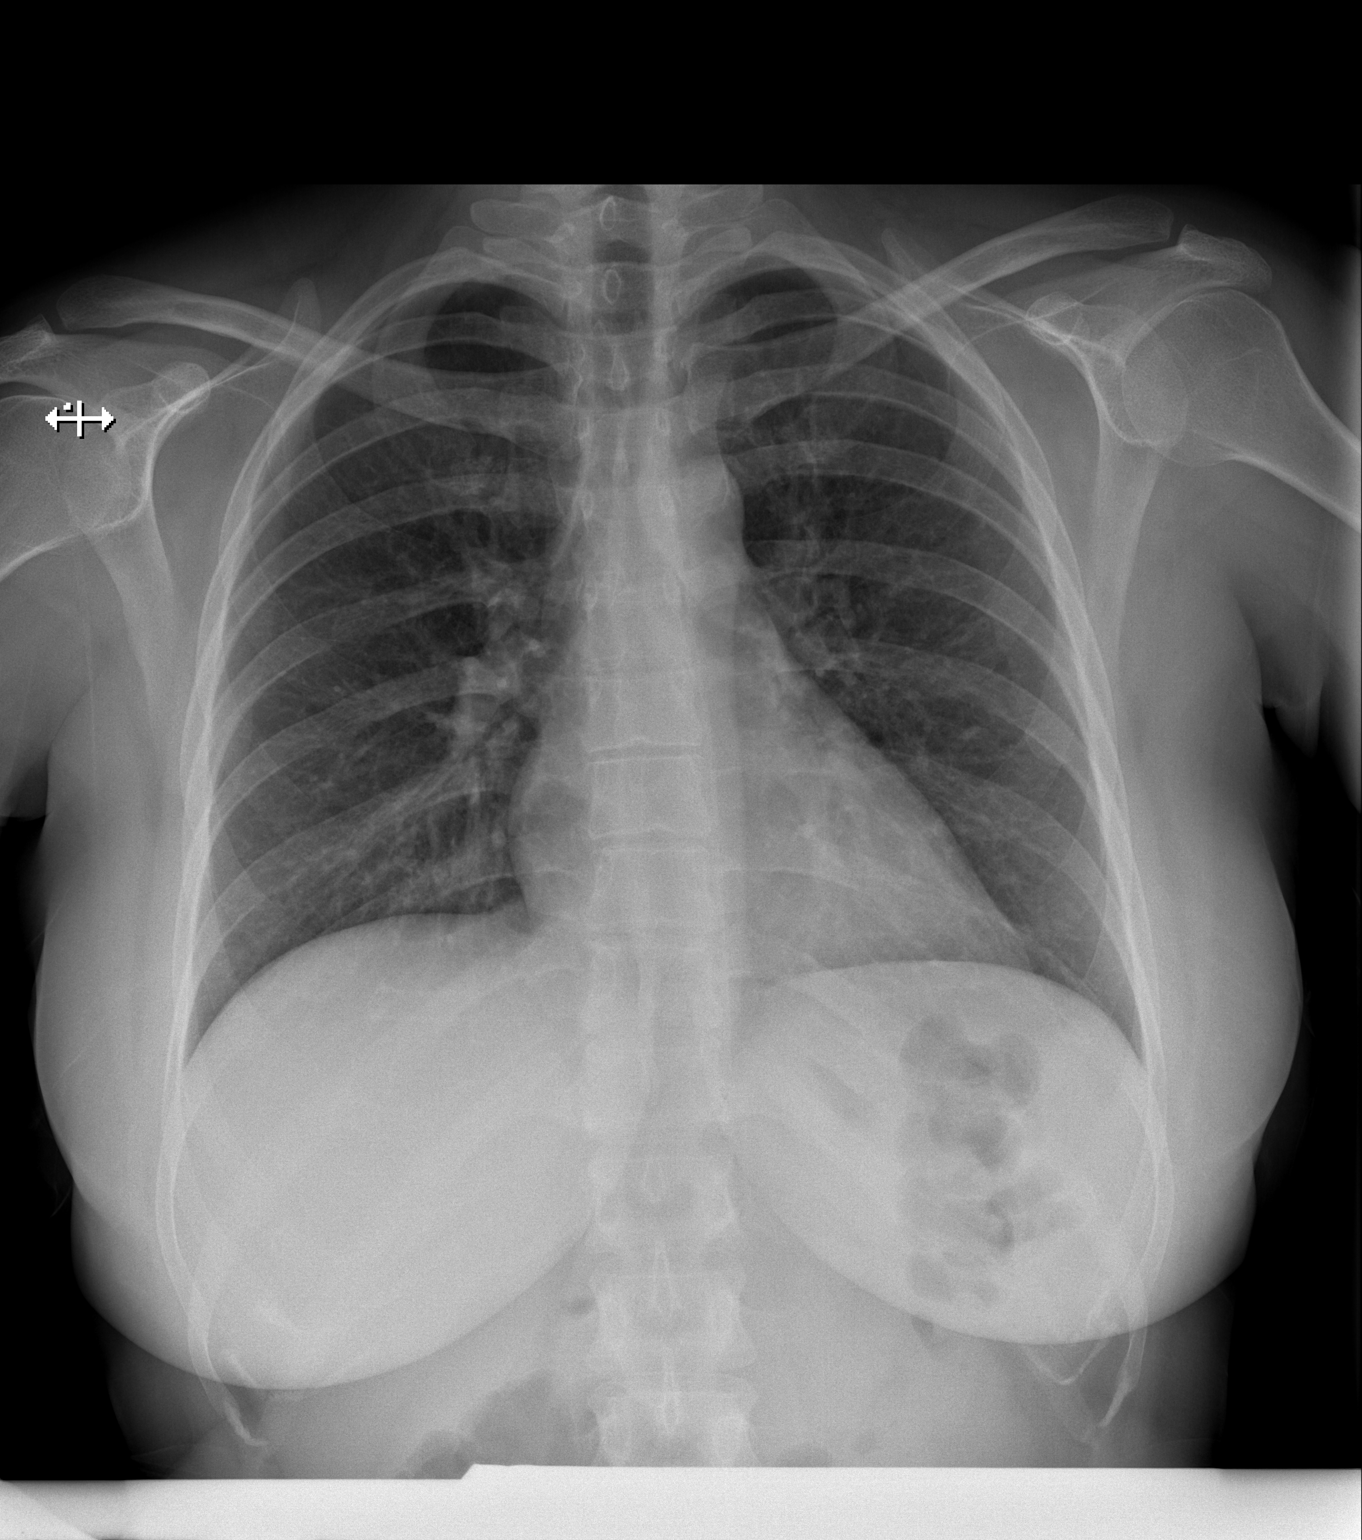

[w chest lat]
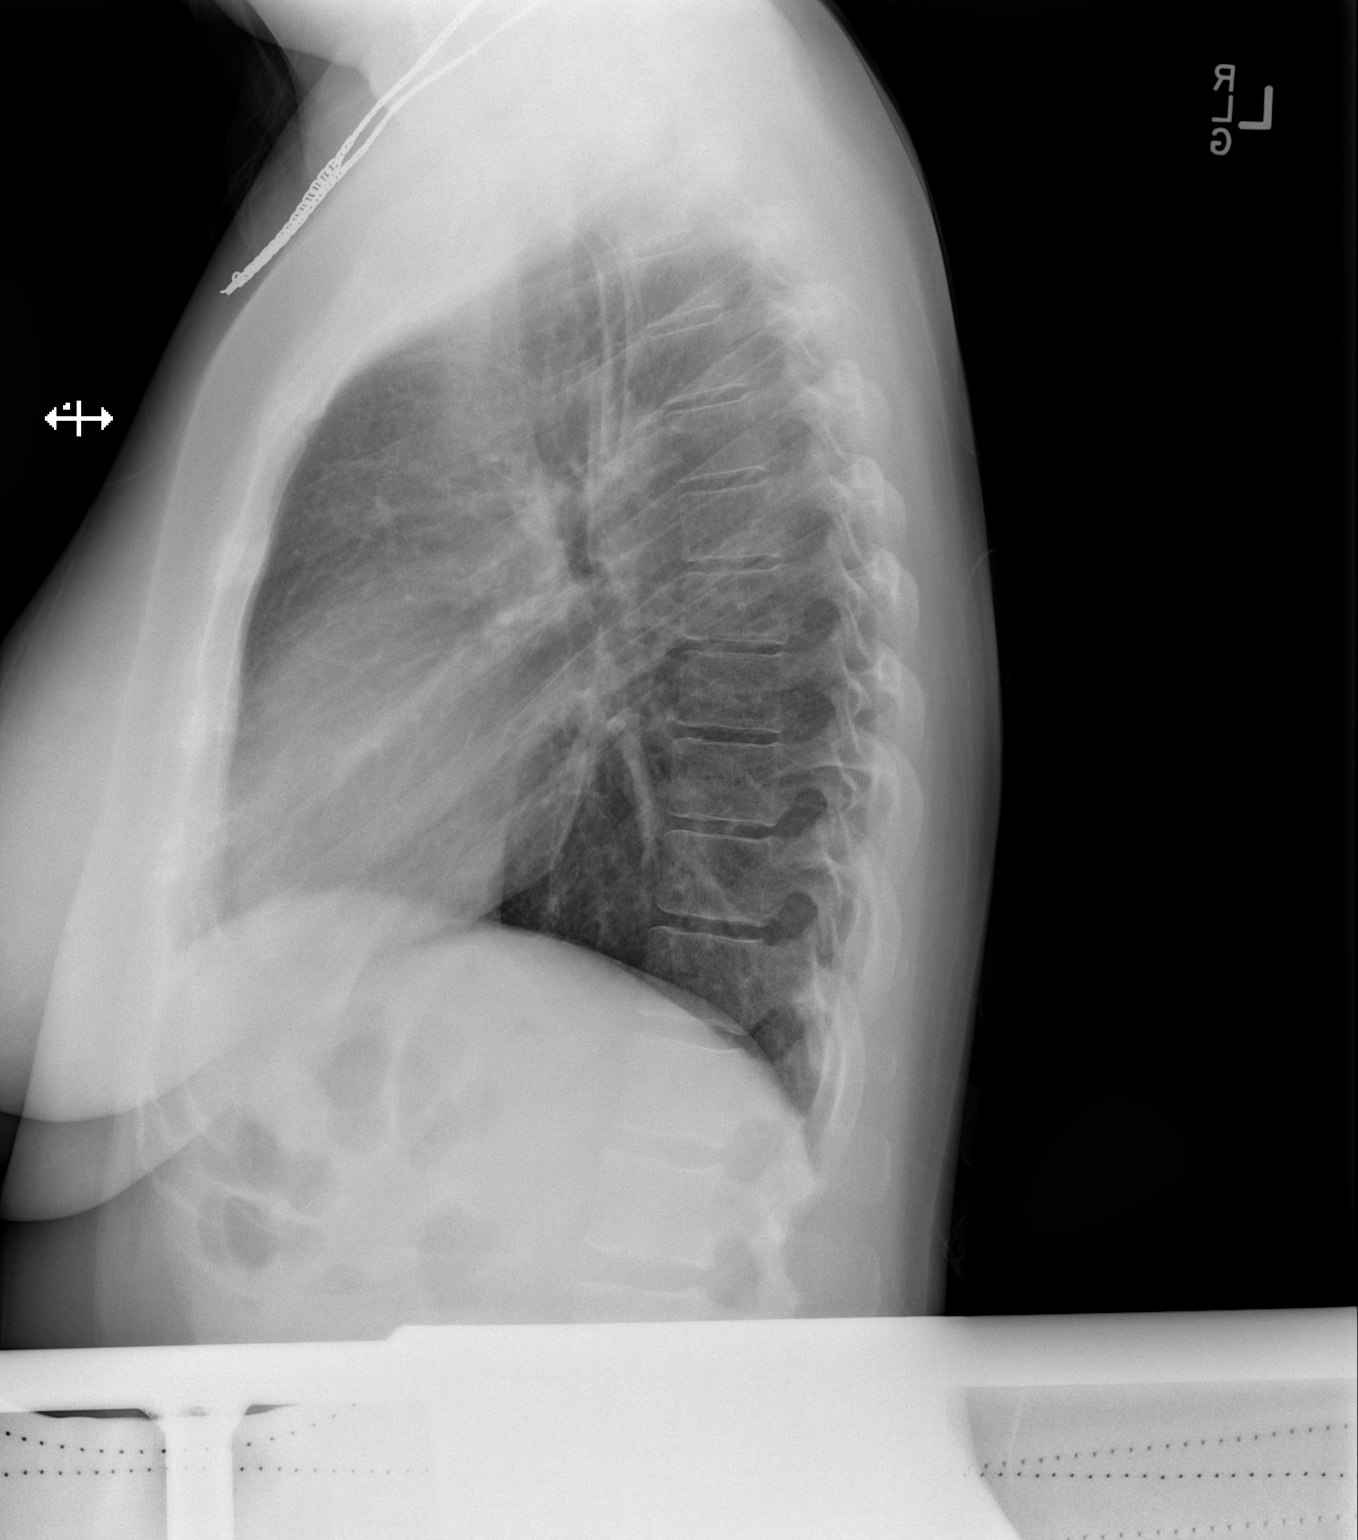

[2 of 2 positions shown; findings below may reference images not displayed]

FINDINGS: Lungs are clear. Heart size and pulmonary vascularity are normal. No
adenopathy. No pneumothorax. No bone lesions.
IMPRESSION: No edema or consolidation.

## 2018-07-21 ENCOUNTER — Encounter: Payer: Self-pay | Admitting: Advanced Practice Midwife

## 2018-07-24 ENCOUNTER — Encounter: Payer: Self-pay | Admitting: Advanced Practice Midwife

## 2018-10-18 ENCOUNTER — Telehealth: Payer: Self-pay | Admitting: *Deleted

## 2018-10-18 ENCOUNTER — Encounter: Payer: Self-pay | Admitting: *Deleted

## 2018-10-18 DIAGNOSIS — R1310 Dysphagia, unspecified: Secondary | ICD-10-CM | POA: Insufficient documentation

## 2018-10-18 NOTE — Telephone Encounter (Signed)
Working on Electrical engineer form for patients. Called her to get some information to finish. Asked patient to call back and ask for me.

## 2019-02-26 ENCOUNTER — Telehealth: Payer: Self-pay | Admitting: *Deleted

## 2019-02-26 NOTE — Telephone Encounter (Signed)
Called patient back, she is having records sent to [place]. She had care at [facility] but the family is paying for it in [place] using [employer]. Advised when we receive records we will get her scheduled. They will also send order for ultrasound.

## 2019-02-26 NOTE — Telephone Encounter (Signed)
Pt is pregnant with surrogate baby. She is around 8 weeks now. She needs to have another ultrasound around 03/19/2019 per the fertility clinic. They are in [place] so she'd like to do it here.

## 2019-02-28 ENCOUNTER — Encounter: Payer: Self-pay | Admitting: Advanced Practice Midwife

## 2019-02-28 ENCOUNTER — Other Ambulatory Visit: Payer: Self-pay | Admitting: Women's Health

## 2019-02-28 ENCOUNTER — Encounter: Payer: Self-pay | Admitting: *Deleted

## 2019-02-28 ENCOUNTER — Telehealth: Payer: Self-pay | Admitting: *Deleted

## 2019-02-28 MED ORDER — BONJESTA 20-20 MG PO TBCR: 1.0000 | EXTENDED_RELEASE_TABLET | Freq: Every day | ORAL | 8 refills | Status: DC

## 2019-02-28 NOTE — Telephone Encounter (Signed)
Pt requesting rx for nausea. She is [redacted]w[redacted]d. Will be getting PNC in our office. We are awaiting records from the infertility doctors. She is pregnant as a surrogate.

## 2019-03-01 ENCOUNTER — Other Ambulatory Visit: Payer: Self-pay | Admitting: Women's Health

## 2019-03-01 MED ORDER — BONJESTA 20-20 MG PO TBCR: 1.0000 | EXTENDED_RELEASE_TABLET | Freq: Every day | ORAL | 8 refills | Status: DC

## 2019-03-07 ENCOUNTER — Encounter: Payer: Self-pay | Admitting: *Deleted

## 2019-03-07 ENCOUNTER — Encounter: Payer: Self-pay | Admitting: Advanced Practice Midwife

## 2019-03-08 ENCOUNTER — Encounter: Payer: Self-pay | Admitting: Advanced Practice Midwife

## 2019-03-09 ENCOUNTER — Other Ambulatory Visit: Payer: Self-pay | Admitting: Obstetrics & Gynecology

## 2019-03-09 DIAGNOSIS — O3680X0 Pregnancy with inconclusive fetal viability, not applicable or unspecified: Secondary | ICD-10-CM

## 2019-03-19 ENCOUNTER — Ambulatory Visit: Admitting: *Deleted

## 2019-03-19 ENCOUNTER — Ambulatory Visit (INDEPENDENT_AMBULATORY_CARE_PROVIDER_SITE_OTHER)

## 2019-03-19 ENCOUNTER — Other Ambulatory Visit: Payer: Self-pay

## 2019-03-19 ENCOUNTER — Ambulatory Visit (INDEPENDENT_AMBULATORY_CARE_PROVIDER_SITE_OTHER): Admitting: Advanced Practice Midwife

## 2019-03-19 ENCOUNTER — Encounter: Payer: Self-pay | Admitting: Advanced Practice Midwife

## 2019-03-19 VITALS — BP 111/74 | HR 99 | Wt 136.0 lb

## 2019-03-19 DIAGNOSIS — R87612 Low grade squamous intraepithelial lesion on cytologic smear of cervix (LGSIL): Secondary | ICD-10-CM | POA: Insufficient documentation

## 2019-03-19 DIAGNOSIS — Z3A10 10 weeks gestation of pregnancy: Secondary | ICD-10-CM

## 2019-03-19 DIAGNOSIS — Z113 Encounter for screening for infections with a predominantly sexual mode of transmission: Secondary | ICD-10-CM

## 2019-03-19 DIAGNOSIS — Z1379 Encounter for other screening for genetic and chromosomal anomalies: Secondary | ICD-10-CM

## 2019-03-19 DIAGNOSIS — O0990 Supervision of high risk pregnancy, unspecified, unspecified trimester: Secondary | ICD-10-CM | POA: Insufficient documentation

## 2019-03-19 DIAGNOSIS — Z333 Pregnant state, gestational carrier: Secondary | ICD-10-CM | POA: Insufficient documentation

## 2019-03-19 DIAGNOSIS — O3680X0 Pregnancy with inconclusive fetal viability, not applicable or unspecified: Secondary | ICD-10-CM | POA: Diagnosis not present

## 2019-03-19 DIAGNOSIS — Z331 Pregnant state, incidental: Secondary | ICD-10-CM

## 2019-03-19 DIAGNOSIS — Z3481 Encounter for supervision of other normal pregnancy, first trimester: Secondary | ICD-10-CM

## 2019-03-19 DIAGNOSIS — Z1389 Encounter for screening for other disorder: Secondary | ICD-10-CM

## 2019-03-19 DIAGNOSIS — Z3401 Encounter for supervision of normal first pregnancy, first trimester: Secondary | ICD-10-CM

## 2019-03-19 DIAGNOSIS — Z36 Encounter for antenatal screening for chromosomal anomalies: Secondary | ICD-10-CM

## 2019-03-19 LAB — POCT URINALYSIS DIPSTICK OB
Blood, UA: NEGATIVE
Glucose, UA: NEGATIVE
Ketones, UA: NEGATIVE
Leukocytes, UA: NEGATIVE
Nitrite, UA: NEGATIVE
POC,PROTEIN,UA: NEGATIVE

## 2019-03-19 NOTE — Progress Notes (Signed)
INITIAL OBSTETRICAL VISIT Patient name: [name] MRN [id number]  Date of birth: June 08, 1980 Chief Complaint:   Initial Prenatal Visit  History of Present Illness:   [name] is a 38 y.o. G34P1002 Caucasian female at [redacted]w[redacted]d by LMP/embryo transfer with an Estimated Date of Delivery: 10/11/19 being seen today for her initial obstetrical visit.   Her obstetrical history is significant for CS X2 with BTL.  She is a gestational carrier for a [place] couple.  Initial embryo transfer was a boy and a girl, only one embryo stayed.  Fathers of the baby want all genetic testing done despite having had embryonic testing done (normal).    Today she reports no complaints PNV were making her nauseated, so plans to go to chewables for a while. .  Patient's last menstrual period was 01/04/2019 (exact date). Last pap 3/20. Results were: LSIL, pt chose 1 year follow up. Review of Systems:   Pertinent items are noted in HPI Denies cramping/contractions, leakage of fluid, vaginal bleeding, abnormal vaginal discharge w/ itching/odor/irritation, headaches, visual changes, shortness of breath, chest pain, abdominal pain, severe nausea/vomiting, or problems with urination or bowel movements unless otherwise stated above.  Pertinent History Reviewed:  Reviewed past medical,surgical, social, obstetrical and family history.  Reviewed problem list, medications and allergies. OB History  Gravida Para Term Preterm AB Living  3 2 2     2   SAB TAB Ectopic Multiple Live Births          2    # Outcome Date GA Lbr Len/2nd Weight Sex Delivery Anes PTL Lv  3 Current           2 Term 02/07/03 [redacted]w[redacted]d  5 lb 7 oz (2.466 kg) M CS-LTranv Spinal N LIV  1 Term 04/03/97 [redacted]w[redacted]d  6 lb 7 oz (2.92 kg) M CS-LTranv EPI N LIV     Complications: Failure to Progress in First Stage   Physical Assessment:   Vitals:   03/19/19 1409  BP: 111/74  Pulse: 99  Weight: 136 lb (61.7 kg)  Body mass index is 25.7 kg/m.       Physical  Examination:  General appearance - well appearing, and in no distress  Mental status - alert, oriented to person, place, and time  Psych:  She has a normal mood and affect  Skin - warm and dry, normal color, no suspicious lesions noted  Chest - effort normal, all lung fields clear to auscultation bilaterally  Heart - normal rate and regular rhythm  Abdomen - soft, nontender  Extremities:  No swelling or varicosities noted      via [place] 10+4 wks,single IUP,fht 161 bpm,normal ovaries,crl 36.88 mm  No results found for this or any previous visit (from the past 24 hour(s)).  Assessment & Plan:  1) Low-Risk Pregnancy G3P1002 at [redacted]w[redacted]d with an Estimated Date of Delivery: 10/13/19   2) Initial OB visit  3) Gestational Carrier s/p embryo transfer  Meds: No orders of the defined types were placed in this encounter.   Initial labs obtained Continue prenatal vitamins Reviewed n/v relief measures and warning s/s to report Reviewed recommended weight gain based on pre-gravid BMI Encouraged well-balanced diet Watched video for carrier screening/genetic testing:  Genetic Screening discussed First Screen and Integrated Screen: requested  Ultrasound discussed; fetal survey: requested [place] completed  Follow-up: Return in about 10 days (around 03/29/2019) for NT/IT only; 4 weeks for LROB.   Orders Placed This Encounter  Procedures  . Urine Culture  .  GC/Chlamydia Probe Amp  . Pain Management Screening Profile (10S)  . Obstetric Panel, Including HIV  . MaterniT 21 plus Core, Blood  . POC Urinalysis Dipstick OB    Jacklyn Shell DNP, CNM 03/19/2019 3:06 PM

## 2019-03-19 NOTE — Patient Instructions (Signed)
 First Trimester of Pregnancy The first trimester of pregnancy is from week 1 until the end of week 12 (months 1 through 3). A week after a sperm fertilizes an egg, the egg will implant on the wall of the uterus. This embryo will begin to develop into a baby. Genes from you and your partner are forming the baby. The female genes determine whether the baby is a boy or a girl. At 6-8 weeks, the eyes and face are formed, and the heartbeat can be seen on ultrasound. At the end of 12 weeks, all the baby's organs are formed.  Now that you are pregnant, you will want to do everything you can to have a healthy baby. Two of the most important things are to get good prenatal care and to follow your health care provider's instructions. Prenatal care is all the medical care you receive before the baby's birth. This care will help prevent, find, and treat any problems during the pregnancy and childbirth. BODY CHANGES Your body goes through many changes during pregnancy. The changes vary from woman to woman.   You may gain or lose a couple of pounds at first.  You may feel sick to your stomach (nauseous) and throw up (vomit). If the vomiting is uncontrollable, call your health care provider.  You may tire easily.  You may develop headaches that can be relieved by medicines approved by your health care provider.  You may urinate more often. Painful urination may mean you have a bladder infection.  You may develop heartburn as a result of your pregnancy.  You may develop constipation because certain hormones are causing the muscles that push waste through your intestines to slow down.  You may develop hemorrhoids or swollen, bulging veins (varicose veins).  Your breasts may begin to grow larger and become tender. Your nipples may stick out more, and the tissue that surrounds them (areola) may become darker.  Your gums may bleed and may be sensitive to brushing and flossing.  Dark spots or blotches  (chloasma, mask of pregnancy) may develop on your face. This will likely fade after the baby is born.  Your menstrual periods will stop.  You may have a loss of appetite.  You may develop cravings for certain kinds of food.  You may have changes in your emotions from day to day, such as being excited to be pregnant or being concerned that something may go wrong with the pregnancy and baby.  You may have more vivid and strange dreams.  You may have changes in your hair. These can include thickening of your hair, rapid growth, and changes in texture. Some women also have hair loss during or after pregnancy, or hair that feels dry or thin. Your hair will most likely return to normal after your baby is born. WHAT TO EXPECT AT YOUR PRENATAL VISITS During a routine prenatal visit:  You will be weighed to make sure you and the baby are growing normally.  Your blood pressure will be taken.  Your abdomen will be measured to track your baby's growth.  The fetal heartbeat will be listened to starting around week 10 or 12 of your pregnancy.  Test results from any previous visits will be discussed. Your health care provider may ask you:  How you are feeling.  If you are feeling the baby move.  If you have had any abnormal symptoms, such as leaking fluid, bleeding, severe headaches, or abdominal cramping.  If you have any questions. Other   tests that may be performed during your first trimester include:  Blood tests to find your blood type and to check for the presence of any previous infections. They will also be used to check for low iron levels (anemia) and Rh antibodies. Later in the pregnancy, blood tests for diabetes will be done along with other tests if problems develop.  Urine tests to check for infections, diabetes, or protein in the urine.  An ultrasound to confirm the proper growth and development of the baby.  An amniocentesis to check for possible genetic problems.  Fetal  screens for spina bifida and Down syndrome.  You may need other tests to make sure you and the baby are doing well. HOME CARE INSTRUCTIONS  Medicines  Follow your health care provider's instructions regarding medicine use. Specific medicines may be either safe or unsafe to take during pregnancy.  Take your prenatal vitamins as directed.  If you develop constipation, try taking a stool softener if your health care provider approves. Diet  Eat regular, well-balanced meals. Choose a variety of foods, such as meat or vegetable-based protein, fish, milk and low-fat dairy products, vegetables, fruits, and whole grain breads and cereals. Your health care provider will help you determine the amount of weight gain that is right for you.  Avoid raw meat and uncooked cheese. These carry germs that can cause birth defects in the baby.  Eating four or five small meals rather than three large meals a day may help relieve nausea and vomiting. If you start to feel nauseous, eating a few soda crackers can be helpful. Drinking liquids between meals instead of during meals also seems to help nausea and vomiting.  If you develop constipation, eat more high-fiber foods, such as fresh vegetables or fruit and whole grains. Drink enough fluids to keep your urine clear or pale yellow. Activity and Exercise  Exercise only as directed by your health care provider. Exercising will help you:  Control your weight.  Stay in shape.  Be prepared for labor and delivery.  Experiencing pain or cramping in the lower abdomen or low back is a good sign that you should stop exercising. Check with your health care provider before continuing normal exercises.  Try to avoid standing for long periods of time. Move your legs often if you must stand in one place for a long time.  Avoid heavy lifting.  Wear low-heeled shoes, and practice good posture.  You may continue to have sex unless your health care provider directs you  otherwise. Relief of Pain or Discomfort  Wear a good support bra for breast tenderness.   Take warm sitz baths to soothe any pain or discomfort caused by hemorrhoids. Use hemorrhoid cream if your health care provider approves.   Rest with your legs elevated if you have leg cramps or low back pain.  If you develop varicose veins in your legs, wear support hose. Elevate your feet for 15 minutes, 3-4 times a day. Limit salt in your diet. Prenatal Care  Schedule your prenatal visits by the twelfth week of pregnancy. They are usually scheduled monthly at first, then more often in the last 2 months before delivery.  Write down your questions. Take them to your prenatal visits.  Keep all your prenatal visits as directed by your health care provider. Safety  Wear your seat belt at all times when driving.  Make a list of emergency phone numbers, including numbers for family, friends, the hospital, and police and fire departments. General   Tips  Ask your health care provider for a referral to a local prenatal education class. Begin classes no later than at the beginning of month 6 of your pregnancy.  Ask for help if you have counseling or nutritional needs during pregnancy. Your health care provider can offer advice or refer you to specialists for help with various needs.  Do not use hot tubs, steam rooms, or saunas.  Do not douche or use tampons or scented sanitary pads.  Do not cross your legs for long periods of time.  Avoid cat litter boxes and soil used by cats. These carry germs that can cause birth defects in the baby and possibly loss of the fetus by miscarriage or stillbirth.  Avoid all smoking, herbs, alcohol, and medicines not prescribed by your health care provider. Chemicals in these affect the formation and growth of the baby.  Schedule a dentist appointment. At home, brush your teeth with a soft toothbrush and be gentle when you floss. SEEK MEDICAL CARE IF:   You have  dizziness.  You have mild pelvic cramps, pelvic pressure, or nagging pain in the abdominal area.  You have persistent nausea, vomiting, or diarrhea.  You have a bad smelling vaginal discharge.  You have pain with urination.  You notice increased swelling in your face, hands, legs, or ankles. SEEK IMMEDIATE MEDICAL CARE IF:   You have a fever.  You are leaking fluid from your vagina.  You have spotting or bleeding from your vagina.  You have severe abdominal cramping or pain.  You have rapid weight gain or loss.  You vomit blood or material that looks like coffee grounds.  You are exposed to German measles and have never had them.  You are exposed to fifth disease or chickenpox.  You develop a severe headache.  You have shortness of breath.  You have any kind of trauma, such as from a fall or a car accident. Document Released: 03/02/2001 Document Revised: 07/23/2013 Document Reviewed: 01/16/2013 [employer] Patient Information 2015 [employer]. This information is not intended to replace advice given to you by your health care provider. Make sure you discuss any questions you have with your health care provider.   Nausea & Vomiting  Have saltine crackers or pretzels by your bed and eat a few bites before you raise your head out of bed in the morning  Eat small frequent meals throughout the day instead of large meals  Drink plenty of fluids throughout the day to stay hydrated, just don't drink a lot of fluids with your meals.  This can make your stomach fill up faster making you feel sick  Do not brush your teeth right after you eat  Products with real ginger are good for nausea, like ginger ale and ginger hard candy Make sure it says made with real ginger!  Sucking on sour candy like lemon heads is also good for nausea  If your prenatal vitamins make you nauseated, take them at night so you will sleep through the nausea  Sea Bands  If you feel like you need  medicine for the nausea & vomiting please let us know  If you are unable to keep any fluids or food down please let us know   Constipation  Drink plenty of fluid, preferably water, throughout the day  Eat foods high in fiber such as fruits, vegetables, and grains  Exercise, such as walking, is a good way to keep your bowels regular  Drink warm fluids, especially warm   prune juice, or decaf coffee  Eat a 1/2 cup of real oatmeal (not instant), 1/2 cup applesauce, and 1/2-1 cup warm prune juice every day  If needed, you may take Colace (docusate sodium) stool softener once or twice a day to help keep the stool soft. If you are pregnant, wait until you are out of your first trimester (12-14 weeks of pregnancy)  If you still are having problems with constipation, you may take Miralax once daily as needed to help keep your bowels regular.  If you are pregnant, wait until you are out of your first trimester (12-14 weeks of pregnancy)  Safe Medications in Pregnancy   Acne: Benzoyl Peroxide Salicylic Acid  Backache/Headache: Tylenol: 2 regular strength every 4 hours OR              2 Extra strength every 6 hours  Colds/Coughs/Allergies: Benadryl (alcohol free) 25 mg every 6 hours as needed Breath right strips Claritin Cepacol throat lozenges Chloraseptic throat spray Cold-Eeze- up to three times per day Cough drops, alcohol free Flonase (by prescription only) Guaifenesin Mucinex Robitussin DM (plain only, alcohol free) Saline nasal spray/drops Sudafed (pseudoephedrine) & Actifed ** use only after [redacted] weeks gestation and if you do not have high blood pressure Tylenol Vicks Vaporub Zinc lozenges Zyrtec   Constipation: Colace Ducolax suppositories Fleet enema Glycerin suppositories Metamucil Milk of magnesia Miralax Senokot Smooth move tea  Diarrhea: Kaopectate Imodium A-D  *NO pepto Bismol  Hemorrhoids: Anusol Anusol HC Preparation  H Tucks  Indigestion: Tums Maalox Mylanta Zantac  Pepcid  Insomnia: Benadryl (alcohol free) 25mg every 6 hours as needed Tylenol PM Unisom, no Gelcaps  Leg Cramps: Tums MagGel  Nausea/Vomiting:  Bonine Dramamine Emetrol Ginger extract Sea bands Meclizine  Nausea medication to take during pregnancy:  Unisom (doxylamine succinate 25 mg tablets) Take one tablet daily at bedtime. If symptoms are not adequately controlled, the dose can be increased to a maximum recommended dose of two tablets daily (1/2 tablet in the morning, 1/2 tablet mid-afternoon and one at bedtime). Vitamin B6 100mg tablets. Take one tablet twice a day (up to 200 mg per day).  Skin Rashes: Aveeno products Benadryl cream or 25mg every 6 hours as needed Calamine Lotion 1% cortisone cream  Yeast infection: Gyne-lotrimin 7 Monistat 7   **If taking multiple medications, please check labels to avoid duplicating the same active ingredients **take medication as directed on the label ** Do not exceed 4000 mg of tylenol in 24 hours **Do not take medications that contain aspirin or ibuprofen      

## 2019-03-19 NOTE — Progress Notes (Signed)
[place] 10+4 wks,single IUP,fht 161 bpm,normal ovaries,crl 36.88 mm

## 2019-03-20 LAB — PMP SCREEN PROFILE (10S), URINE
Amphetamine Scrn, Ur: NEGATIVE ng/mL
BARBITURATE SCREEN URINE: NEGATIVE ng/mL
BENZODIAZEPINE SCREEN, URINE: NEGATIVE ng/mL
CANNABINOIDS UR QL SCN: NEGATIVE ng/mL
Cocaine (Metab) Scrn, Ur: NEGATIVE ng/mL
Creatinine(Crt), U: 152.9 mg/dL (ref 20.0–300.0)
Methadone Screen, Urine: NEGATIVE ng/mL
OXYCODONE+OXYMORPHONE UR QL SCN: NEGATIVE ng/mL
Opiate Scrn, Ur: NEGATIVE ng/mL
Ph of Urine: 5.2 (ref 4.5–8.9)
Phencyclidine Qn, Ur: NEGATIVE ng/mL
Propoxyphene Scrn, Ur: NEGATIVE ng/mL

## 2019-03-20 LAB — OBSTETRIC PANEL, INCLUDING HIV
Antibody Screen: NEGATIVE
Basophils Absolute: 0 10*3/uL (ref 0.0–0.2)
Basos: 1 %
EOS (ABSOLUTE): 0.1 10*3/uL (ref 0.0–0.4)
Eos: 3 %
HIV Screen 4th Generation wRfx: NONREACTIVE
Hematocrit: 40.3 % (ref 34.0–46.6)
Hemoglobin: 13.3 g/dL (ref 11.1–15.9)
Hepatitis B Surface Ag: NEGATIVE
Immature Grans (Abs): 0 10*3/uL (ref 0.0–0.1)
Immature Granulocytes: 0 %
Lymphocytes Absolute: 1.2 10*3/uL (ref 0.7–3.1)
Lymphs: 27 %
MCH: 29.8 pg (ref 26.6–33.0)
MCHC: 33 g/dL (ref 31.5–35.7)
MCV: 90 fL (ref 79–97)
Monocytes Absolute: 0.5 10*3/uL (ref 0.1–0.9)
Monocytes: 12 %
Neutrophils Absolute: 2.6 10*3/uL (ref 1.4–7.0)
Neutrophils: 57 %
Platelets: 271 10*3/uL (ref 150–450)
RBC: 4.46 x10E6/uL (ref 3.77–5.28)
RDW: 12.9 % (ref 11.7–15.4)
RPR Ser Ql: NONREACTIVE
Rh Factor: POSITIVE
Rubella Antibodies, IGG: 1.03 index (ref >0.99)
WBC: 4.5 10*3/uL (ref 3.4–10.8)

## 2019-03-21 LAB — GC/CHLAMYDIA PROBE AMP
Chlamydia trachomatis, NAA: NEGATIVE
Neisseria Gonorrhoeae by PCR: NEGATIVE

## 2019-03-21 LAB — URINE CULTURE

## 2019-03-24 LAB — MATERNIT 21 PLUS CORE, BLOOD
Fetal Fraction: 13
Result (T21): NEGATIVE
Trisomy 13 (Patau syndrome): NEGATIVE
Trisomy 18 (Edwards syndrome): NEGATIVE
Trisomy 21 (Down syndrome): NEGATIVE

## 2019-03-29 ENCOUNTER — Other Ambulatory Visit: Payer: Self-pay | Admitting: Obstetrics & Gynecology

## 2019-03-29 DIAGNOSIS — Z3682 Encounter for antenatal screening for nuchal translucency: Secondary | ICD-10-CM

## 2019-03-30 ENCOUNTER — Other Ambulatory Visit

## 2019-03-30 ENCOUNTER — Ambulatory Visit (INDEPENDENT_AMBULATORY_CARE_PROVIDER_SITE_OTHER)

## 2019-03-30 ENCOUNTER — Other Ambulatory Visit: Payer: Self-pay

## 2019-03-30 DIAGNOSIS — Z3682 Encounter for antenatal screening for nuchal translucency: Secondary | ICD-10-CM

## 2019-03-30 DIAGNOSIS — Z3A12 12 weeks gestation of pregnancy: Secondary | ICD-10-CM

## 2019-03-30 NOTE — Progress Notes (Signed)
[place] 12+1 wks,measurements c/w dates,crl 57.09 mm,normal ovaries,NB present,NT 1.3 mm,fhr 173 bpm

## 2019-04-03 LAB — INTEGRATED 1
Crown Rump Length: 57.1 mm
Gest. Age on Collection Date: 12.1 weeks
Maternal Age at EDD: 40.2 yr
Nuchal Translucency (NT): 1.3 mm
Number of Fetuses: 1
PAPP-A Value: 1639.5 ng/mL
Weight: 134 [lb_av]

## 2019-04-11 ENCOUNTER — Ambulatory Visit (INDEPENDENT_AMBULATORY_CARE_PROVIDER_SITE_OTHER): Admitting: Advanced Practice Midwife

## 2019-04-11 ENCOUNTER — Telehealth: Payer: Self-pay | Admitting: *Deleted

## 2019-04-11 ENCOUNTER — Other Ambulatory Visit: Payer: Self-pay

## 2019-04-11 ENCOUNTER — Encounter: Payer: Self-pay | Admitting: Advanced Practice Midwife

## 2019-04-11 VITALS — BP 111/79 | HR 92 | Wt 134.0 lb

## 2019-04-11 DIAGNOSIS — Z3481 Encounter for supervision of other normal pregnancy, first trimester: Secondary | ICD-10-CM

## 2019-04-11 DIAGNOSIS — Z1389 Encounter for screening for other disorder: Secondary | ICD-10-CM

## 2019-04-11 DIAGNOSIS — O219 Vomiting of pregnancy, unspecified: Secondary | ICD-10-CM

## 2019-04-11 DIAGNOSIS — Z3A13 13 weeks gestation of pregnancy: Secondary | ICD-10-CM

## 2019-04-11 DIAGNOSIS — Z331 Pregnant state, incidental: Secondary | ICD-10-CM

## 2019-04-11 LAB — POCT URINALYSIS DIPSTICK OB
Blood, UA: NEGATIVE
Glucose, UA: NEGATIVE
Ketones, UA: NEGATIVE
Leukocytes, UA: NEGATIVE
Nitrite, UA: NEGATIVE
POC,PROTEIN,UA: NEGATIVE

## 2019-04-11 MED ORDER — ONDANSETRON 4 MG PO TBDP: 4.0000 mg | ORAL_TABLET | Freq: Three times a day (TID) | ORAL | 1 refills | Status: DC | PRN

## 2019-04-11 NOTE — Telephone Encounter (Signed)
Patients husband called stating patient is having sever vertigo, especially when standing and vomiting.  She has been taking Bonjesta for nausea and it has been working.  Denies fever or any other symptoms.  Informed patient she could be dehydrated but will w/i for eval with provider.  Pt verbalized understanding and agreeable to plan.

## 2019-04-11 NOTE — Progress Notes (Signed)
   LOW-RISK PREGNANCY VISIT Patient name: [name] MRN [id number]  Date of birth: November 09, 1980 Chief Complaint:   work in ob (severe vertigo and vomiting)  History of Present Illness:   [name] is a 39 y.o. G41P2002 female at [redacted]w[redacted]d with an Estimated Date of Delivery: 10/11/19 being seen today for work-in due to nausea/vertigo/vomiting since yesterday morning. Has been taking Bonjesta with no relief. Is able to keep down water, soup and crackers. No fever, bleeding, or other concerning s/s.  Review of Systems:   Pertinent items are noted in HPI Denies abnormal vaginal discharge w/ itching/odor/irritation, headaches, visual changes, shortness of breath, chest pain, abdominal pain, or problems with urination or bowel movements unless otherwise stated above. Pertinent History Reviewed:  Reviewed past medical,surgical, social, obstetrical and family history.  Reviewed problem list, medications and allergies. Physical Assessment:   Vitals:   04/11/19 1136  BP: 111/79  Pulse: 92  Weight: 134 lb (60.8 kg)  Body mass index is 25.32 kg/m.        Physical Examination:   General appearance: Well appearing, and in no distress  Mental status: Alert, oriented to person, place, and time  Skin: Warm & dry  Cardiovascular: Normal heart rate noted  Respiratory: Normal respiratory effort, no distress  Abdomen: Soft, gravid, nontender  Pelvic: Cervical exam deferred         Extremities: Edema: None  Fetal Status: Fetal Heart Rate (bpm): 145        Results for orders placed or performed in visit on 04/11/19 (from the past 24 hour(s))  POC Urinalysis Dipstick OB   Collection Time: 04/11/19 11:37 AM  Result Value Ref Range   Color, UA     Clarity, UA     Glucose, UA Negative Negative   Bilirubin, UA     Ketones, UA neg    Spec Grav, UA     Blood, UA neg    pH, UA     POC,PROTEIN,UA Negative Negative, Trace, Small (1+), Moderate (2+), Large (3+), 4+   Urobilinogen, UA     Nitrite, UA neg    Leukocytes, UA Negative Negative   Appearance     Odor      Assessment & Plan:  1) Low-risk pregnancy G3P2002 at [redacted]w[redacted]d with an Estimated Date of Delivery: 10/11/19   2) Nausea/vomiting of preg, neg ketones; rx Zofran ODT (son has severe rx to phenergan so she prefers not to take it); continue lying down and resting as much as possible; if no longer able to hold down food/fluids despite Zofran, may go to [facility] for IVF   Meds:  Meds ordered this encounter  Medications  . ondansetron (ZOFRAN ODT) 4 MG disintegrating tablet    Sig: Take 1-2 tablets (4-8 mg total) by mouth every 8 (eight) hours as needed for nausea or vomiting.    Dispense:  30 tablet    Refill:  1   Labs/procedures today: none  Plan:  Continue routine obstetrical care- has visit on 04/16/19.    Follow-up: No follow-ups on file.  Orders Placed This Encounter  Procedures  . POC Urinalysis Dipstick OB   Arabella Merles [facility] 04/11/2019 12:01 PM

## 2019-04-12 ENCOUNTER — Encounter (HOSPITAL_COMMUNITY): Payer: Self-pay | Admitting: Obstetrics & Gynecology

## 2019-04-12 ENCOUNTER — Inpatient Hospital Stay (HOSPITAL_COMMUNITY)
Admission: AD | Admit: 2019-04-12 | Discharge: 2019-04-12 | Disposition: A | Discharge status: Home / Self Care | Attending: Obstetrics & Gynecology | Admitting: Obstetrics & Gynecology

## 2019-04-12 DIAGNOSIS — Z888 Allergy status to other drugs, medicaments and biological substances status: Secondary | ICD-10-CM | POA: Diagnosis not present

## 2019-04-12 DIAGNOSIS — Z3481 Encounter for supervision of other normal pregnancy, first trimester: Secondary | ICD-10-CM

## 2019-04-12 DIAGNOSIS — O210 Mild hyperemesis gravidarum: Secondary | ICD-10-CM | POA: Diagnosis not present

## 2019-04-12 DIAGNOSIS — Z885 Allergy status to narcotic agent status: Secondary | ICD-10-CM | POA: Insufficient documentation

## 2019-04-12 DIAGNOSIS — Z881 Allergy status to other antibiotic agents status: Secondary | ICD-10-CM | POA: Diagnosis not present

## 2019-04-12 DIAGNOSIS — R87612 Low grade squamous intraepithelial lesion on cytologic smear of cervix (LGSIL): Secondary | ICD-10-CM

## 2019-04-12 DIAGNOSIS — E860 Dehydration: Secondary | ICD-10-CM | POA: Insufficient documentation

## 2019-04-12 DIAGNOSIS — Z880 Allergy status to penicillin: Secondary | ICD-10-CM | POA: Diagnosis not present

## 2019-04-12 DIAGNOSIS — R1110 Vomiting, unspecified: Secondary | ICD-10-CM | POA: Diagnosis present

## 2019-04-12 DIAGNOSIS — Z3A14 14 weeks gestation of pregnancy: Secondary | ICD-10-CM | POA: Diagnosis not present

## 2019-04-12 DIAGNOSIS — O99282 Endocrine, nutritional and metabolic diseases complicating pregnancy, second trimester: Secondary | ICD-10-CM | POA: Insufficient documentation

## 2019-04-12 LAB — CBC
HCT: 40.3 % (ref 36.0–46.0)
Hemoglobin: 13.3 g/dL (ref 12.0–15.0)
MCH: 29.8 pg (ref 26.0–34.0)
MCHC: 33 g/dL (ref 30.0–36.0)
MCV: 90.2 fL (ref 80.0–100.0)
Platelets: 310 10*3/uL (ref 150–400)
RBC: 4.47 MIL/uL (ref 3.87–5.11)
RDW: 12.8 % (ref 11.5–15.5)
WBC: 10.9 10*3/uL — ABNORMAL HIGH (ref 4.0–10.5)
nRBC: 0 % (ref 0.0–0.2)

## 2019-04-12 LAB — BASIC METABOLIC PANEL
Anion gap: 10 (ref 5–15)
BUN: 9 mg/dL (ref 6–20)
CO2: 23 mmol/L (ref 22–32)
Calcium: 9.2 mg/dL (ref 8.9–10.3)
Chloride: 101 mmol/L (ref 98–111)
Creatinine, Ser: 0.53 mg/dL (ref 0.44–1.00)
GFR calc Af Amer: >60 mL/min (ref >60)
GFR calc non Af Amer: >60 mL/min (ref >60)
Glucose, Bld: 91 mg/dL (ref 70–99)
Potassium: 4.4 mmol/L (ref 3.5–5.1)
Sodium: 134 mmol/L — ABNORMAL LOW (ref 135–145)

## 2019-04-12 LAB — URINALYSIS, ROUTINE W REFLEX MICROSCOPIC
Bilirubin Urine: NEGATIVE
Glucose, UA: NEGATIVE mg/dL
Hgb urine dipstick: NEGATIVE
Ketones, ur: 80 mg/dL — AB
Leukocytes,Ua: NEGATIVE
Nitrite: NEGATIVE
Protein, ur: 30 mg/dL — AB
Specific Gravity, Urine: 1.025 (ref 1.005–1.030)
pH: 5 (ref 5.0–8.0)

## 2019-04-12 MED ORDER — METOCLOPRAMIDE HCL 10 MG PO TABS: 10.0000 mg | ORAL_TABLET | Freq: Four times a day (QID) | ORAL | 0 refills | Status: DC | PRN

## 2019-04-12 MED ORDER — METOCLOPRAMIDE HCL 5 MG/ML IJ SOLN
10.0000 mg | Freq: Once | INTRAMUSCULAR | Status: AC
  Administered 2019-04-12: 10 mg via INTRAVENOUS
  Filled 2019-04-12: qty 2

## 2019-04-12 MED ORDER — LACTATED RINGERS IV BOLUS
1000.0000 mL | Freq: Once | INTRAVENOUS | Status: AC
  Administered 2019-04-12: 1000 mL via INTRAVENOUS

## 2019-04-12 NOTE — MAU Provider Note (Addendum)
History    CSN: [id number]  Arrival date and time: 04/12/19 1001   First Provider Initiated Contact with Patient 04/12/19 1144     Chief Complaint  Patient presents with  . Emesis   HPI  Patient is a 39 yo G3P2002 at 14 weeks presenting for nausea and vomiting. She has history of daily morning sickness which was alleviated by [place]. States that she stopped experiencing morning sickness one week ago. On Tuesday, she started experiencing more nausea and vomiting. She had a virtual prenatal visit and was prescribed Zofran, but she states that it has not helped. The last thing she was able to keep down were crackers at 10am yesterday along with some water. This morning it has gotten worse; she has vomited 4 or 5x. She also reports feeling dizzy, light-headed, and HA since this morning. She last took Zofran at 0800. Denies abdominal pain, constipation, diarrhea, fever, chills, sick contacts, tobacco and marijuana use.    OB History    Gravida  3   Para  2   Term  2   Preterm      AB      Living  2     SAB      TAB      Ectopic      Multiple      Live Births  2           Past Medical History:  Diagnosis Date  . Asthma    prn inhaler  . Finger mass, right 03/2013   index finger  . GERD (gastroesophageal reflux disease)   . H/O hiatal hernia   . History of esophageal stricture    multiple esophageal dilations - states x 12  . Migraine   . PONV (postoperative nausea and vomiting)    nausea only    Past Surgical History:  Procedure Laterality Date  . BALLOON DILATION  06/16/2011   Procedure: BALLOON DILATION;  Surgeon: Arta Silence, MD;  Location: [facility];  Service: Endoscopy;  Laterality: N/A;  . BALLOON DILATION  07/07/2011   Procedure: BALLOON DILATION;  Surgeon: Arta Silence, MD;  Location: [facility];  Service: Endoscopy;  Laterality: N/A;  . BALLOON DILATION  07/28/2011   Procedure: BALLOON DILATION;  Surgeon: Arta Silence, MD;  Location: [facility]  ENDOSCOPY;  Service: Endoscopy;  Laterality: N/A;  . [place]; 02/07/2003  . ESOPHAGOGASTRODUODENOSCOPY  07/07/2011  . ESOPHAGOGASTRODUODENOSCOPY  06/16/2011   Procedure: ESOPHAGOGASTRODUODENOSCOPY (EGD);  Surgeon: Arta Silence, MD;  Location: [facility];  Service: Endoscopy;  Laterality: N/A;  . ESOPHAGOGASTRODUODENOSCOPY  07/28/2011   Procedure: ESOPHAGOGASTRODUODENOSCOPY (EGD);  Surgeon: Arta Silence, MD;  Location: [facility];  Service: Endoscopy;  Laterality: N/A;  . ESOPHAGOGASTRODUODENOSCOPY (EGD) WITH PROPOFOL N/A 06/26/2014   Procedure: ESOPHAGOGASTRODUODENOSCOPY (EGD) WITH PROPOFOL;  Surgeon: Acquanetta Sit, MD;  Location: [facility] ENDOSCOPY;  Service: Endoscopy;  Laterality: N/A;  . FINGER MASS EXCISION Left    index finger  . FOREIGN BODY REMOVAL N/A 06/26/2014   Procedure: FOREIGN BODY REMOVAL;  Surgeon: Acquanetta Sit, MD;  Location: [facility] ENDOSCOPY;  Service: Endoscopy;  Laterality: N/A;  . MASS EXCISION Right 04/16/2013   Procedure: RIGHT INDEX EXCISION MASS;  Surgeon: Tennis Must, MD;  Location: [place];  Service: Orthopedics;  Laterality: Right;  . TUBAL LIGATION      Family History  Problem Relation Age of Onset  . Anesthesia problems Mother        post-op nausea  . Colon cancer Father  Social History   Tobacco Use  . Smoking status: Never Smoker  . Smokeless tobacco: Never Used  Substance Use Topics  . Alcohol use: No  . Drug use: No    Allergies:  Allergies  Allergen Reactions  . Doxycycline Rash  . Codeine Nausea And Vomiting  . Sucralfate Nausea And Vomiting  . Augmentin [Amoxicillin-Pot Clavulanate]     Pt states she recently took amoxicillin without incident, but cannot take the combination in Augmentin.   Has patient had a PCN reaction causing immediate rash, facial/tongue/throat swelling, SOB or lightheadedness with hypotension: No Has patient had a PCN reaction causing severe rash involving mucus membranes or skin necrosis:  No Has patient had a PCN reaction that required hospitalization No Has patient had a PCN reaction occurring within the last 10 years: No If all of the above answers are "NO    Medications Prior to Admission  Medication Sig Dispense Refill Last Dose  . acetaminophen (TYLENOL) 500 MG tablet Take 1,000 mg by mouth every 6 (six) hours as needed for mild pain, moderate pain, fever or headache.   04/12/2019 at Unknown time  . ondansetron (ZOFRAN ODT) 4 MG disintegrating tablet Take 1-2 tablets (4-8 mg total) by mouth every 8 (eight) hours as needed for nausea or vomiting. 30 tablet 1 04/12/2019 at Unknown time  . Pediatric Multiple Vit-C-FA (FLINSTONES GUMMIES OMEGA-3 DHA) CHEW Chew by mouth. Takes 2 daily   Past Week at Unknown time  . albuterol (PROVENTIL HFA;VENTOLIN HFA) 108 (90 BASE) MCG/ACT inhaler Inhale 2 puffs into the lungs every 4 (four) hours as needed for wheezing or shortness of breath.    Unknown at Unknown time  . Doxylamine-Pyridoxine ER (BONJESTA) 20-20 MG TBCR Take 1 tablet by mouth at bedtime. Can add 1 tablet in the morning if needed for nausea and vomiting 60 tablet 8  at not taking    Review of Systems  Constitutional: Negative for fever.  Respiratory: Negative for shortness of breath.   Cardiovascular: Negative for chest pain and palpitations.  Gastrointestinal: Positive for nausea and vomiting (see HPI). Negative for abdominal pain, constipation and diarrhea.  Neurological: Positive for dizziness, light-headedness and headaches (see HPI).   Physical Exam   Blood pressure 103/60, pulse 75, temperature 97.9 F (36.6 C), temperature source Axillary, resp. rate 18, weight 60.2 kg, last menstrual period 01/04/2019, SpO2 99 %.   Today's Vitals   04/12/19 1025 04/12/19 1047 04/12/19 1052  BP: (!) 163/92 103/60   Pulse: 94 75   Resp: 18    Temp: 97.9 F (36.6 C)    TempSrc: Axillary    SpO2: 99%    Weight: 60.2 kg    PainSc:   0-No pain   Body mass index is 25.07  kg/m.  Physical Exam  Constitutional: She is oriented to person, place, and time. She appears well-developed and well-nourished.  HENT:  Head: Normocephalic and atraumatic.  Eyes: Conjunctivae and EOM are normal.  Cardiovascular: Normal rate.  Respiratory: Effort normal. No respiratory distress.  GI: There is no abdominal tenderness. There is no guarding.  Musculoskeletal:        General: Normal range of motion.     Cervical back: Normal range of motion.  Neurological: She is alert and oriented to person, place, and time.  Skin: Skin is warm and dry.  Psychiatric:  Patient is upset and frustrated.   FHT 150 .Marland Kitchen Results for orders placed or performed during the hospital encounter of 04/12/19 (from the past 24  hour(s))  Urinalysis, Routine w reflex microscopic     Status: Abnormal   Collection Time: 04/12/19 11:48 AM  Result Value Ref Range   Color, Urine AMBER (A) YELLOW   APPearance CLOUDY (A) CLEAR   Specific Gravity, Urine 1.025 1.005 - 1.030   pH 5.0 5.0 - 8.0   Glucose, UA NEGATIVE NEGATIVE mg/dL   Hgb urine dipstick NEGATIVE NEGATIVE   Bilirubin Urine NEGATIVE NEGATIVE   Ketones, ur 80 (A) NEGATIVE mg/dL   Protein, ur 30 (A) NEGATIVE mg/dL   Nitrite NEGATIVE NEGATIVE   Leukocytes,Ua NEGATIVE NEGATIVE   RBC / HPF 0-5 0 - 5 RBC/hpf   WBC, UA 11-20 0 - 5 WBC/hpf   Bacteria, UA RARE (A) NONE SEEN   Squamous Epithelial / LPF 21-50 0 - 5   Mucus PRESENT    Hyaline Casts, UA PRESENT   CBC     Status: Abnormal   Collection Time: 04/12/19  1:02 PM  Result Value Ref Range   WBC 10.9 (H) 4.0 - 10.5 K/uL   RBC 4.47 3.87 - 5.11 MIL/uL   Hemoglobin 13.3 12.0 - 15.0 g/dL   HCT 58.8 50.2 - 77.4 %   MCV 90.2 80.0 - 100.0 fL   MCH 29.8 26.0 - 34.0 pg   MCHC 33.0 30.0 - 36.0 g/dL   RDW 12.8 78.6 - 76.7 %   Platelets 310 150 - 400 K/uL   nRBC 0.0 0.0 - 0.2 %  Basic metabolic panel     Status: Abnormal   Collection Time: 04/12/19  1:02 PM  Result Value Ref Range   Sodium 134  (L) 135 - 145 mmol/L   Potassium 4.4 3.5 - 5.1 mmol/L   Chloride 101 98 - 111 mmol/L   CO2 23 22 - 32 mmol/L   Glucose, Bld 91 70 - 99 mg/dL   BUN 9 6 - 20 mg/dL   Creatinine, Ser 2.09 0.44 - 1.00 mg/dL   Calcium 9.2 8.9 - 47.0 mg/dL   GFR calc non Af Amer >60 >60 mL/min   GFR calc Af Amer >60 >60 mL/min   Anion gap 10 5 - 15   MAU Course  Procedures  None.   MDM Administered IV LR, Reglan.  Patient's symptoms improved, reported feeling much better, no further emesis Tolerated PO trial of sprite.  Discharged home in stable condition.     Meds ordered this encounter  Medications  . lactated ringers bolus 1,000 mL  . metoCLOPramide (REGLAN) injection 10 mg    Assessment and Plan  [redacted] weeks gestation Morning sickness Dehydration Discharge home in stable condition.  Restart Bonjesta  Rx Reglan prn for n/v.    Follow up: prenatal appointment with [facility] on Monday.  Return precautions  Allergies as of 04/12/2019      Reactions   Doxycycline Rash   Codeine Nausea And Vomiting   Sucralfate Nausea And Vomiting   Augmentin [amoxicillin-pot Clavulanate]    Pt states she recently took amoxicillin without incident, but cannot take the combination in Augmentin.   Has patient had a PCN reaction causing immediate rash, facial/tongue/throat swelling, SOB or lightheadedness with hypotension: No Has patient had a PCN reaction causing severe rash involving mucus membranes or skin necrosis: No Has patient had a PCN reaction that required hospitalization No Has patient had a PCN reaction occurring within the last 10 years: No If all of the above answers are "NO      Medication List    STOP taking  these medications   ondansetron 4 MG disintegrating tablet Commonly known as: Zofran ODT     TAKE these medications   acetaminophen 500 MG tablet Commonly known as: TYLENOL Take 1,000 mg by mouth every 6 (six) hours as needed for mild pain, moderate pain, fever or headache.    albuterol 108 (90 Base) MCG/ACT inhaler Commonly known as: VENTOLIN HFA Inhale 2 puffs into the lungs every 4 (four) hours as needed for wheezing or shortness of breath.   Bonjesta 20-20 MG Tbcr Generic drug: Doxylamine-Pyridoxine ER Take 1 tablet by mouth at bedtime. Can add 1 tablet in the morning if needed for nausea and vomiting   Flinstones Gummies Omega-3 DHA Chew Chew by mouth. Takes 2 daily   metoCLOPramide 10 MG tablet Commonly known as: Reglan Take 1 tablet (10 mg total) by mouth every 6 (six) hours as needed for nausea or vomiting.      Avelina Laine 04/12/2019, 2:57 PM   I confirm that I have verified the information documented in the physician assistant student's note and that I have also personally reperformed the history, physical exam and all medical decision making activities of this service and have verified that all service and findings are accurately documented in this student's note.   Donette Larry, CNM 04/12/2019 3:27 PM

## 2019-04-12 NOTE — MAU Note (Signed)
Unable to void at this time. Has been dizzy.  Actively vomiting.

## 2019-04-12 NOTE — Discharge Instructions (Signed)
Morning Sickness ° °Morning sickness is when you feel sick to your stomach (nauseous) during pregnancy. You may feel sick to your stomach and throw up (vomit). You may feel sick in the morning, but you can feel this way at any time of day. Some women feel very sick to their stomach and cannot stop throwing up (hyperemesis gravidarum). °Follow these instructions at home: °Medicines °· Take over-the-counter and prescription medicines only as told by your doctor. Do not take any medicines until you talk with your doctor about them first. °· Taking multivitamins before getting pregnant can stop or lessen the harshness of morning sickness. °Eating and drinking °· Eat dry toast or crackers before getting out of bed. °· Eat 5 or 6 small meals a day. °· Eat dry and bland foods like rice and baked potatoes. °· Do not eat greasy, fatty, or spicy foods. °· Have someone cook for you if the smell of food causes you to feel sick or throw up. °· If you feel sick to your stomach after taking prenatal vitamins, take them at night or with a snack. °· Eat protein when you need a snack. Nuts, yogurt, and cheese are good choices. °· Drink fluids throughout the day. °· Try ginger ale made with real ginger, ginger tea made from fresh grated ginger, or ginger candies. °General instructions °· Do not use any products that have nicotine or tobacco in them, such as cigarettes and e-cigarettes. If you need help quitting, ask your doctor. °· Use an air purifier to keep the air in your house free of smells. °· Get lots of fresh air. °· Try to avoid smells that make you feel sick. °· Try: °? Wearing a bracelet that is used for seasickness (acupressure wristband). °? Going to a doctor who puts thin needles into certain body points (acupuncture) to improve how you feel. °Contact a doctor if: °· You need medicine to feel better. °· You feel dizzy or light-headed. °· You are losing weight. °Get help right away if: °· You feel very sick to your  stomach and cannot stop throwing up. °· You pass out (faint). °· You have very bad pain in your belly. °Summary °· Morning sickness is when you feel sick to your stomach (nauseous) during pregnancy. °· You may feel sick in the morning, but you can feel this way at any time of day. °· Making some changes to what you eat may help your symptoms go away. °This information is not intended to replace advice given to you by your health care provider. Make sure you discuss any questions you have with your health care provider. °Document Revised: 02/18/2017 Document Reviewed: 04/08/2016 °Elsevier Patient Education © 2020 [employer] ° °

## 2019-04-12 NOTE — MAU Note (Signed)
Ongoing vomiting.  zofran is not working, last taken 0800.

## 2019-04-13 ENCOUNTER — Other Ambulatory Visit: Payer: Self-pay

## 2019-04-13 ENCOUNTER — Telehealth: Payer: Self-pay | Admitting: Obstetrics & Gynecology

## 2019-04-13 ENCOUNTER — Telehealth: Payer: Self-pay | Admitting: *Deleted

## 2019-04-13 ENCOUNTER — Ambulatory Visit (INDEPENDENT_AMBULATORY_CARE_PROVIDER_SITE_OTHER): Admitting: *Deleted

## 2019-04-13 ENCOUNTER — Encounter: Payer: Self-pay | Admitting: *Deleted

## 2019-04-13 DIAGNOSIS — H8120 Vestibular neuronitis, unspecified ear: Secondary | ICD-10-CM | POA: Diagnosis not present

## 2019-04-13 MED ORDER — PREDNISONE 10 MG PO TABS: ORAL_TABLET | ORAL | 0 refills | Status: DC

## 2019-04-13 MED ORDER — MECLIZINE HCL 25 MG PO TABS: 25.0000 mg | ORAL_TABLET | Freq: Three times a day (TID) | ORAL | 0 refills | Status: DC | PRN

## 2019-04-13 MED ORDER — BETAMETHASONE SOD PHOS & ACET 6 (3-3) MG/ML IJ SUSP
12.0000 mg | Freq: Once | INTRAMUSCULAR | Status: AC
  Administered 2019-04-13: 12 mg via INTRAMUSCULAR

## 2019-04-13 MED ORDER — DIAZEPAM 5 MG PO TABS: ORAL_TABLET | ORAL | 0 refills | Status: DC

## 2019-04-13 NOTE — Progress Notes (Signed)
   NURSE VISIT- INJECTION  SUBJECTIVE:  [name] is a 39 y.o. G7P2002 female here for a Betamethasone for per provider order for treatment of vestibular neuritis.. She is [redacted]w[redacted]d pregnant.   OBJECTIVE:  LMP 01/04/2019 (Exact Date)   Appears well, in no apparent distress  Injection administered in: Right upper quad. gluteus  Meds ordered this encounter  Medications  . betamethasone acetate-betamethasone sodium phosphate (CELESTONE) injection 12 mg    ASSESSMENT: Pregnancy [redacted]w[redacted]d Betamethasone for per provider order for treatment of vestibular neuritis.  PLAN: Follow-up: as scheduled   Jobe Marker  04/13/2019 11:39 AM

## 2019-04-13 NOTE — Telephone Encounter (Signed)
Husband called very concerned about his wife as she has been vomiting and experiencing vertigo for the last week.  Vertigo worsens when she stands up in which then triggers her vomiting.  She was seen in our office on 1/20 for evaluation and was prescribed a different antiemetic (Zofran) but did not feel any better.  She ended up going to [facility] yesterday for worsening symptoms and was given fluids and IV Reglan.  Symptoms improved for a short period of time but she has woken up this morning with returned symptoms.    Discussed with Dr Despina Hidden who believes she has vestibular neuritis and needs treatment with Prednisone and Valium.    Discussed with patient and husband in regards to treatment and recommended she come into the office for an injection of BMZ to help boost treatment.  Both in agreeable to plan and will come soon.

## 2019-04-16 ENCOUNTER — Encounter: Payer: Self-pay | Admitting: Advanced Practice Midwife

## 2019-04-16 ENCOUNTER — Ambulatory Visit (INDEPENDENT_AMBULATORY_CARE_PROVIDER_SITE_OTHER): Admitting: Advanced Practice Midwife

## 2019-04-16 ENCOUNTER — Other Ambulatory Visit: Payer: Self-pay

## 2019-04-16 VITALS — BP 120/74 | HR 83 | Wt 135.0 lb

## 2019-04-16 DIAGNOSIS — Z1389 Encounter for screening for other disorder: Secondary | ICD-10-CM

## 2019-04-16 DIAGNOSIS — Z3482 Encounter for supervision of other normal pregnancy, second trimester: Secondary | ICD-10-CM

## 2019-04-16 DIAGNOSIS — Z331 Pregnant state, incidental: Secondary | ICD-10-CM

## 2019-04-16 DIAGNOSIS — Z3A14 14 weeks gestation of pregnancy: Secondary | ICD-10-CM

## 2019-04-16 DIAGNOSIS — Z363 Encounter for antenatal screening for malformations: Secondary | ICD-10-CM

## 2019-04-16 LAB — POCT URINALYSIS DIPSTICK OB
Blood, UA: NEGATIVE
Glucose, UA: NEGATIVE
Ketones, UA: NEGATIVE
Leukocytes, UA: NEGATIVE
Nitrite, UA: NEGATIVE
POC,PROTEIN,UA: NEGATIVE

## 2019-04-16 NOTE — Progress Notes (Signed)
   LOW-RISK PREGNANCY VISIT Patient name: [name] MRN [id number]  Date of birth: Feb 24, 1981 Chief Complaint:   Routine Prenatal Visit  History of Present Illness:   [name] is a 39 y.o. G64P2002 female at [redacted]w[redacted]d with an Estimated Date of Delivery: 10/11/19 being seen today for ongoing management of a low-risk pregnancy.  Today she reports no complaints. Contractions: Not present.  .   . denies leaking of fluid. Steroid tx helped so much w/vertigo/nausea.  Review of Systems:   Pertinent items are noted in HPI Denies abnormal vaginal discharge w/ itching/odor/irritation, headaches, visual changes, shortness of breath, chest pain, abdominal pain, severe nausea/vomiting, or problems with urination or bowel movements unless otherwise stated above. Pertinent History Reviewed:  Reviewed past medical,surgical, social, obstetrical and family history.  Reviewed problem list, medications and allergies. Physical Assessment:   Vitals:   04/16/19 1106  BP: 120/74  Pulse: 83  Weight: 135 lb (61.2 kg)  Body mass index is 25.51 kg/m.        Physical Examination:   General appearance: Well appearing, and in no distress  Mental status: Alert, oriented to person, place, and time  Skin: Warm & dry  Cardiovascular: Normal heart rate noted  Respiratory: Normal respiratory effort, no distress  Abdomen: Soft, gravid, nontender  Pelvic: Cervical exam deferred         Extremities: Edema: None  Fetal Status: Fetal Heart Rate (bpm): 145        Chaperone: n/a    Results for orders placed or performed in visit on 04/16/19 (from the past 24 hour(s))  POC Urinalysis Dipstick OB   Collection Time: 04/16/19 11:19 AM  Result Value Ref Range   Color, UA     Clarity, UA     Glucose, UA Negative Negative   Bilirubin, UA     Ketones, UA neg    Spec Grav, UA     Blood, UA neg    pH, UA     POC,PROTEIN,UA Negative Negative, Trace, Small (1+), Moderate (2+), Large (3+), 4+   Urobilinogen, UA      Nitrite, UA neg    Leukocytes, UA Negative Negative   Appearance     Odor      Assessment & Plan:  1) Low-risk pregnancy G3P2002 at [redacted]w[redacted]d with an Estimated Date of Delivery: 10/11/19   2) gestational carrier,    Meds: No orders of the defined types were placed in this encounter.  Labs/procedures today: none  Plan:  Continue routine obstetrical care  Next visit: prefers will be in person for anatomy scan    Reviewed: Preterm labor symptoms and general obstetric precautions including but not limited to vaginal bleeding, contractions, leaking of fluid and fetal movement were reviewed in detail with the patient.  All questions were answered. Has home bp cuff.  Check bp weekly, let us know if >140/90.   Follow-up: Return in about 4 weeks (around 05/14/2019) for LROB, [id number].  Orders Placed This Encounter  Procedures  . US OB Comp + 14 Wk  . POC Urinalysis Dipstick OB   Jacklyn Shell DNP, CNM 04/16/2019 11:43 AM

## 2019-04-16 NOTE — Patient Instructions (Signed)
[name], I greatly value your feedback.  If you receive a survey following your visit with [place] today, we appreciate you taking the time to fill it out.  Thanks, Cathie Beams, CNM     [facility] HAS MOVED!!! It is now [facility] ([address] [place], [place] [address]) Entrance located off of [address] Free 24/7 valet parking   Go to [email or web address] to register for FREE online childbirth classes    Second Trimester of Pregnancy The second trimester is from week 14 through week 27 (months 4 through 6). The second trimester is often a time when you feel your best. Your body has adjusted to being pregnant, and you begin to feel better physically. Usually, morning sickness has lessened or quit completely, you may have more energy, and you may have an increase in appetite. The second trimester is also a time when the fetus is growing rapidly. At the end of the sixth month, the fetus is about 9 inches long and weighs about 1 pounds. You will likely begin to feel the baby move (quickening) between 16 and 20 weeks of pregnancy. Body changes during your second trimester Your body continues to go through many changes during your second trimester. The changes vary from woman to woman.  Your weight will continue to increase. You will notice your lower abdomen bulging out.  You may begin to get stretch marks on your hips, abdomen, and breasts.  You may develop headaches that can be relieved by medicines. The medicines should be approved by your health care provider.  You may urinate more often because the fetus is pressing on your bladder.  You may develop or continue to have heartburn as a result of your pregnancy.  You may develop constipation because certain hormones are causing the muscles that push waste through your intestines to slow down.  You may develop hemorrhoids or swollen, bulging veins (varicose veins).  You may have  back pain. This is caused by: ? Weight gain. ? Pregnancy hormones that are relaxing the joints in your pelvis. ? A shift in weight and the muscles that support your balance.  Your breasts will continue to grow and they will continue to become tender.  Your gums may bleed and may be sensitive to brushing and flossing.  Dark spots or blotches (chloasma, mask of pregnancy) may develop on your face. This will likely fade after the baby is born.  A dark line from your belly button to the pubic area (linea nigra) may appear. This will likely fade after the baby is born.  You may have changes in your hair. These can include thickening of your hair, rapid growth, and changes in texture. Some women also have hair loss during or after pregnancy, or hair that feels dry or thin. Your hair will most likely return to normal after your baby is born.  What to expect at prenatal visits During a routine prenatal visit:  You will be weighed to make sure you and the fetus are growing normally.  Your blood pressure will be taken.  Your abdomen will be measured to track your baby's growth.  The fetal heartbeat will be listened to.  Any test results from the previous visit will be discussed.  Your health care provider may ask you:  How you are feeling.  If you are feeling the baby move.  If you have had any abnormal symptoms, such as leaking fluid, bleeding, severe headaches, or  abdominal cramping.  If you are using any tobacco products, including cigarettes, chewing tobacco, and electronic cigarettes.  If you have any questions.  Other tests that may be performed during your second trimester include:  Blood tests that check for: ? Low iron levels (anemia). ? High blood sugar that affects pregnant women (gestational diabetes) between 24 and 28 weeks. ? Rh antibodies. This is to check for a protein on red blood cells (Rh factor).  Urine tests to check for infections, diabetes, or protein in  the urine.  An ultrasound to confirm the proper growth and development of the baby.  An amniocentesis to check for possible genetic problems.  Fetal screens for spina bifida and Down syndrome.  HIV (human immunodeficiency virus) testing. Routine prenatal testing includes screening for HIV, unless you choose not to have this test.  Follow these instructions at home: Medicines  Follow your health care provider's instructions regarding medicine use. Specific medicines may be either safe or unsafe to take during pregnancy.  Take a prenatal vitamin that contains at least 600 micrograms (mcg) of folic acid.  If you develop constipation, try taking a stool softener if your health care provider approves. Eating and drinking  Eat a balanced diet that includes fresh fruits and vegetables, whole grains, good sources of protein such as meat, eggs, or tofu, and low-fat dairy. Your health care provider will help you determine the amount of weight gain that is right for you.  Avoid raw meat and uncooked cheese. These carry germs that can cause birth defects in the baby.  If you have low calcium intake from food, talk to your health care provider about whether you should take a daily calcium supplement.  Limit foods that are high in fat and processed sugars, such as fried and sweet foods.  To prevent constipation: ? Drink enough fluid to keep your urine clear or pale yellow. ? Eat foods that are high in fiber, such as fresh fruits and vegetables, whole grains, and beans. Activity  Exercise only as directed by your health care provider. Most women can continue their usual exercise routine during pregnancy. Try to exercise for 30 minutes at least 5 days a week. Stop exercising if you experience uterine contractions.  Avoid heavy lifting, wear low heel shoes, and practice good posture.  A sexual relationship may be continued unless your health care provider directs you otherwise. Relieving pain  and discomfort  Wear a good support bra to prevent discomfort from breast tenderness.  Take warm sitz baths to soothe any pain or discomfort caused by hemorrhoids. Use hemorrhoid cream if your health care provider approves.  Rest with your legs elevated if you have leg cramps or low back pain.  If you develop varicose veins, wear support hose. Elevate your feet for 15 minutes, 3-4 times a day. Limit salt in your diet. Prenatal Care  Write down your questions. Take them to your prenatal visits.  Keep all your prenatal visits as told by your health care provider. This is important. Safety  Wear your seat belt at all times when driving.  Make a list of emergency phone numbers, including numbers for family, friends, the hospital, and police and fire departments. General instructions  Ask your health care provider for a referral to a local prenatal education class. Begin classes no later than the beginning of month 6 of your pregnancy.  Ask for help if you have counseling or nutritional needs during pregnancy. Your health care provider can offer advice or   refer you to specialists for help with various needs.  Do not use hot tubs, steam rooms, or saunas.  Do not douche or use tampons or scented sanitary pads.  Do not cross your legs for long periods of time.  Avoid cat litter boxes and soil used by cats. These carry germs that can cause birth defects in the baby and possibly loss of the fetus by miscarriage or stillbirth.  Avoid all smoking, herbs, alcohol, and unprescribed drugs. Chemicals in these products can affect the formation and growth of the baby.  Do not use any products that contain nicotine or tobacco, such as cigarettes and e-cigarettes. If you need help quitting, ask your health care provider.  Visit your dentist if you have not gone yet during your pregnancy. Use a soft toothbrush to brush your teeth and be gentle when you floss. Contact a health care provider  if:  You have dizziness.  You have mild pelvic cramps, pelvic pressure, or nagging pain in the abdominal area.  You have persistent nausea, vomiting, or diarrhea.  You have a bad smelling vaginal discharge.  You have pain when you urinate. Get help right away if:  You have a fever.  You are leaking fluid from your vagina.  You have spotting or bleeding from your vagina.  You have severe abdominal cramping or pain.  You have rapid weight gain or weight loss.  You have shortness of breath with chest pain.  You notice sudden or extreme swelling of your face, hands, ankles, feet, or legs.  You have not felt your baby move in over an hour.  You have severe headaches that do not go away when you take medicine.  You have vision changes. Summary  The second trimester is from week 14 through week 27 (months 4 through 6). It is also a time when the fetus is growing rapidly.  Your body goes through many changes during pregnancy. The changes vary from woman to woman.  Avoid all smoking, herbs, alcohol, and unprescribed drugs. These chemicals affect the formation and growth your baby.  Do not use any tobacco products, such as cigarettes, chewing tobacco, and e-cigarettes. If you need help quitting, ask your health care provider.  Contact your health care provider if you have any questions. Keep all prenatal visits as told by your health care provider. This is important. This information is not intended to replace advice given to you by your health care provider. Make sure you discuss any questions you have with your health care provider.

## 2019-04-19 ENCOUNTER — Encounter: Payer: Self-pay | Admitting: Advanced Practice Midwife

## 2019-04-23 ENCOUNTER — Telehealth: Payer: Self-pay | Admitting: *Deleted

## 2019-04-23 NOTE — Telephone Encounter (Signed)
Patient left message that she thinks she has found the cause of her dizziness. Would like a cal back to discuss.   Returned patient's call.  States she noticed this weekend that after she drank a strawberry flavored drink and ate a strawberry twizler, that she developed that dizziness and felt how she did before.  She compared the ingredients in them and noted they all contained red dye 40.  Informed patient this could be the cause of how she felt but not for sure.  Advised to try to look at the labels to avoid the red dye 40 and I would add to her list of allergies.  Pt verbalized understanding and all questions answered.

## 2019-05-14 ENCOUNTER — Other Ambulatory Visit: Payer: Self-pay

## 2019-05-14 ENCOUNTER — Ambulatory Visit (INDEPENDENT_AMBULATORY_CARE_PROVIDER_SITE_OTHER)

## 2019-05-14 ENCOUNTER — Ambulatory Visit (INDEPENDENT_AMBULATORY_CARE_PROVIDER_SITE_OTHER): Admitting: Advanced Practice Midwife

## 2019-05-14 VITALS — BP 113/79 | HR 93 | Wt 140.0 lb

## 2019-05-14 DIAGNOSIS — Z1379 Encounter for other screening for genetic and chromosomal anomalies: Secondary | ICD-10-CM

## 2019-05-14 DIAGNOSIS — Z3A18 18 weeks gestation of pregnancy: Secondary | ICD-10-CM

## 2019-05-14 DIAGNOSIS — Z363 Encounter for antenatal screening for malformations: Secondary | ICD-10-CM

## 2019-05-14 DIAGNOSIS — Z3482 Encounter for supervision of other normal pregnancy, second trimester: Secondary | ICD-10-CM

## 2019-05-14 NOTE — Progress Notes (Signed)
[place] 18+4 wks,cephalic,cx 2.8 cm,posterior placenta gr 0,normal ovaries,svp of fluid 4.8 cm,fhr 137 bpm,EFW 251 g 50%,anatomy complete,no obvious abnormalities

## 2019-05-14 NOTE — Patient Instructions (Signed)
[name], I greatly value your feedback.  If you receive a survey following your visit with [place] today, we appreciate you taking the time to fill it out.  Thanks, Cathie Beams, CNM     [facility] HAS MOVED!!! It is now [facility] ([address] [place], [place] [address]) Entrance located off of [address] Free 24/7 valet parking   Go to [email or web address] to register for FREE online childbirth classes    Second Trimester of Pregnancy The second trimester is from week 14 through week 27 (months 4 through 6). The second trimester is often a time when you feel your best. Your body has adjusted to being pregnant, and you begin to feel better physically. Usually, morning sickness has lessened or quit completely, you may have more energy, and you may have an increase in appetite. The second trimester is also a time when the fetus is growing rapidly. At the end of the sixth month, the fetus is about 9 inches long and weighs about 1 pounds. You will likely begin to feel the baby move (quickening) between 16 and 20 weeks of pregnancy. Body changes during your second trimester Your body continues to go through many changes during your second trimester. The changes vary from woman to woman.  Your weight will continue to increase. You will notice your lower abdomen bulging out.  You may begin to get stretch marks on your hips, abdomen, and breasts.  You may develop headaches that can be relieved by medicines. The medicines should be approved by your health care provider.  You may urinate more often because the fetus is pressing on your bladder.  You may develop or continue to have heartburn as a result of your pregnancy.  You may develop constipation because certain hormones are causing the muscles that push waste through your intestines to slow down.  You may develop hemorrhoids or swollen, bulging veins (varicose veins).  You may have  back pain. This is caused by: ? Weight gain. ? Pregnancy hormones that are relaxing the joints in your pelvis. ? A shift in weight and the muscles that support your balance.  Your breasts will continue to grow and they will continue to become tender.  Your gums may bleed and may be sensitive to brushing and flossing.  Dark spots or blotches (chloasma, mask of pregnancy) may develop on your face. This will likely fade after the baby is born.  A dark line from your belly button to the pubic area (linea nigra) may appear. This will likely fade after the baby is born.  You may have changes in your hair. These can include thickening of your hair, rapid growth, and changes in texture. Some women also have hair loss during or after pregnancy, or hair that feels dry or thin. Your hair will most likely return to normal after your baby is born.  What to expect at prenatal visits During a routine prenatal visit:  You will be weighed to make sure you and the fetus are growing normally.  Your blood pressure will be taken.  Your abdomen will be measured to track your baby's growth.  The fetal heartbeat will be listened to.  Any test results from the previous visit will be discussed.  Your health care provider may ask you:  How you are feeling.  If you are feeling the baby move.  If you have had any abnormal symptoms, such as leaking fluid, bleeding, severe headaches, or  abdominal cramping.  If you are using any tobacco products, including cigarettes, chewing tobacco, and electronic cigarettes.  If you have any questions.  Other tests that may be performed during your second trimester include:  Blood tests that check for: ? Low iron levels (anemia). ? High blood sugar that affects pregnant women (gestational diabetes) between 24 and 28 weeks. ? Rh antibodies. This is to check for a protein on red blood cells (Rh factor).  Urine tests to check for infections, diabetes, or protein in  the urine.  An ultrasound to confirm the proper growth and development of the baby.  An amniocentesis to check for possible genetic problems.  Fetal screens for spina bifida and Down syndrome.  HIV (human immunodeficiency virus) testing. Routine prenatal testing includes screening for HIV, unless you choose not to have this test.  Follow these instructions at home: Medicines  Follow your health care provider's instructions regarding medicine use. Specific medicines may be either safe or unsafe to take during pregnancy.  Take a prenatal vitamin that contains at least 600 micrograms (mcg) of folic acid.  If you develop constipation, try taking a stool softener if your health care provider approves. Eating and drinking  Eat a balanced diet that includes fresh fruits and vegetables, whole grains, good sources of protein such as meat, eggs, or tofu, and low-fat dairy. Your health care provider will help you determine the amount of weight gain that is right for you.  Avoid raw meat and uncooked cheese. These carry germs that can cause birth defects in the baby.  If you have low calcium intake from food, talk to your health care provider about whether you should take a daily calcium supplement.  Limit foods that are high in fat and processed sugars, such as fried and sweet foods.  To prevent constipation: ? Drink enough fluid to keep your urine clear or pale yellow. ? Eat foods that are high in fiber, such as fresh fruits and vegetables, whole grains, and beans. Activity  Exercise only as directed by your health care provider. Most women can continue their usual exercise routine during pregnancy. Try to exercise for 30 minutes at least 5 days a week. Stop exercising if you experience uterine contractions.  Avoid heavy lifting, wear low heel shoes, and practice good posture.  A sexual relationship may be continued unless your health care provider directs you otherwise. Relieving pain  and discomfort  Wear a good support bra to prevent discomfort from breast tenderness.  Take warm sitz baths to soothe any pain or discomfort caused by hemorrhoids. Use hemorrhoid cream if your health care provider approves.  Rest with your legs elevated if you have leg cramps or low back pain.  If you develop varicose veins, wear support hose. Elevate your feet for 15 minutes, 3-4 times a day. Limit salt in your diet. Prenatal Care  Write down your questions. Take them to your prenatal visits.  Keep all your prenatal visits as told by your health care provider. This is important. Safety  Wear your seat belt at all times when driving.  Make a list of emergency phone numbers, including numbers for family, friends, the hospital, and police and fire departments. General instructions  Ask your health care provider for a referral to a local prenatal education class. Begin classes no later than the beginning of month 6 of your pregnancy.  Ask for help if you have counseling or nutritional needs during pregnancy. Your health care provider can offer advice or   refer you to specialists for help with various needs.  Do not use hot tubs, steam rooms, or saunas.  Do not douche or use tampons or scented sanitary pads.  Do not cross your legs for long periods of time.  Avoid cat litter boxes and soil used by cats. These carry germs that can cause birth defects in the baby and possibly loss of the fetus by miscarriage or stillbirth.  Avoid all smoking, herbs, alcohol, and unprescribed drugs. Chemicals in these products can affect the formation and growth of the baby.  Do not use any products that contain nicotine or tobacco, such as cigarettes and e-cigarettes. If you need help quitting, ask your health care provider.  Visit your dentist if you have not gone yet during your pregnancy. Use a soft toothbrush to brush your teeth and be gentle when you floss. Contact a health care provider  if:  You have dizziness.  You have mild pelvic cramps, pelvic pressure, or nagging pain in the abdominal area.  You have persistent nausea, vomiting, or diarrhea.  You have a bad smelling vaginal discharge.  You have pain when you urinate. Get help right away if:  You have a fever.  You are leaking fluid from your vagina.  You have spotting or bleeding from your vagina.  You have severe abdominal cramping or pain.  You have rapid weight gain or weight loss.  You have shortness of breath with chest pain.  You notice sudden or extreme swelling of your face, hands, ankles, feet, or legs.  You have not felt your baby move in over an hour.  You have severe headaches that do not go away when you take medicine.  You have vision changes. Summary  The second trimester is from week 14 through week 27 (months 4 through 6). It is also a time when the fetus is growing rapidly.  Your body goes through many changes during pregnancy. The changes vary from woman to woman.  Avoid all smoking, herbs, alcohol, and unprescribed drugs. These chemicals affect the formation and growth your baby.  Do not use any tobacco products, such as cigarettes, chewing tobacco, and e-cigarettes. If you need help quitting, ask your health care provider.  Contact your health care provider if you have any questions. Keep all prenatal visits as told by your health care provider. This is important. This information is not intended to replace advice given to you by your health care provider. Make sure you discuss any questions you have with your health care provider.         

## 2019-05-14 NOTE — Progress Notes (Signed)
   LOW-RISK PREGNANCY VISIT Patient name: [name] MRN [id number]  Date of birth: 07/02/1980 Chief Complaint:   Routine Prenatal Visit ([place])  History of Present Illness:   [name] is a 39 y.o. G5P2002 female at [redacted]w[redacted]d with an Estimated Date of Delivery: 10/11/19 being seen today for ongoing management of a low-risk pregnancy.  Today she reports no complaints. Contractions: Not present. Vag. Bleeding: None.  Movement: Present. denies leaking of fluid. Review of Systems:   Pertinent items are noted in HPI Denies abnormal vaginal discharge w/ itching/odor/irritation, headaches, visual changes, shortness of breath, chest pain, abdominal pain, severe nausea/vomiting, or problems with urination or bowel movements unless otherwise stated above. Pertinent History Reviewed:  Reviewed past medical,surgical, social, obstetrical and family history.  Reviewed problem list, medications and allergies. Physical Assessment:   Vitals:   05/14/19 1104  BP: 113/79  Pulse: 93  Weight: 140 lb (63.5 kg)  Body mass index is 26.45 kg/m.        Physical Examination:   General appearance: Well appearing, and in no distress  Mental status: Alert, oriented to person, place, and time  Skin: Warm & dry  Cardiovascular: Normal heart rate noted  Respiratory: Normal respiratory effort, no distress  Abdomen: Soft, gravid, nontender  Pelvic: Cervical exam deferred         Extremities: Edema: None  Fetal Status:     Movement: Present   [place] 18+4 wks,cephalic,cx 2.8 cm,posterior placenta gr 0,normal ovaries,svp of fluid 4.8 cm,fhr 137 bpm,EFW 251 g 50%,anatomy complete,no obvious abnormalities  Chaperone: n/a    No results found for this or any previous visit (from the past 24 hour(s)).  Assessment & Plan:  1) Low-risk pregnancy G3P2002 at [redacted]w[redacted]d with an Estimated Date of Delivery: 10/11/19   2) gestational carrier,    Meds: No orders of the defined types were placed in this  encounter.  Labs/procedures today: 2nd IT  Plan:  Continue routine obstetrical care  Next visit: prefers online    Reviewed: Preterm labor symptoms and general obstetric precautions including but not limited to vaginal bleeding, contractions, leaking of fluid and fetal movement were reviewed in detail with the patient.  All questions were answered. Has home bp cuff.. Check bp weekly, let us know if >140/90.   Follow-up: Return in about 4 weeks (around 06/11/2019) [facility] w[facility]Dr. Despina Hidden.  Orders Placed This Encounter  Procedures  . INTEGRATED 2   Jacklyn Shell DNP, CNM 05/14/2019 11:57 AM

## 2019-05-16 LAB — INTEGRATED 2
AFP MoM: 1.11
Alpha-Fetoprotein: 52.6 ng/mL
Crown Rump Length: 57.1 mm
DIA MoM: 1.05
DIA Value: 189.6 pg/mL
Estriol, Unconjugated: 1.33 ng/mL
Gest. Age on Collection Date: 12.1 weeks
Gestational Age: 18.6 weeks
Maternal Age at EDD: 40.2 yr
Nuchal Translucency (NT): 1.3 mm
Nuchal Translucency MoM: 1.02
Number of Fetuses: 1
PAPP-A MoM: 1.77
PAPP-A Value: 1639.5 ng/mL
Test Results:: NEGATIVE
Weight: 134 [lb_av]
Weight: 134 [lb_av]
hCG MoM: 2.12
hCG Value: 53.8 IU/mL
uE3 MoM: 0.79

## 2019-06-11 ENCOUNTER — Encounter: Payer: Self-pay | Admitting: Obstetrics & Gynecology

## 2019-06-11 ENCOUNTER — Other Ambulatory Visit: Payer: Self-pay

## 2019-06-11 ENCOUNTER — Telehealth (INDEPENDENT_AMBULATORY_CARE_PROVIDER_SITE_OTHER): Admitting: Obstetrics & Gynecology

## 2019-06-11 VITALS — BP 106/88

## 2019-06-11 DIAGNOSIS — Z3482 Encounter for supervision of other normal pregnancy, second trimester: Secondary | ICD-10-CM

## 2019-06-11 NOTE — Progress Notes (Addendum)
I connected with@ on 06/11/19 at  9:30 AM EDT by: MyChart connect2Tex and [employer] and verified that I am speaking with the correct person using two identifiers.  Patient is located at office and provider is located at office.     The purpose of this virtual visit is to provide medical care while limiting exposure to the novel coronavirus. I discussed the limitations, risks, security and privacy concerns of performing an evaluation and management service by MyChart connect and the availability of in person appointments. I also discussed with the patient that there may be a patient responsible charge related to this service. By engaging in this virtual visit, you consent to the provision of healthcare.  Additionally, you authorize for your insurance to be billed for the services provided during this visit.  The patient expressed understanding and agreed to proceed.  The following staff members participated in the virtual visit:  [place]    PRENATAL VISIT NOTE  Subjective:  [name] is a 39 y.o. G3P2002 at [redacted]w[redacted]d  for phone visit for ongoing prenatal care.  She is currently monitored for the following issues for this low-risk pregnancy and has ASTHMA; GERD; DYSPHAGIA UNSPECIFIED; Anxiety and depression; Bing-Horton syndrome; EE (eosinophilic esophagitis); Telangiectasia of skin; Mild intermittent asthma; Family history of colon cancer in father; Pregnant state, gestational carrier; Supervision of normal pregnancy; and LGSIL on Pap smear of cervix on their problem list.  Patient reports no complaints.  Contractions: Not present. Vag. Bleeding: None.  Movement: Present. Denies leaking of fluid.   The following portions of the patient's history were reviewed and updated as appropriate: allergies, current medications, past family history, past medical history, past social history, past surgical history and problem list.   Objective:   Vitals:   06/11/19 0916  BP: 106/88    Self-Obtained  Fetal Status:     Movement: Present     Assessment and Plan:  Pregnancy: G3P2002 at [redacted]w[redacted]d There are no diagnoses linked to this encounter. Preterm labor symptoms and general obstetric precautions including but not limited to vaginal bleeding, contractions, leaking of fluid and fetal movement were reviewed in detail with the patient.  No follow-ups on file.  No future appointments.   Time spent on virtual visit: 15 minutes  [name], MD   This was a video visit, it is documented in the first line above as a Statistician visit, which is video  [name], MD 09/04/2019 6:54 AM

## 2019-07-09 ENCOUNTER — Other Ambulatory Visit: Payer: Self-pay

## 2019-07-09 ENCOUNTER — Ambulatory Visit (INDEPENDENT_AMBULATORY_CARE_PROVIDER_SITE_OTHER): Admitting: Women's Health

## 2019-07-09 ENCOUNTER — Encounter: Payer: Self-pay | Admitting: Women's Health

## 2019-07-09 ENCOUNTER — Other Ambulatory Visit

## 2019-07-09 VITALS — BP 108/74 | HR 90 | Wt 150.6 lb

## 2019-07-09 DIAGNOSIS — Z331 Pregnant state, incidental: Secondary | ICD-10-CM

## 2019-07-09 DIAGNOSIS — O09529 Supervision of elderly multigravida, unspecified trimester: Secondary | ICD-10-CM | POA: Insufficient documentation

## 2019-07-09 DIAGNOSIS — Z23 Encounter for immunization: Secondary | ICD-10-CM

## 2019-07-09 DIAGNOSIS — O0990 Supervision of high risk pregnancy, unspecified, unspecified trimester: Secondary | ICD-10-CM

## 2019-07-09 DIAGNOSIS — Z1389 Encounter for screening for other disorder: Secondary | ICD-10-CM

## 2019-07-09 DIAGNOSIS — Z131 Encounter for screening for diabetes mellitus: Secondary | ICD-10-CM

## 2019-07-09 DIAGNOSIS — O0992 Supervision of high risk pregnancy, unspecified, second trimester: Secondary | ICD-10-CM

## 2019-07-09 DIAGNOSIS — O09522 Supervision of elderly multigravida, second trimester: Secondary | ICD-10-CM

## 2019-07-09 DIAGNOSIS — Z98891 History of uterine scar from previous surgery: Secondary | ICD-10-CM

## 2019-07-09 DIAGNOSIS — Z333 Pregnant state, gestational carrier: Secondary | ICD-10-CM

## 2019-07-09 DIAGNOSIS — Z3482 Encounter for supervision of other normal pregnancy, second trimester: Secondary | ICD-10-CM

## 2019-07-09 DIAGNOSIS — Z3A26 26 weeks gestation of pregnancy: Secondary | ICD-10-CM

## 2019-07-09 DIAGNOSIS — O34219 Maternal care for unspecified type scar from previous cesarean delivery: Secondary | ICD-10-CM

## 2019-07-09 LAB — POCT URINALYSIS DIPSTICK OB
Blood, UA: NEGATIVE
Glucose, UA: NEGATIVE
Ketones, UA: NEGATIVE
Leukocytes, UA: NEGATIVE
Nitrite, UA: NEGATIVE
POC,PROTEIN,UA: NEGATIVE

## 2019-07-09 NOTE — Addendum Note (Signed)
Addended by: Moss Mc on: 07/09/2019 11:54 AM   Modules accepted: Orders

## 2019-07-09 NOTE — Patient Instructions (Signed)
[name], I greatly value your feedback.  If you receive a survey following your visit with [place] today, we appreciate you taking the time to fill it out.  Thanks, Joellyn Haff, CNM, WHNP-BC   [facility] ([address] [place], [place] [address]) Entrance C, located off of [address] valet parking  Go to [email or web address] to register for FREE online childbirth classes   Call the office [phone]) or go to [facility] if:  You begin to have strong, frequent contractions  Your water breaks.  Sometimes it is a big gush of fluid, sometimes it is just a trickle that keeps getting your panties wet or running down your legs  You have vaginal bleeding.  It is normal to have a small amount of spotting if your cervix was checked.   You don't feel your baby moving like normal.  If you don't, get you something to eat and drink and lay down and focus on feeling your baby move.  You should feel at least 10 movements in 2 hours.  If you don't, you should call the office or go to [facility].    Tdap Vaccine  It is recommended that you get the Tdap vaccine during the third trimester of EACH pregnancy to help protect your baby from getting pertussis (whooping cough)  27-36 weeks is the BEST time to do this so that you can pass the protection on to your baby. During pregnancy is better than after pregnancy, but if you are unable to get it during pregnancy it will be offered at the hospital.   You can get this vaccine with [place], at the health department, your family doctor, or some local pharmacies  Everyone who will be around your baby should also be up-to-date on their vaccines before the baby comes. Adults (who are not pregnant) only need 1 dose of Tdap during adulthood.   [place] Pediatricians/Family Doctors:  [facility] [phone]            [facility] [phone]                 [facility]  [phone] (usually not accepting new patients unless you have family there already, you are always welcome to call and ask)       [facility] Department [phone]       [facility] Pediatricians/Family Doctors:   [facility]: [phone]  [facility]: [phone]  [facility]: [phone]  [facility] Doctors:   [facility]: [phone]   [facility]: [phone]  [facility] Doctors:  [facility]: [phone]   Home Blood Pressure Monitoring for Patients   Your provider has recommended that you check your blood pressure (BP) at least once a week at home. If you do not have a blood pressure cuff at home, one will be provided for you. Contact your provider if you have not received your monitor within 1 week.   Helpful Tips for Accurate Home Blood Pressure Checks  . Don't smoke, exercise, or drink caffeine 30 minutes before checking your BP . Use the restroom before checking your BP (a full bladder can raise your pressure) . Relax in a comfortable upright chair . Feet on the ground . Left arm resting comfortably on a flat surface at the level of your heart . Legs uncrossed . Back supported . Sit quietly and don't talk . Place the cuff on your  bare arm . Adjust snuggly, so that only two fingertips can fit between your skin and the top of the cuff . Check 2 readings separated by at least one minute . Keep a log of your BP readings . For a visual, please reference this diagram: http://ccnc.care/bpdiagram  Provider Name: [facility]     Phone: [phone]  Zone 1: ALL CLEAR  Continue to monitor your symptoms:  . BP reading is less than 140 (top number) or less than 90 (bottom number)  . No right upper stomach pain . No headaches or seeing spots . No feeling nauseated or throwing up . No swelling in face and hands  Zone 2: CAUTION Call your  doctor's office for any of the following:  . BP reading is greater than 140 (top number) or greater than 90 (bottom number)  . Stomach pain under your ribs in the middle or right side . Headaches or seeing spots . Feeling nauseated or throwing up . Swelling in face and hands  Zone 3: EMERGENCY  Seek immediate medical care if you have any of the following:  . BP reading is greater than160 (top number) or greater than 110 (bottom number) . Severe headaches not improving with Tylenol . Serious difficulty catching your breath . Any worsening symptoms from Zone 2   Third Trimester of Pregnancy The third trimester is from week 29 through week 42, months 7 through 9. The third trimester is a time when the fetus is growing rapidly. At the end of the ninth month, the fetus is about 20 inches in length and weighs 6-10 pounds.  BODY CHANGES Your body goes through many changes during pregnancy. The changes vary from woman to woman.   Your weight will continue to increase. You can expect to gain 25-35 pounds (11-16 kg) by the end of the pregnancy.  You may begin to get stretch marks on your hips, abdomen, and breasts.  You may urinate more often because the fetus is moving lower into your pelvis and pressing on your bladder.  You may develop or continue to have heartburn as a result of your pregnancy.  You may develop constipation because certain hormones are causing the muscles that push waste through your intestines to slow down.  You may develop hemorrhoids or swollen, bulging veins (varicose veins).  You may have pelvic pain because of the weight gain and pregnancy hormones relaxing your joints between the bones in your pelvis. Backaches may result from overexertion of the muscles supporting your posture.  You may have changes in your hair. These can include thickening of your hair, rapid growth, and changes in texture. Some women also have hair loss during or after pregnancy, or hair that  feels dry or thin. Your hair will most likely return to normal after your baby is born.  Your breasts will continue to grow and be tender. A yellow discharge may leak from your breasts called colostrum.  Your belly button may stick out.  You may feel short of breath because of your expanding uterus.  You may notice the fetus "dropping," or moving lower in your abdomen.  You may have a bloody mucus discharge. This usually occurs a few days to a week before labor begins.  Your cervix becomes thin and soft (effaced) near your due date. WHAT TO EXPECT AT YOUR PRENATAL EXAMS  You will have prenatal exams every 2 weeks until week 36. Then, you will have weekly prenatal exams. During a routine prenatal visit:  You will be weighed to make sure you and the fetus are growing normally.  Your blood pressure is taken.  Your abdomen will be measured to track your baby's growth.  The fetal heartbeat will be listened to.  Any test results from the previous visit will be discussed.  You may have a cervical check near your due date to see if you have effaced. At around 36 weeks, your caregiver will check your cervix. At the same time, your caregiver will also perform a test on the secretions of the vaginal tissue. This test is to determine if a type of bacteria, Group B streptococcus, is present. Your caregiver will explain this further. Your caregiver may ask you:  What your birth plan is.  How you are feeling.  If you are feeling the baby move.  If you have had any abnormal symptoms, such as leaking fluid, bleeding, severe headaches, or abdominal cramping.  If you have any questions. Other tests or screenings that may be performed during your third trimester include:  Blood tests that check for low iron levels (anemia).  Fetal testing to check the health, activity level, and growth of the fetus. Testing is done if you have certain medical conditions or if there are problems during the  pregnancy. FALSE LABOR You may feel small, irregular contractions that eventually go away. These are called Braxton Hicks contractions, or false labor. Contractions may last for hours, days, or even weeks before true labor sets in. If contractions come at regular intervals, intensify, or become painful, it is best to be seen by your caregiver.  SIGNS OF LABOR   Menstrual-like cramps.  Contractions that are 5 minutes apart or less.  Contractions that start on the top of the uterus and spread down to the lower abdomen and back.  A sense of increased pelvic pressure or back pain.  A watery or bloody mucus discharge that comes from the vagina. If you have any of these signs before the 37th week of pregnancy, call your caregiver right away. You need to go to the hospital to get checked immediately. HOME CARE INSTRUCTIONS   Avoid all smoking, herbs, alcohol, and unprescribed drugs. These chemicals affect the formation and growth of the baby.  Follow your caregiver's instructions regarding medicine use. There are medicines that are either safe or unsafe to take during pregnancy.  Exercise only as directed by your caregiver. Experiencing uterine cramps is a good sign to stop exercising.  Continue to eat regular, healthy meals.  Wear a good support bra for breast tenderness.  Do not use hot tubs, steam rooms, or saunas.  Wear your seat belt at all times when driving.  Avoid raw meat, uncooked cheese, cat litter boxes, and soil used by cats. These carry germs that can cause birth defects in the baby.  Take your prenatal vitamins.  Try taking a stool softener (if your caregiver approves) if you develop constipation. Eat more high-fiber foods, such as fresh vegetables or fruit and whole grains. Drink plenty of fluids to keep your urine clear or pale yellow.  Take warm sitz baths to soothe any pain or discomfort caused by hemorrhoids. Use hemorrhoid cream if your caregiver approves.  If you  develop varicose veins, wear support hose. Elevate your feet for 15 minutes, 3-4 times a day. Limit salt in your diet.  Avoid heavy lifting, wear low heal shoes, and practice good posture.  Rest a lot with your legs elevated if you have leg cramps or low  back pain.  Visit your dentist if you have not gone during your pregnancy. Use a soft toothbrush to brush your teeth and be gentle when you floss.  A sexual relationship may be continued unless your caregiver directs you otherwise.  Do not travel far distances unless it is absolutely necessary and only with the approval of your caregiver.  Take prenatal classes to understand, practice, and ask questions about the labor and delivery.  Make a trial run to the hospital.  Pack your hospital bag.  Prepare the baby's nursery.  Continue to go to all your prenatal visits as directed by your caregiver. SEEK MEDICAL CARE IF:  You are unsure if you are in labor or if your water has broken.  You have dizziness.  You have mild pelvic cramps, pelvic pressure, or nagging pain in your abdominal area.  You have persistent nausea, vomiting, or diarrhea.  You have a bad smelling vaginal discharge.  You have pain with urination. SEEK IMMEDIATE MEDICAL CARE IF:   You have a fever.  You are leaking fluid from your vagina.  You have spotting or bleeding from your vagina.  You have severe abdominal cramping or pain.  You have rapid weight loss or gain.  You have shortness of breath with chest pain.  You notice sudden or extreme swelling of your face, hands, ankles, feet, or legs.  You have not felt your baby move in over an hour.  You have severe headaches that do not go away with medicine.  You have vision changes. Document Released: 03/02/2001 Document Revised: 03/13/2013 Document Reviewed: 05/09/2012 [facility] Patient Information 2015 [place], [place]. This information is not intended to replace advice given to you by your health  care provider. Make sure you discuss any questions you have with your health care provider.  PROTECT YOURSELF & YOUR BABY FROM THE FLU! Because you are pregnant, we at [facility], along with the Centers for Disease Control (CDC), recommend that you receive the flu vaccine to protect yourself and your baby from the flu. The flu is more likely to cause severe illness in pregnant women than in women of reproductive age who are not pregnant. Changes in the immune system, heart, and lungs during pregnancy make pregnant women (and women up to two weeks postpartum) more prone to severe illness from flu, including illness resulting in hospitalization. Flu also may be harmful for a pregnant woman's developing baby. A common flu symptom is fever, which may be associated with neural tube defects and other adverse outcomes for a developing baby. Getting vaccinated can also help protect a baby after birth from flu. (Mom passes antibodies onto the developing baby during her pregnancy.)  A Flu Vaccine is the Best Protection Against Flu Getting a flu vaccine is the first and most important step in protecting against flu. Pregnant women should get a flu shot and not the live attenuated influenza vaccine (LAIV), also known as nasal spray flu vaccine. Flu vaccines given during pregnancy help protect both the mother and her baby from flu. Vaccination has been shown to reduce the risk of flu-associated acute respiratory infection in pregnant women by up to one-half. A 2018 study showed that getting a flu shot reduced a pregnant woman's risk of being hospitalized with flu by an average of 40 percent. Pregnant women who get a flu vaccine are also helping to protect their babies from flu illness for the first several months after their birth, when they are too young to get vaccinated.  A Long Record of Safety for Flu Shots in Pregnant Women Flu shots have been given to millions of pregnant women over many years with a good  safety record. There is a lot of evidence that flu vaccines can be given safely during pregnancy; though these data are limited for the first trimester. The CDC recommends that pregnant women get vaccinated during any trimester of their pregnancy. It is very important for pregnant women to get the flu shot.   Other Preventive Actions In addition to getting a flu shot, pregnant women should take the same everyday preventive actions the CDC recommends of everyone, including covering coughs, washing hands often, and avoiding people who are sick.  Symptoms and Treatment If you get sick with flu symptoms call your doctor right away. There are antiviral drugs that can treat flu illness and prevent serious flu complications. The CDC recommends prompt treatment for people who have influenza infection or suspected influenza infection and who are at high risk of serious flu complications, such as people with asthma, diabetes (including gestational diabetes), or heart disease. Early treatment of influenza in hospitalized pregnant women has been shown to reduce the length of the hospital stay.  Symptoms Flu symptoms include fever, cough, sore throat, runny or stuffy nose, body aches, headache, chills and fatigue. Some people may also have vomiting and diarrhea. People may be infected with the flu and have respiratory symptoms without a fever.  Early Treatment is Important for Pregnant Women Treatment should begin as soon as possible because antiviral drugs work best when started early (within 48 hours after symptoms start). Antiviral drugs can make your flu illness milder and make you feel better faster. They may also prevent serious health problems that can result from flu illness. Oral oseltamivir (Tamiflu) is the preferred treatment for pregnant women because it has the most studies available to suggest that it is safe and beneficial. Antiviral drugs require a prescription from your provider. Having a fever  caused by flu infection or other infections early in pregnancy may be linked to birth defects in a baby. In addition to taking antiviral drugs, pregnant women who get a fever should treat their fever with Tylenol (acetaminophen) and contact their provider immediately.  When to [place] If you are pregnant and have any of these signs, seek care immediately:  Difficulty breathing or shortness of breath  Pain or pressure in the chest or abdomen  Sudden dizziness  Confusion  Severe or persistent vomiting  High fever that is not responding to Tylenol (or store brand equivalent)  Decreased or no movement of your baby  [email or web address]

## 2019-07-09 NOTE — Progress Notes (Signed)
HIGH-RISK PREGNANCY VISIT Patient name: [name] MRN [id number]  Date of birth: 10/16/1980 Chief Complaint:   Routine Prenatal Visit (PN2)  History of Present Illness:   [name] is a 39 y.o. G5P2002 female at [redacted]w[redacted]d with an Estimated Date of Delivery: 10/11/19 being seen today for ongoing management of a high-risk pregnancy complicated by advanced maternal age-turns 39yo next month, gestational carrier.  Today she reports some occ tightening of abdomen.  Depression screen [facility] 2/9 07/09/2019 03/19/2019 06/01/2018 07/04/2015 06/29/2015  Decreased Interest 0 0 0 0 0  Down, Depressed, Hopeless 0 0 0 0 0  PHQ - 2 Score 0 0 0 0 0  Altered sleeping 0 0 0 - -  Tired, decreased energy 0 0 0 - -  Change in appetite 0 0 0 - -  Feeling bad or failure about yourself  0 0 0 - -  Trouble concentrating 0 0 0 - -  Moving slowly or fidgety/restless 0 0 0 - -  Suicidal thoughts 0 0 0 - -  PHQ-9 Score 0 0 0 - -  Difficult doing work/chores Not difficult at all - - - -    Contractions: Not present. Vag. Bleeding: None.  Movement: Present. denies leaking of fluid.  Review of Systems:   Pertinent items are noted in HPI Denies abnormal vaginal discharge w/ itching/odor/irritation, headaches, visual changes, shortness of breath, chest pain, abdominal pain, severe nausea/vomiting, or problems with urination or bowel movements unless otherwise stated above. Pertinent History Reviewed:  Reviewed past medical,surgical, social, obstetrical and family history.  Reviewed problem list, medications and allergies. Physical Assessment:   Vitals:   07/09/19 0928  BP: 108/74  Pulse: 90  Weight: 150 lb 9.6 oz (68.3 kg)  Body mass index is 28.46 kg/m.           Physical Examination:   General appearance: alert, well appearing, and in no distress  Mental status: alert, oriented to person, place, and time  Skin: warm & dry   Extremities: Edema: Trace    Cardiovascular: normal heart rate  noted  Respiratory: normal respiratory effort, no distress  Abdomen: gravid, soft, non-tender  Pelvic: Cervical exam deferred         Fetal Status: Fetal Heart Rate (bpm): 145 Fundal Height: 28 cm Movement: Present    Fetal Surveillance Testing today: doppler   Chaperone: n/a    Results for orders placed or performed in visit on 07/09/19 (from the past 24 hour(s))  POC Urinalysis Dipstick OB   Collection Time: 07/09/19  9:29 AM  Result Value Ref Range   Color, UA     Clarity, UA     Glucose, UA Negative Negative   Bilirubin, UA     Ketones, UA neg    Spec Grav, UA     Blood, UA neg    pH, UA     POC,PROTEIN,UA Negative Negative, Trace, Small (1+), Moderate (2+), Large (3+), 4+   Urobilinogen, UA     Nitrite, UA neg    Leukocytes, UA Negative Negative   Appearance     Odor      Assessment & Plan:  1) High-risk pregnancy G3P2002 at [redacted]w[redacted]d with an Estimated Date of Delivery: 10/11/19   2) AMA, turns 39yo next month  3) Gestational carrier, talks w/ 2 fathers daily, they want her to breastfeed/pump and ship milk to [place], they plan to come for birth  4) Prev c/s x 2> plans repeat  Meds: No orders of  the defined types were placed in this encounter.   Labs/procedures today: pn2, tdap  Treatment Plan:  U/S @ 20, 24, 28, 32, 36wks       2x/wk testing nst/sono @ 36wks        Deliver @ 39wks:____   Reviewed: Preterm labor symptoms and general obstetric precautions including but not limited to vaginal bleeding, contractions, leaking of fluid and fetal movement were reviewed in detail with the patient.  All questions were answered.   Follow-up: Return in about 4 weeks (around 08/06/2019) for [place], US:EFW, in person, MD or CNM.  Orders Placed This Encounter  Procedures  . US OB Follow Up  . Glucose Tolerance, 2 Hours w/1 Hour  . CBC  . HIV Antibody (routine testing w rflx)  . RPR  . POC Urinalysis Dipstick OB  . Antibody screen   Roma Schanz CNM,  [facility] 07/09/2019 10:19 AM

## 2019-07-10 LAB — GLUCOSE TOLERANCE, 2 HOURS W/ 1HR
Glucose, 1 hour: 114 mg/dL (ref 65–179)
Glucose, 2 hour: 104 mg/dL (ref 65–152)
Glucose, Fasting: 79 mg/dL (ref 65–91)

## 2019-07-10 LAB — CBC
Hematocrit: 35.7 % (ref 34.0–46.6)
Hemoglobin: 12.2 g/dL (ref 11.1–15.9)
MCH: 31 pg (ref 26.6–33.0)
MCHC: 34.2 g/dL (ref 31.5–35.7)
MCV: 91 fL (ref 79–97)
Platelets: 278 10*3/uL (ref 150–450)
RBC: 3.94 x10E6/uL (ref 3.77–5.28)
RDW: 13.2 % (ref 11.7–15.4)
WBC: 9.4 10*3/uL (ref 3.4–10.8)

## 2019-07-10 LAB — RPR: RPR Ser Ql: NONREACTIVE

## 2019-07-10 LAB — ANTIBODY SCREEN: Antibody Screen: NEGATIVE

## 2019-07-10 LAB — HIV ANTIBODY (ROUTINE TESTING W REFLEX): HIV Screen 4th Generation wRfx: NONREACTIVE

## 2019-08-06 ENCOUNTER — Other Ambulatory Visit (HOSPITAL_COMMUNITY)
Admission: RE | Admit: 2019-08-06 | Discharge: 2019-08-06 | Disposition: A | Discharge status: Home / Self Care | Source: Ambulatory Visit | Attending: Obstetrics & Gynecology | Admitting: Obstetrics & Gynecology

## 2019-08-06 ENCOUNTER — Ambulatory Visit (INDEPENDENT_AMBULATORY_CARE_PROVIDER_SITE_OTHER): Admitting: Women's Health

## 2019-08-06 ENCOUNTER — Ambulatory Visit (INDEPENDENT_AMBULATORY_CARE_PROVIDER_SITE_OTHER)

## 2019-08-06 ENCOUNTER — Encounter: Payer: Self-pay | Admitting: Women's Health

## 2019-08-06 ENCOUNTER — Other Ambulatory Visit: Payer: Self-pay

## 2019-08-06 VITALS — BP 112/74 | HR 88 | Wt 152.0 lb

## 2019-08-06 DIAGNOSIS — O0990 Supervision of high risk pregnancy, unspecified, unspecified trimester: Secondary | ICD-10-CM

## 2019-08-06 DIAGNOSIS — Z01419 Encounter for gynecological examination (general) (routine) without abnormal findings: Secondary | ICD-10-CM | POA: Insufficient documentation

## 2019-08-06 DIAGNOSIS — O09522 Supervision of elderly multigravida, second trimester: Secondary | ICD-10-CM

## 2019-08-06 DIAGNOSIS — O0993 Supervision of high risk pregnancy, unspecified, third trimester: Secondary | ICD-10-CM

## 2019-08-06 DIAGNOSIS — Z1389 Encounter for screening for other disorder: Secondary | ICD-10-CM

## 2019-08-06 DIAGNOSIS — Z3A30 30 weeks gestation of pregnancy: Secondary | ICD-10-CM

## 2019-08-06 DIAGNOSIS — R87612 Low grade squamous intraepithelial lesion on cytologic smear of cervix (LGSIL): Secondary | ICD-10-CM

## 2019-08-06 DIAGNOSIS — O09523 Supervision of elderly multigravida, third trimester: Secondary | ICD-10-CM

## 2019-08-06 DIAGNOSIS — O09293 Supervision of pregnancy with other poor reproductive or obstetric history, third trimester: Secondary | ICD-10-CM

## 2019-08-06 DIAGNOSIS — O3443 Maternal care for other abnormalities of cervix, third trimester: Secondary | ICD-10-CM

## 2019-08-06 DIAGNOSIS — O0992 Supervision of high risk pregnancy, unspecified, second trimester: Secondary | ICD-10-CM

## 2019-08-06 DIAGNOSIS — Z331 Pregnant state, incidental: Secondary | ICD-10-CM

## 2019-08-06 LAB — POCT URINALYSIS DIPSTICK OB
Blood, UA: NEGATIVE
Glucose, UA: NEGATIVE
Leukocytes, UA: NEGATIVE
Nitrite, UA: NEGATIVE
POC,PROTEIN,UA: NEGATIVE

## 2019-08-06 NOTE — Progress Notes (Signed)
HIGH-RISK PREGNANCY VISIT Patient name: [name] MRN [id number]  Date of birth: [birth year] Chief Complaint:   Gynecologic Exam (Ultrasound)  History of Present Illness:   [name] is a 39 y.o. G28P2002 female at [redacted]w[redacted]d with an Estimated Date of Delivery: 10/11/19 being seen today for ongoing management of a high-risk pregnancy complicated by advanced maternal age-turns 39yo tomorrow, gestational carrier.  Today she reports some mild swelling lower legs.  Depression screen [facility] 2/9 07/09/2019 03/19/2019 06/01/2018 07/04/2015 06/29/2015  Decreased Interest 0 0 0 0 0  Down, Depressed, Hopeless 0 0 0 0 0  PHQ - 2 Score 0 0 0 0 0  Altered sleeping 0 0 0 - -  Tired, decreased energy 0 0 0 - -  Change in appetite 0 0 0 - -  Feeling bad or failure about yourself  0 0 0 - -  Trouble concentrating 0 0 0 - -  Moving slowly or fidgety/restless 0 0 0 - -  Suicidal thoughts 0 0 0 - -  PHQ-9 Score 0 0 0 - -  Difficult doing work/chores Not difficult at all - - - -    Contractions: Not present. Vag. Bleeding: None.  Movement: Present. denies leaking of fluid.  Review of Systems:   Pertinent items are noted in HPI Denies abnormal vaginal discharge w/ itching/odor/irritation, headaches, visual changes, shortness of breath, chest pain, abdominal pain, severe nausea/vomiting, or problems with urination or bowel movements unless otherwise stated above. Pertinent History Reviewed:  Reviewed past medical,surgical, social, obstetrical and family history.  Reviewed problem list, medications and allergies. Physical Assessment:   Vitals:   08/06/19 0912  BP: 112/74  Pulse: 88  Weight: 152 lb (68.9 kg)  Body mass index is 28.72 kg/m.           Physical Examination:   General appearance: alert, well appearing, and in no distress  Mental status: alert, oriented to person, place, and time  Skin: warm & dry   Extremities: Edema: Trace    Cardiovascular: normal heart rate noted  Respiratory:  normal respiratory effort, no distress  Abdomen: gravid, soft, non-tender  Pelvic: thin prep pap obtained         Fetal Status: Fetal Heart Rate (bpm): 138 u/s   Movement: Present    Fetal Surveillance Testing today:  [place] 40+9 wks,cephalic,posterior placenta gr 3,afi 16 cm,fhr 138 bpm,EFW 1551 g 30% Chaperone: [place]    Results for orders placed or performed in visit on 08/06/19 (from the past 24 hour(s))  POC Urinalysis Dipstick OB   Collection Time: 08/06/19  9:09 AM  Result Value Ref Range   Color, UA     Clarity, UA     Glucose, UA Negative Negative   Bilirubin, UA     Ketones, UA moderate    Spec Grav, UA     Blood, UA n    pH, UA     POC,PROTEIN,UA Negative Negative, Trace, Small (1+), Moderate (2+), Large (3+), 4+   Urobilinogen, UA     Nitrite, UA n    Leukocytes, UA Negative Negative   Appearance     Odor      Assessment & Plan:  1) High-risk pregnancy G3P2002 at [redacted]w[redacted]d with an Estimated Date of Delivery: 10/11/19   2) AMA, stable, turns 39yo tomorrow  3) Prev c/s x 2, for RCS  4) Gestational carrier  5) H/O LSIL pap> 05/2018, repeated today  Meds: No orders of the defined types were placed  in this encounter.   Labs/procedures today: pap  Treatment Plan:  U/S q4wks       2x/wk testing nst/sono @ 36wks        Deliver @ 39wks:____   Reviewed: Preterm labor symptoms and general obstetric precautions including but not limited to vaginal bleeding, contractions, leaking of fluid and fetal movement were reviewed in detail with the patient.  All questions were answered.  Follow-up: Return in about 4 weeks (around 09/03/2019) for HROB, US:EFW, in person, MD only to schedule C/S.  Orders Placed This Encounter  Procedures  . US OB Follow Up  . POC Urinalysis Dipstick OB   Cheral Marker CNM, [facility] 08/06/2019 9:56 AM

## 2019-08-06 NOTE — Patient Instructions (Signed)
[name], I greatly value your feedback.  If you receive a survey following your visit with [place] today, we appreciate you taking the time to fill it out.  Thanks, Joellyn Haff, CNM, WHNP-BC   [facility] at [facility] ([address] [place], [place] [address]) Entrance C, located off of [address] valet parking  Go to [email or web address] to register for FREE online childbirth classes   Call the office [phone]) or go to [facility] if:  You begin to have strong, frequent contractions  Your water breaks.  Sometimes it is a big gush of fluid, sometimes it is just a trickle that keeps getting your panties wet or running down your legs  You have vaginal bleeding.  It is normal to have a small amount of spotting if your cervix was checked.   You don't feel your baby moving like normal.  If you don't, get you something to eat and drink and lay down and focus on feeling your baby move.  You should feel at least 10 movements in 2 hours.  If you don't, you should call the office or go to [facility].    Tdap Vaccine  It is recommended that you get the Tdap vaccine during the third trimester of EACH pregnancy to help protect your baby from getting pertussis (whooping cough)  27-36 weeks is the BEST time to do this so that you can pass the protection on to your baby. During pregnancy is better than after pregnancy, but if you are unable to get it during pregnancy it will be offered at the hospital.   You can get this vaccine with [place], at the health department, your family doctor, or some local pharmacies  Everyone who will be around your baby should also be up-to-date on their vaccines before the baby comes. Adults (who are not pregnant) only need 1 dose of Tdap during adulthood.   [place] Pediatricians/Family Doctors:  [facility] [phone]            [facility] [phone]                 [facility]  [phone] (usually not accepting new patients unless you have family there already, you are always welcome to call and ask)       [facility] Department [phone]       [facility] Pediatricians/Family Doctors:   [facility]: [phone]  [facility]: [phone]  [facility]: [phone]  [facility] Doctors:   [facility]: [phone]   [facility]: [phone]  [facility] Doctors:  [facility]: [phone]   Home Blood Pressure Monitoring for Patients   Your provider has recommended that you check your blood pressure (BP) at least once a week at home. If you do not have a blood pressure cuff at home, one will be provided for you. Contact your provider if you have not received your monitor within 1 week.   Helpful Tips for Accurate Home Blood Pressure Checks  . Don't smoke, exercise, or drink caffeine 30 minutes before checking your BP . Use the restroom before checking your BP (a full bladder can raise your pressure) . Relax in a comfortable upright chair . Feet on the ground . Left arm resting comfortably on a flat surface at the level of your heart . Legs uncrossed . Back supported . Sit quietly and don't talk . Place the cuff on your  bare arm . Adjust snuggly, so that only two fingertips can fit between your skin and the top of the cuff . Check 2 readings separated by at least one minute . Keep a log of your BP readings . For a visual, please reference this diagram: http://ccnc.care/bpdiagram  Provider Name: [facility]     Phone: [phone]  Zone 1: ALL CLEAR  Continue to monitor your symptoms:  . BP reading is less than 140 (top number) or less than 90 (bottom number)  . No right upper stomach pain . No headaches or seeing spots . No feeling nauseated or throwing up . No swelling in face and hands  Zone 2: CAUTION Call your  doctor's office for any of the following:  . BP reading is greater than 140 (top number) or greater than 90 (bottom number)  . Stomach pain under your ribs in the middle or right side . Headaches or seeing spots . Feeling nauseated or throwing up . Swelling in face and hands  Zone 3: EMERGENCY  Seek immediate medical care if you have any of the following:  . BP reading is greater than160 (top number) or greater than 110 (bottom number) . Severe headaches not improving with Tylenol . Serious difficulty catching your breath . Any worsening symptoms from Zone 2   Third Trimester of Pregnancy The third trimester is from week 29 through week 42, months 7 through 9. The third trimester is a time when the fetus is growing rapidly. At the end of the ninth month, the fetus is about 20 inches in length and weighs 6-10 pounds.  BODY CHANGES Your body goes through many changes during pregnancy. The changes vary from woman to woman.   Your weight will continue to increase. You can expect to gain 25-35 pounds (11-16 kg) by the end of the pregnancy.  You may begin to get stretch marks on your hips, abdomen, and breasts.  You may urinate more often because the fetus is moving lower into your pelvis and pressing on your bladder.  You may develop or continue to have heartburn as a result of your pregnancy.  You may develop constipation because certain hormones are causing the muscles that push waste through your intestines to slow down.  You may develop hemorrhoids or swollen, bulging veins (varicose veins).  You may have pelvic pain because of the weight gain and pregnancy hormones relaxing your joints between the bones in your pelvis. Backaches may result from overexertion of the muscles supporting your posture.  You may have changes in your hair. These can include thickening of your hair, rapid growth, and changes in texture. Some women also have hair loss during or after pregnancy, or hair that  feels dry or thin. Your hair will most likely return to normal after your baby is born.  Your breasts will continue to grow and be tender. A yellow discharge may leak from your breasts called colostrum.  Your belly button may stick out.  You may feel short of breath because of your expanding uterus.  You may notice the fetus "dropping," or moving lower in your abdomen.  You may have a bloody mucus discharge. This usually occurs a few days to a week before labor begins.  Your cervix becomes thin and soft (effaced) near your due date. WHAT TO EXPECT AT YOUR PRENATAL EXAMS  You will have prenatal exams every 2 weeks until week 36. Then, you will have weekly prenatal exams. During a routine prenatal visit:  You will be weighed to make sure you and the fetus are growing normally.  Your blood pressure is taken.  Your abdomen will be measured to track your baby's growth.  The fetal heartbeat will be listened to.  Any test results from the previous visit will be discussed.  You may have a cervical check near your due date to see if you have effaced. At around 36 weeks, your caregiver will check your cervix. At the same time, your caregiver will also perform a test on the secretions of the vaginal tissue. This test is to determine if a type of bacteria, Group B streptococcus, is present. Your caregiver will explain this further. Your caregiver may ask you:  What your birth plan is.  How you are feeling.  If you are feeling the baby move.  If you have had any abnormal symptoms, such as leaking fluid, bleeding, severe headaches, or abdominal cramping.  If you have any questions. Other tests or screenings that may be performed during your third trimester include:  Blood tests that check for low iron levels (anemia).  Fetal testing to check the health, activity level, and growth of the fetus. Testing is done if you have certain medical conditions or if there are problems during the  pregnancy. FALSE LABOR You may feel small, irregular contractions that eventually go away. These are called Braxton Hicks contractions, or false labor. Contractions may last for hours, days, or even weeks before true labor sets in. If contractions come at regular intervals, intensify, or become painful, it is best to be seen by your caregiver.  SIGNS OF LABOR   Menstrual-like cramps.  Contractions that are 5 minutes apart or less.  Contractions that start on the top of the uterus and spread down to the lower abdomen and back.  A sense of increased pelvic pressure or back pain.  A watery or bloody mucus discharge that comes from the vagina. If you have any of these signs before the 37th week of pregnancy, call your caregiver right away. You need to go to the hospital to get checked immediately. HOME CARE INSTRUCTIONS   Avoid all smoking, herbs, alcohol, and unprescribed drugs. These chemicals affect the formation and growth of the baby.  Follow your caregiver's instructions regarding medicine use. There are medicines that are either safe or unsafe to take during pregnancy.  Exercise only as directed by your caregiver. Experiencing uterine cramps is a good sign to stop exercising.  Continue to eat regular, healthy meals.  Wear a good support bra for breast tenderness.  Do not use hot tubs, steam rooms, or saunas.  Wear your seat belt at all times when driving.  Avoid raw meat, uncooked cheese, cat litter boxes, and soil used by cats. These carry germs that can cause birth defects in the baby.  Take your prenatal vitamins.  Try taking a stool softener (if your caregiver approves) if you develop constipation. Eat more high-fiber foods, such as fresh vegetables or fruit and whole grains. Drink plenty of fluids to keep your urine clear or pale yellow.  Take warm sitz baths to soothe any pain or discomfort caused by hemorrhoids. Use hemorrhoid cream if your caregiver approves.  If you  develop varicose veins, wear support hose. Elevate your feet for 15 minutes, 3-4 times a day. Limit salt in your diet.  Avoid heavy lifting, wear low heal shoes, and practice good posture.  Rest a lot with your legs elevated if you have leg cramps or low  back pain.  Visit your dentist if you have not gone during your pregnancy. Use a soft toothbrush to brush your teeth and be gentle when you floss.  A sexual relationship may be continued unless your caregiver directs you otherwise.  Do not travel far distances unless it is absolutely necessary and only with the approval of your caregiver.  Take prenatal classes to understand, practice, and ask questions about the labor and delivery.  Make a trial run to the hospital.  Pack your hospital bag.  Prepare the baby's nursery.  Continue to go to all your prenatal visits as directed by your caregiver. SEEK MEDICAL CARE IF:  You are unsure if you are in labor or if your water has broken.  You have dizziness.  You have mild pelvic cramps, pelvic pressure, or nagging pain in your abdominal area.  You have persistent nausea, vomiting, or diarrhea.  You have a bad smelling vaginal discharge.  You have pain with urination. SEEK IMMEDIATE MEDICAL CARE IF:   You have a fever.  You are leaking fluid from your vagina.  You have spotting or bleeding from your vagina.  You have severe abdominal cramping or pain.  You have rapid weight loss or gain.  You have shortness of breath with chest pain.  You notice sudden or extreme swelling of your face, hands, ankles, feet, or legs.  You have not felt your baby move in over an hour.  You have severe headaches that do not go away with medicine.  You have vision changes. Document Released: 03/02/2001 Document Revised: 03/13/2013 Document Reviewed: 05/09/2012 [facility] Patient Information 2015 [place], [place]. This information is not intended to replace advice given to you by your health  care provider. Make sure you discuss any questions you have with your health care provider.

## 2019-08-06 NOTE — Progress Notes (Signed)
[place] 30+4 wks,cephalic,posterior placenta gr 3,afi 16 cm,fhr 138 bpm,EFW 1551 g 30%

## 2019-08-07 ENCOUNTER — Encounter: Payer: Self-pay | Admitting: Women's Health

## 2019-08-07 DIAGNOSIS — Z8742 Personal history of other diseases of the female genital tract: Secondary | ICD-10-CM | POA: Insufficient documentation

## 2019-08-07 LAB — CYTOLOGY - PAP
Adequacy: ABSENT
Comment: NEGATIVE
Diagnosis: NEGATIVE
High risk HPV: NEGATIVE

## 2019-09-03 ENCOUNTER — Ambulatory Visit (INDEPENDENT_AMBULATORY_CARE_PROVIDER_SITE_OTHER): Admitting: Obstetrics and Gynecology

## 2019-09-03 ENCOUNTER — Ambulatory Visit (INDEPENDENT_AMBULATORY_CARE_PROVIDER_SITE_OTHER)

## 2019-09-03 VITALS — BP 129/81 | HR 80 | Wt 158.2 lb

## 2019-09-03 DIAGNOSIS — O0993 Supervision of high risk pregnancy, unspecified, third trimester: Secondary | ICD-10-CM

## 2019-09-03 DIAGNOSIS — O09523 Supervision of elderly multigravida, third trimester: Secondary | ICD-10-CM

## 2019-09-03 DIAGNOSIS — Z3A34 34 weeks gestation of pregnancy: Secondary | ICD-10-CM

## 2019-09-03 DIAGNOSIS — O09522 Supervision of elderly multigravida, second trimester: Secondary | ICD-10-CM

## 2019-09-03 DIAGNOSIS — Z1389 Encounter for screening for other disorder: Secondary | ICD-10-CM

## 2019-09-03 DIAGNOSIS — Z331 Pregnant state, incidental: Secondary | ICD-10-CM

## 2019-09-03 LAB — POCT URINALYSIS DIPSTICK OB
Blood, UA: NEGATIVE
Glucose, UA: NEGATIVE
Ketones, UA: NEGATIVE
Leukocytes, UA: NEGATIVE
Nitrite, UA: NEGATIVE
POC,PROTEIN,UA: NEGATIVE

## 2019-09-03 NOTE — Progress Notes (Signed)
Patient ID: [name], female   DOB: 1980-09-12, 39 y.o.   MRN: [id number]    [facility] PREGNANCY VISIT Patient name: [name] MRN [id number]  Date of birth: [birth year] Chief Complaint:   Routine Prenatal Visit ([place])  History of Present Illness:   [name] is a 39 y.o. G66P2002 female at [redacted]w[redacted]d with an Estimated Date of Delivery: 10/11/19 being seen today for ongoing management of a high-risk pregnancy complicated by AMA 39 yo Today she reports mild ankle edema. She is accompanied by her son.  Contractions: Not present. Vag. Bleeding: None.  Movement: Present. denies leaking of fluid.  Review of Systems:   Pertinent items are noted in HPI Denies abnormal vaginal discharge w/ itching/odor/irritation, headaches, visual changes, shortness of breath, chest pain, abdominal pain, severe nausea/vomiting, or problems with urination or bowel movements unless otherwise stated above. Pertinent History Reviewed:  Reviewed past medical,surgical, social, obstetrical and family history.  Reviewed problem list, medications and allergies. Physical Assessment:   Vitals:   09/03/19 1154  BP: 129/81  Pulse: 80  Weight: 158 lb 3.2 oz (71.8 kg)  Body mass index is 29.89 kg/m.           Physical Examination:   General appearance: alert, well appearing, and in no distress and oriented to person, place, and time  Mental status: alert, oriented to person, place, and time, normal mood, behavior, speech, dress, motor activity, and thought processes, affect appropriate to mood  Skin: warm & dry   Extremities: Edema: Trace    Cardiovascular: normal heart rate noted  Respiratory: normal respiratory effort, no distress  Abdomen: gravid, soft, non-tender  Pelvic: Cervical exam deferred         Fetal Status:   Fundal Height: 35 cm Movement: Present Presentation: Vertex  Fetal Surveillance Testing today: [place]   FOLLOW UP SONOGRAM   [name] is in the office for a follow up sonogram  for EFW .  She is a 39 y.o. year old G62P2002 with Estimated Date of Delivery: 10/11/19 by LMP now at  [redacted]w[redacted]d weeks gestation. Thus far the pregnancy has been complicated by AMA,surrogate.  GESTATION: SINGLETON  PRESENTATION: cephalic  FETAL ACTIVITY:          Heart rate         135          The fetus is active.  AMNIOTIC FLUID: The amniotic fluid volume is  normal, 12.6 cm.  PLACENTA LOCALIZATION:  posterior GRADE 3  CERVIX: Limited view   ADNEXA: wnl   GESTATIONAL AGE AND  BIOMETRICS:  Gestational criteria: Estimated Date of Delivery: 10/11/19 by LMP now at [redacted]w[redacted]d  Previous Scans:4              BIPARIETAL DIAMETER           8.46 cm         34 weeks  HEAD CIRCUMFERENCE           30.65 cm         34+1 weeks  ABDOMINAL CIRCUMFERENCE           30.15 cm         34 weeks  FEMUR LENGTH           6.51 cm         33+4 weeks  AVERAGE EGA(BY THIS SCAN):  33+6 weeks                                                 ESTIMATED FETAL WEIGHT:       2316  grams, 29 % ANATOMICAL SURVEY                                                                            COMMENTS CEREBRAL VENTRICLES yes normal   CHOROID PLEXUS yes normal   CEREBELLUM yes normal   CISTERNA MAGNA yes normal                       FACIAL PROFILE yes normal   4 CHAMBERED HEART yes normal Limited view   OUTFLOW TRACTS yes normal   DIAPHRAGM yes normal   STOMACH yes normal   RENAL REGION yes normal   BLADDER yes normal        3 VESSEL CORD yes normal                  GENITALIA yes normal female        SUSPECTED ABNORMALITIES:  no  QUALITY OF SCAN: Limited view   TECHNICIAN COMMENTS:  [place] 34+4 wks,cephalic,posterior placenta gr 3,fhr 135 bpm,AFI 12.6 cm,EFW 2316 g 29%        A copy of this report including all images has been saved and backed up to a  second source for retrieval if needed. All measures and details of the anatomical scan, placentation, fluid volume and pelvic anatomy are contained in that report.  Amber Flora Lipps 09/03/2019 11:56 AM       Results for orders placed or performed in visit on 09/03/19 (from the past 24 hour(s))  POC Urinalysis Dipstick OB   Collection Time: 09/03/19 11:56 AM  Result Value Ref Range   Color, UA     Clarity, UA     Glucose, UA Negative Negative   Bilirubin, UA     Ketones, UA neg    Spec Grav, UA     Blood, UA neg    pH, UA     POC,PROTEIN,UA Negative Negative, Trace, Small (1+), Moderate (2+), Large (3+), 4+   Urobilinogen, UA     Nitrite, UA neg    Leukocytes, UA Negative Negative   Appearance     Odor      Assessment & Plan:  1) High-risk pregnancy G3P2002 at [redacted]w[redacted]d with an Estimated Date of Delivery: 10/11/19   2) AMA, stable to begin biweekly testing in 10 d  3) Prev c/s x 2, scheduled for repeat c section on 10/05/2019 posted by Dr Despina Hidden 4) Gestational carrier, couple arrives here July 9  5) H/O LSIL pap> 05/2018, repeated on 08/06/2019 and came back normal with some yeast  Meds: No orders of the defined types were placed in this encounter.  Labs/procedures today: none  Treatment Plan: Follow up in 10 days for NST  Reviewed: Preterm labor symptoms and general obstetric precautions including but not limited to vaginal bleeding, contractions, leaking  of fluid and fetal movement were reviewed in detail with the patient.  All questions were answered. Check bp weekly, let us know if >140/90.   Follow-up: Return in about 10 days (around 09/13/2019) for NST, HROB.visit  By signing my name below, I, De Burrs, attest that this documentation has been prepared under the direction and in the presence of Jonnie Kind, MD. Electronically Signed: De Burrs, Medical Scribe. 09/03/19. 12:14 PM.  I personally performed the services described in this documentation, which  was SCRIBED in my presence. The recorded information has been reviewed and considered accurate. It has been edited as necessary during review. Jonnie Kind, MD

## 2019-09-03 NOTE — Progress Notes (Signed)
[place] 34+4 wks,cephalic,posterior placenta gr 3,fhr 135 bpm, AFI 12.6 cm,EFW 2316 g 29%

## 2019-09-10 ENCOUNTER — Inpatient Hospital Stay (HOSPITAL_COMMUNITY)
Admission: AD | Admit: 2019-09-10 | Discharge: 2019-09-10 | Disposition: A | Discharge status: Home / Self Care | Attending: Obstetrics & Gynecology | Admitting: Obstetrics & Gynecology

## 2019-09-10 ENCOUNTER — Encounter (HOSPITAL_COMMUNITY): Payer: Self-pay | Admitting: Obstetrics & Gynecology

## 2019-09-10 DIAGNOSIS — O4703 False labor before 37 completed weeks of gestation, third trimester: Secondary | ICD-10-CM | POA: Diagnosis not present

## 2019-09-10 DIAGNOSIS — Z0371 Encounter for suspected problem with amniotic cavity and membrane ruled out: Secondary | ICD-10-CM | POA: Diagnosis not present

## 2019-09-10 DIAGNOSIS — Z3A35 35 weeks gestation of pregnancy: Secondary | ICD-10-CM | POA: Diagnosis not present

## 2019-09-10 LAB — POCT FERN TEST: POCT Fern Test: NEGATIVE

## 2019-09-10 NOTE — MAU Note (Signed)
PT SAYS SROM - HAS BEEN LEAKING SINCE SAT- THEN WOKE AT 0500- WITH A WET SPOT IN BED.  LEAKED AGAIN AT 630PM.  . FEELS PRESSURE .   IS SCH FOR REPT C/S ON 7-16TH WITH DR Despina Hidden.

## 2019-09-10 NOTE — Discharge Instructions (Signed)
Signs and Symptoms of Labor Labor is your body's natural process of moving your baby, placenta, and umbilical cord out of your uterus. The process of labor usually starts when your baby is full-term, between 37 and 40 weeks of pregnancy. How will I know when I am close to going into labor? As your body prepares for labor and the birth of your baby, you may notice the following symptoms in the weeks and days before true labor starts:  Having a strong desire to get your home ready to receive your new baby. This is called nesting. Nesting may be a sign that labor is approaching, and it may occur several weeks before birth. Nesting may involve cleaning and organizing your home.  Passing a small amount of thick, bloody mucus out of your vagina (normal bloody show or losing your mucus plug). This may happen more than a week before labor begins, or it might occur right before labor begins as the opening of the cervix starts to widen (dilate). For some women, the entire mucus plug passes at once. For others, smaller portions of the mucus plug may gradually pass over several days.  Your baby moving (dropping) lower in your pelvis to get into position for birth (lightening). When this happens, you may feel more pressure on your bladder and pelvic bone and less pressure on your ribs. This may make it easier to breathe. It may also cause you to need to urinate more often and have problems with bowel movements.  Having "practice contractions" (Braxton Hicks contractions) that occur at irregular (unevenly spaced) intervals that are more than 10 minutes apart. This is also called false labor. False labor contractions are common after exercise or sexual activity, and they will stop if you change position, rest, or drink fluids. These contractions are usually mild and do not get stronger over time. They may feel like: ? A backache or back pain. ? Mild cramps, similar to menstrual cramps. ? Tightening or pressure in  your abdomen. Other early symptoms that labor may be starting soon include:  Nausea or loss of appetite.  Diarrhea.  Having a sudden burst of energy, or feeling very tired.  Mood changes.  Having trouble sleeping. How will I know when labor has begun? Signs that true labor has begun may include:  Having contractions that come at regular (evenly spaced) intervals and increase in intensity. This may feel like more intense tightening or pressure in your abdomen that moves to your back. ? Contractions may also feel like rhythmic pain in your upper thighs or back that comes and goes at regular intervals. ? For first-time mothers, this change in intensity of contractions often occurs at a more gradual pace. ? Women who have given birth before may notice a more rapid progression of contraction changes.  Having a feeling of pressure in the vaginal area.  Your water breaking (rupture of membranes). This is when the sac of fluid that surrounds your baby breaks. When this happens, you will notice fluid leaking from your vagina. This may be clear or blood-tinged. Labor usually starts within 24 hours of your water breaking, but it may take longer to begin. ? Some women notice this as a gush of fluid. ? Others notice that their underwear repeatedly becomes damp. Follow these instructions at home:   When labor starts, or if your water breaks, call your health care provider or nurse care line. Based on your situation, they will determine when you should go in for an   exam.  When you are in early labor, you may be able to rest and manage symptoms at home. Some strategies to try at home include: ? Breathing and relaxation techniques. ? Taking a warm bath or shower. ? Listening to music. ? Using a heating pad on the lower back for pain. If you are directed to use heat:  Place a towel between your skin and the heat source.  Leave the heat on for 20-30 minutes.  Remove the heat if your skin turns  bright red. This is especially important if you are unable to feel pain, heat, or cold. You may have a greater risk of getting burned. Get help right away if:  You have painful, regular contractions that are 5 minutes apart or less.  Labor starts before you are [redacted] weeks along in your pregnancy.  You have a fever.  You have a headache that does not go away.  You have bright red blood coming from your vagina.  You do not feel your baby moving.  You have a sudden onset of: ? Severe headache with vision problems. ? Nausea, vomiting, or diarrhea. ? Chest pain or shortness of breath. These symptoms may be an emergency. If your health care provider recommends that you go to the hospital or birth center where you plan to deliver, do not drive yourself. Have someone else drive you, or call emergency services (911 in the [place] Summary  Labor is your body's natural process of moving your baby, placenta, and umbilical cord out of your uterus.  The process of labor usually starts when your baby is full-term, between 37 and 40 weeks of pregnancy.  When labor starts, or if your water breaks, call your health care provider or nurse care line. Based on your situation, they will determine when you should go in for an exam. This information is not intended to replace advice given to you by your health care provider. Make sure you discuss any questions you have with your health care provider. Document Revised: 12/06/2016 Document Reviewed: 08/13/2016 Elsevier Patient Education  2020 [employer] Fetal Movement Counts Patient Name: ________________________________________________ Patient Due Date: ____________________ What is a fetal movement count?  A fetal movement count is the number of times that you feel your baby move during a certain amount of time. This may also be called a fetal kick count. A fetal movement count is recommended for every pregnant woman. You may be asked to start counting  fetal movements as early as week 28 of your pregnancy. Pay attention to when your baby is most active. You may notice your baby's sleep and wake cycles. You may also notice things that make your baby move more. You should do a fetal movement count:  When your baby is normally most active.  At the same time each day. A good time to count movements is while you are resting, after having something to eat and drink. How do I count fetal movements? 1. Find a quiet, comfortable area. Sit, or lie down on your side. 2. Write down the date, the start time and stop time, and the number of movements that you felt between those two times. Take this information with you to your health care visits. 3. Write down your start time when you feel the first movement. 4. Count kicks, flutters, swishes, rolls, and jabs. You should feel at least 10 movements. 5. You may stop counting after you have felt 10 movements, or if you have been counting for 2   hours. Write down the stop time. 6. If you do not feel 10 movements in 2 hours, contact your health care provider for further instructions. Your health care provider may want to do additional tests to assess your baby's well-being. Contact a health care provider if:  You feel fewer than 10 movements in 2 hours.  Your baby is not moving like he or she usually does. Date: ____________ Start time: ____________ Stop time: ____________ Movements: ____________ Date: ____________ Start time: ____________ Stop time: ____________ Movements: ____________ Date: ____________ Start time: ____________ Stop time: ____________ Movements: ____________ Date: ____________ Start time: ____________ Stop time: ____________ Movements: ____________ Date: ____________ Start time: ____________ Stop time: ____________ Movements: ____________ Date: ____________ Start time: ____________ Stop time: ____________ Movements: ____________ Date: ____________ Start time: ____________ Stop time:  ____________ Movements: ____________ Date: ____________ Start time: ____________ Stop time: ____________ Movements: ____________ Date: ____________ Start time: ____________ Stop time: ____________ Movements: ____________ This information is not intended to replace advice given to you by your health care provider. Make sure you discuss any questions you have with your health care provider. Document Revised: 10/26/2018 Document Reviewed: 10/26/2018 Elsevier Patient Education  2020 Elsevier Inc.  

## 2019-09-10 NOTE — [facility] Provider Note (Signed)
First Provider Initiated Contact with Patient 09/10/19 2118       S: [name] is a 39 y.o. [id number] at [redacted]w[redacted]d  who presents to [facility] today complaining of leaking of fluid since Saturday. She denies vaginal bleeding. She denies contractions. She reports normal fetal movement.    O: BP 139/86   Pulse 74   Temp 98.4 F (36.9 C) (Oral)   Resp 20   Ht 5\' 1"  (1.549 m)   Wt 72 kg   LMP 01/04/2019 (Exact Date)   BMI 29.99 kg/m  GENERAL: Well-developed, well-nourished female in no acute distress.  HEAD: Normocephalic, atraumatic.  CHEST: Normal effort of breathing, regular heart rate ABDOMEN: Soft, nontender, gravid PELVIC: Normal external female genitalia. Vagina is pink and rugated. Cervix with normal contour, no lesions. Normal discharge.  No pooling.   Cervical exam:  Dilation: Closed Effacement (%): Thick Cervical Position: Posterior Exam by:: [email or web address] NP   Fetal Monitoring: Baseline: 140 Variability: moderate Accelerations: 15x15 Decelerations: none Contractions: x1  Results for orders placed or performed during the hospital encounter of 09/10/19 (from the past 24 hour(s))  POCT fern test     Status: Normal   Collection Time: 09/10/19  9:36 PM  Result Value Ref Range   POCT Fern Test Negative = intact amniotic membranes      A: [facility] at [redacted]w[redacted]d  Membranes intact  P: Discharge home in stable condition    [redacted]w[redacted]d, NP 09/10/2019 9:38 PM

## 2019-09-13 ENCOUNTER — Other Ambulatory Visit: Payer: Self-pay | Admitting: Advanced Practice Midwife

## 2019-09-13 MED ORDER — TERCONAZOLE 0.4 % VA CREA: 1.0000 | TOPICAL_CREAM | Freq: Every day | VAGINAL | 0 refills | Status: DC

## 2019-09-13 NOTE — Progress Notes (Unsigned)
terezol sent for yeast sx

## 2019-09-14 ENCOUNTER — Ambulatory Visit (INDEPENDENT_AMBULATORY_CARE_PROVIDER_SITE_OTHER): Admitting: Women's Health

## 2019-09-14 ENCOUNTER — Encounter: Payer: Self-pay | Admitting: Women's Health

## 2019-09-14 ENCOUNTER — Other Ambulatory Visit (HOSPITAL_COMMUNITY)
Admission: RE | Admit: 2019-09-14 | Discharge: 2019-09-14 | Disposition: A | Discharge status: Home / Self Care | Source: Ambulatory Visit | Attending: Obstetrics and Gynecology | Admitting: Obstetrics and Gynecology

## 2019-09-14 ENCOUNTER — Other Ambulatory Visit

## 2019-09-14 VITALS — BP 136/83 | HR 94 | Wt 159.0 lb

## 2019-09-14 DIAGNOSIS — Z3A36 36 weeks gestation of pregnancy: Secondary | ICD-10-CM | POA: Insufficient documentation

## 2019-09-14 DIAGNOSIS — Z3483 Encounter for supervision of other normal pregnancy, third trimester: Secondary | ICD-10-CM | POA: Diagnosis present

## 2019-09-14 DIAGNOSIS — O09523 Supervision of elderly multigravida, third trimester: Secondary | ICD-10-CM | POA: Diagnosis not present

## 2019-09-14 DIAGNOSIS — O09522 Supervision of elderly multigravida, second trimester: Secondary | ICD-10-CM

## 2019-09-14 DIAGNOSIS — Z1389 Encounter for screening for other disorder: Secondary | ICD-10-CM

## 2019-09-14 DIAGNOSIS — O0993 Supervision of high risk pregnancy, unspecified, third trimester: Secondary | ICD-10-CM

## 2019-09-14 DIAGNOSIS — Z3480 Encounter for supervision of other normal pregnancy, unspecified trimester: Secondary | ICD-10-CM

## 2019-09-14 DIAGNOSIS — O34219 Maternal care for unspecified type scar from previous cesarean delivery: Secondary | ICD-10-CM | POA: Diagnosis not present

## 2019-09-14 DIAGNOSIS — Z331 Pregnant state, incidental: Secondary | ICD-10-CM

## 2019-09-14 LAB — POCT URINALYSIS DIPSTICK OB
Blood, UA: NEGATIVE
Glucose, UA: NEGATIVE
Leukocytes, UA: NEGATIVE
Nitrite, UA: NEGATIVE
POC,PROTEIN,UA: NEGATIVE

## 2019-09-14 NOTE — Patient Instructions (Signed)
[name], I greatly value your feedback.  If you receive a survey following your visit with [place] today, we appreciate you taking the time to fill it out.  Thanks, Joellyn Haff, CNM, WHNP-BC  [facility] ([address] [place], [place] [address]) Entrance C, located off of [address] valet parking   Go to [email or web address] to register for FREE online childbirth classes    Call the office [phone]) or go to [facility] if:  You begin to have strong, frequent contractions  Your water breaks.  Sometimes it is a big gush of fluid, sometimes it is just a trickle that keeps getting your panties wet or running down your legs  You have vaginal bleeding.  It is normal to have a small amount of spotting if your cervix was checked.   You don't feel your baby moving like normal.  If you don't, get you something to eat and drink and lay down and focus on feeling your baby move.  You should feel at least 10 movements in 2 hours.  If you don't, you should call the office or go to [facility].   Call the office [phone]) or go to [facility] for these signs of pre-eclampsia:  Severe headache that does not go away with Tylenol  Visual changes- seeing spots, double, blurred vision  Pain under your right breast or upper abdomen that does not go away with Tums or heartburn medicine  Nausea and/or vomiting  Severe swelling in your hands, feet, and face    Home Blood Pressure Monitoring for Patients   Your provider has recommended that you check your blood pressure (BP) at least once a week at home. If you do not have a blood pressure cuff at home, one will be provided for you. Contact your provider if you have not received your monitor within 1 week.   Helpful Tips for Accurate Home Blood Pressure Checks   Don't smoke, exercise, or drink caffeine 30 minutes before checking your BP  Use the restroom before checking your BP (a full  bladder can raise your pressure)  Relax in a comfortable upright chair  Feet on the ground  Left arm resting comfortably on a flat surface at the level of your heart  Legs uncrossed  Back supported  Sit quietly and don't talk  Place the cuff on your bare arm  Adjust snuggly, so that only two fingertips can fit between your skin and the top of the cuff  Check 2 readings separated by at least one minute  Keep a log of your BP readings  For a visual, please reference this diagram: http://ccnc.care/bpdiagram  Provider Name: [facility]     Phone: [phone]  Zone 1: ALL CLEAR  Continue to monitor your symptoms:   BP reading is less than 140 (top number) or less than 90 (bottom number)   No right upper stomach pain  No headaches or seeing spots  No feeling nauseated or throwing up  No swelling in face and hands  Zone 2: CAUTION Call your doctor's office for any of the following:   BP reading is greater than 140 (top number) or greater than 90 (bottom number)   Stomach pain under your ribs in the middle or right side  Headaches or seeing spots  Feeling nauseated or throwing up  Swelling in face and hands  Zone 3: EMERGENCY  Seek immediate medical care if you have any of  the following:   BP reading is greater than160 (top number) or greater than 110 (bottom number)  Severe headaches not improving with Tylenol  Serious difficulty catching your breath  Any worsening symptoms from Zone 2  Preterm Labor and Birth Information  The normal length of a pregnancy is 39-41 weeks. Preterm labor is when labor starts before 37 completed weeks of pregnancy. What are the risk factors for preterm labor? Preterm labor is more likely to occur in women who:  Have certain infections during pregnancy such as a bladder infection, sexually transmitted infection, or infection inside the uterus (chorioamnionitis).  Have a shorter-than-normal cervix.  Have gone into  preterm labor before.  Have had surgery on their cervix.  Are younger than age 41 or older than age 1.  Are African American.  Are pregnant with twins or multiple babies (multiple gestation).  Take street drugs or smoke while pregnant.  Do not gain enough weight while pregnant.  Became pregnant shortly after having been pregnant. What are the symptoms of preterm labor? Symptoms of preterm labor include:  Cramps similar to those that can happen during a menstrual period. The cramps may happen with diarrhea.  Pain in the abdomen or lower back.  Regular uterine contractions that may feel like tightening of the abdomen.  A feeling of increased pressure in the pelvis.  Increased watery or bloody mucus discharge from the vagina.  Water breaking (ruptured amniotic sac). Why is it important to recognize signs of preterm labor? It is important to recognize signs of preterm labor because babies who are born prematurely may not be fully developed. This can put them at an increased risk for:  Long-term (chronic) heart and lung problems.  Difficulty immediately after birth with regulating body systems, including blood sugar, body temperature, heart rate, and breathing rate.  Bleeding in the brain.  Cerebral palsy.  Learning difficulties.  Death. These risks are highest for babies who are born before 76 weeks of pregnancy. How is preterm labor treated? Treatment depends on the length of your pregnancy, your condition, and the health of your baby. It may involve: 1. Having a stitch (suture) placed in your cervix to prevent your cervix from opening too early (cerclage). 2. Taking or being given medicines, such as: ? Hormone medicines. These may be given early in pregnancy to help support the pregnancy. ? Medicine to stop contractions. ? Medicines to help mature the babys lungs. These may be prescribed if the risk of delivery is high. ? Medicines to prevent your baby from  developing cerebral palsy. If the labor happens before 34 weeks of pregnancy, you may need to stay in the hospital. What should I do if I think I am in preterm labor? If you think that you are going into preterm labor, call your health care provider right away. How can I prevent preterm labor in future pregnancies? To increase your chance of having a full-term pregnancy:  Do not use any tobacco products, such as cigarettes, chewing tobacco, and e-cigarettes. If you need help quitting, ask your health care provider.  Do not use street drugs or medicines that have not been prescribed to you during your pregnancy.  Talk with your health care provider before taking any herbal supplements, even if you have been taking them regularly.  Make sure you gain a healthy amount of weight during your pregnancy.  Watch for infection. If you think that you might have an infection, get it checked right away.  Make sure to  tell your health care provider if you have gone into preterm labor before. This information is not intended to replace advice given to you by your health care provider. Make sure you discuss any questions you have with your health care provider. Document Revised: 06/30/2018 Document Reviewed: 07/30/2015 Elsevier Patient Education  [place].

## 2019-09-14 NOTE — Progress Notes (Signed)
HIGH-RISK PREGNANCY VISIT Patient name: [name] MRN [id number]  Date of birth: June 06, 1980 Chief Complaint:   Routine Prenatal Visit  History of Present Illness:   [name] is a 39 y.o. G40P2002 female at [redacted]w[redacted]d with an Estimated Date of Delivery: 10/11/19 being seen today for ongoing management of a high-risk pregnancy complicated by advanced maternal age, gestational carrier.  Today she reports taking terazol for yeast infection after antibiotics for URI. Denies ha, visual changes, ruq/epigastric pain, n/v.   Depression screen [facility] 2/9 07/09/2019 03/19/2019 06/01/2018 07/04/2015 06/29/2015  Decreased Interest 0 0 0 0 0  Down, Depressed, Hopeless 0 0 0 0 0  PHQ - 2 Score 0 0 0 0 0  Altered sleeping 0 0 0 - -  Tired, decreased energy 0 0 0 - -  Change in appetite 0 0 0 - -  Feeling bad or failure about yourself  0 0 0 - -  Trouble concentrating 0 0 0 - -  Moving slowly or fidgety/restless 0 0 0 - -  Suicidal thoughts 0 0 0 - -  PHQ-9 Score 0 0 0 - -  Difficult doing work/chores Not difficult at all - - - -    Contractions: Not present. Vag. Bleeding: None.  Movement: Present. denies leaking of fluid.  Review of Systems:   Pertinent items are noted in HPI Denies abnormal vaginal discharge w/ itching/odor/irritation, headaches, visual changes, shortness of breath, chest pain, abdominal pain, severe nausea/vomiting, or problems with urination or bowel movements unless otherwise stated above. Pertinent History Reviewed:  Reviewed past medical,surgical, social, obstetrical and family history.  Reviewed problem list, medications and allergies. Physical Assessment:   Vitals:   09/14/19 1009  BP: 136/83  Pulse: 94  Weight: 159 lb (72.1 kg)  Body mass index is 30.04 kg/m.           Physical Examination:   General appearance: alert, well appearing, and in no distress  Mental status: alert, oriented to person, place, and time  Skin: warm & dry   Extremities: Edema: Trace     Cardiovascular: normal heart rate noted  Respiratory: normal respiratory effort, no distress  Abdomen: gravid, soft, non-tender  Pelvic: gbs, gc/ct collected, declined SVE         Fetal Status: Fetal Heart Rate (bpm): 125 Fundal Height: 36 cm Movement: Present    Fetal Surveillance Testing today: NST: FHR baseline 125 bpm, Variability: moderate, Accelerations:present, Decelerations:  Absent= Cat 1/Reactive Toco: 1 uc     Chaperone: pt declined chaperone    Results for orders placed or performed in visit on 09/14/19 (from the past 24 hour(s))  POC Urinalysis Dipstick OB   Collection Time: 09/14/19 10:15 AM  Result Value Ref Range   Color, UA     Clarity, UA     Glucose, UA Negative Negative   Bilirubin, UA     Ketones, UA small    Spec Grav, UA     Blood, UA neg    pH, UA     POC,PROTEIN,UA Negative Negative, Trace, Small (1+), Moderate (2+), Large (3+), 4+   Urobilinogen, UA     Nitrite, UA neg    Leukocytes, UA Negative Negative   Appearance     Odor      Assessment & Plan:  1) High-risk pregnancy G3P2002 at [redacted]w[redacted]d with an Estimated Date of Delivery: 10/11/19   2) AMA, stable  3) Gestational carrier  4) Prev c/s x 2> for RCS  5) BP  high-normal> asymptomatic, no proteinuria, reviewed pre-e s/s, reasons to seek care, check home bp's  Meds: No orders of the defined types were placed in this encounter.  Labs/procedures today: gbs, gc/ct, nst  Treatment Plan:  U/S q4wks (last @ 34.4wks)       2x/wk testing nst/sono @ 36wks        Deliver @ 39wks:____   Reviewed: Preterm labor symptoms and general obstetric precautions including but not limited to vaginal bleeding, contractions, leaking of fluid and fetal movement were reviewed in detail with the patient.  All questions were answered.   Follow-up: Return for change next week to [facility] NST/RN, Thurs NST/HROB please; needs weekly bpp/dopp @ MFM if available. No bpp/dopp available here until 7/14, note routed to Tish to see if  we can get her in w/ MFM for weekly bpp/dopp (if able to, can cancel one of the NSTs qwk here)  Orders Placed This Encounter  Procedures  . Culture, beta strep (group b only)  . POC Urinalysis Dipstick OB   Cheral Marker CNM, [facility] 09/14/2019 12:34 PM

## 2019-09-17 ENCOUNTER — Other Ambulatory Visit: Payer: Self-pay | Admitting: Women's Health

## 2019-09-17 ENCOUNTER — Other Ambulatory Visit: Payer: Self-pay

## 2019-09-17 ENCOUNTER — Ambulatory Visit (INDEPENDENT_AMBULATORY_CARE_PROVIDER_SITE_OTHER)

## 2019-09-17 ENCOUNTER — Other Ambulatory Visit

## 2019-09-17 DIAGNOSIS — Z3A36 36 weeks gestation of pregnancy: Secondary | ICD-10-CM

## 2019-09-17 DIAGNOSIS — O09523 Supervision of elderly multigravida, third trimester: Secondary | ICD-10-CM

## 2019-09-17 DIAGNOSIS — R87612 Low grade squamous intraepithelial lesion on cytologic smear of cervix (LGSIL): Secondary | ICD-10-CM

## 2019-09-17 DIAGNOSIS — O0990 Supervision of high risk pregnancy, unspecified, unspecified trimester: Secondary | ICD-10-CM

## 2019-09-17 DIAGNOSIS — Z333 Pregnant state, gestational carrier: Secondary | ICD-10-CM | POA: Diagnosis not present

## 2019-09-17 LAB — CERVICOVAGINAL ANCILLARY ONLY
Chlamydia: NEGATIVE
Comment: NEGATIVE
Comment: NORMAL
Neisseria Gonorrhea: NEGATIVE

## 2019-09-17 LAB — CULTURE, BETA STREP (GROUP B ONLY): Strep Gp B Culture: POSITIVE — AB

## 2019-09-17 NOTE — Progress Notes (Signed)
[place] 36+4 wks,cephalic,BPP 8/8,FHR 133 bpm,posterior placenta gr 3,AFI 12 cm

## 2019-09-18 ENCOUNTER — Other Ambulatory Visit: Admitting: Obstetrics & Gynecology

## 2019-09-19 NOTE — Progress Notes (Signed)
PATIENT ID: [name], female     DOB: [birth year], 39 y.o.     MRN: [id number]     [facility] PREGNANCY VISIT PATIENT NAME: [name] MRN [id number]  DOB: 11/03/1980 Chief Complaint:   Routine Prenatal Visit (NST)  History of Present Illness:   [name] is a 39 y.o. G43P2002 female at [redacted]w[redacted]d with an Estimated Date of Delivery: 10/11/19 being seen today for ongoing management of a high-risk pregnancy complicated by advanced maternal age.  Today she reports no complaints.   Depression screen [facility] 2/9 07/09/2019 03/19/2019 06/01/2018 07/04/2015 06/29/2015  Decreased Interest 0 0 0 0 0  Down, Depressed, Hopeless 0 0 0 0 0  PHQ - 2 Score 0 0 0 0 0  Altered sleeping 0 0 0 - -  Tired, decreased energy 0 0 0 - -  Change in appetite 0 0 0 - -  Feeling bad or failure about yourself  0 0 0 - -  Trouble concentrating 0 0 0 - -  Moving slowly or fidgety/restless 0 0 0 - -  Suicidal thoughts 0 0 0 - -  PHQ-9 Score 0 0 0 - -  Difficult doing work/chores Not difficult at all - - - -    Contractions: Not present. Vag. Bleeding: None.  Movement: Present. denies leaking of fluid.   Review of Systems:   Pertinent items are noted in HPI Denies abnormal vaginal discharge w/ itching/odor/irritation, headaches, visual changes, shortness of breath, chest pain, abdominal pain, severe nausea/vomiting, or problems with urination or bowel movements unless otherwise stated above.  Pertinent History Reviewed:  Reviewed past medical,surgical, social, obstetrical and family history.  Reviewed problem list, medications and allergies.  Physical Assessment:   Vitals:   09/20/19 1009  BP: (!) 144/84  Pulse: 70  Weight: 163 lb 6.4 oz (74.1 kg)  Body mass index is 30.87 kg/m.           Physical Examination:   General appearance: alert, well appearing, and in no distress, oriented to person, place, and time and normal appearing weight  Mental status: alert, oriented to person, place, and time,  normal mood, behavior, speech, dress, motor activity, and thought processes, affect appropriate to mood  Skin: warm & dry   Extremities: Edema: Mild pitting, slight indentation    Cardiovascular: normal heart rate noted  Respiratory: normal respiratory effort, no distress  Abdomen: gravid, soft, non-tender   Baby: 120 bpm   37 cm  Pelvic: Cervical exam deferred         Fetal Status:     Movement: Present    Fetal Surveillance Testing today: NST   Chaperone: [employer]    Results for orders placed or performed in visit on 09/20/19 (from the past 24 hour(s))  POC Urinalysis Dipstick OB   Collection Time: 09/20/19 10:07 AM  Result Value Ref Range   Color, UA     Clarity, UA     Glucose, UA Negative Negative   Bilirubin, UA     Ketones, UA n    Spec Grav, UA     Blood, UA n    pH, UA     POC,PROTEIN,UA Moderate (2+) Negative, Trace, Small (1+), Moderate (2+), Large (3+), 4+   Urobilinogen, UA     Nitrite, UA n    Leukocytes, UA Negative Negative   Appearance     Odor       Assessment & Plan:  1) High-risk pregnancy G3P2002 at [redacted]w[redacted]d with an  Estimated Date of Delivery: 10/11/19   2) AMA with reactive NST,   3) elevated pressure x 1, will recheck x 2 this pm at home , stable  Meds: No orders of the defined types were placed in this encounter.   Labs/procedures today: urine pr/cr sent out.  Treatment Plan:  bp check x 2 urine pr/cr, NST in 4 d if results are good.  Reviewed: Term labor symptoms and general obstetric precautions including but not limited to vaginal bleeding, contractions, leaking of fluid and fetal movement were reviewed in detail with the patient.  All questions were answered. Check bp weekly, let us know if >140/90.   Follow-up: No follow-ups on file.   By signing my name below, I, [employer], attest that this documentation has been prepared under the direction and in the presence of Tilda Burrow, MD. Electronically Signed: Mal [facility]  Scribe. 09/20/19. 11:12 AM.  I personally performed the services described in this documentation, which was SCRIBED in my presence. The recorded information has been reviewed and considered accurate. It has been edited as necessary during review. Tilda Burrow, MD

## 2019-09-20 ENCOUNTER — Encounter (HOSPITAL_COMMUNITY)
Admission: AD | Disposition: A | Discharge status: Home / Self Care | Payer: Self-pay | Source: Home / Self Care | Attending: Obstetrics and Gynecology

## 2019-09-20 ENCOUNTER — Encounter (HOSPITAL_COMMUNITY): Payer: Self-pay | Admitting: Anesthesiology

## 2019-09-20 ENCOUNTER — Other Ambulatory Visit: Payer: Self-pay

## 2019-09-20 ENCOUNTER — Other Ambulatory Visit: Payer: Self-pay | Admitting: Women's Health

## 2019-09-20 ENCOUNTER — Ambulatory Visit (INDEPENDENT_AMBULATORY_CARE_PROVIDER_SITE_OTHER): Admitting: Obstetrics and Gynecology

## 2019-09-20 ENCOUNTER — Inpatient Hospital Stay (HOSPITAL_COMMUNITY)
Admission: AD | Admit: 2019-09-20 | Discharge: 2019-09-23 | DRG: 784 | Disposition: A | Discharge status: Home / Self Care | Attending: Obstetrics and Gynecology | Admitting: Obstetrics and Gynecology

## 2019-09-20 ENCOUNTER — Encounter: Payer: Self-pay | Admitting: Obstetrics and Gynecology

## 2019-09-20 ENCOUNTER — Encounter (HOSPITAL_COMMUNITY): Payer: Self-pay | Admitting: Obstetrics and Gynecology

## 2019-09-20 VITALS — BP 144/84 | HR 70 | Wt 163.4 lb

## 2019-09-20 DIAGNOSIS — Z20822 Contact with and (suspected) exposure to covid-19: Secondary | ICD-10-CM | POA: Diagnosis present

## 2019-09-20 DIAGNOSIS — Z302 Encounter for sterilization: Secondary | ICD-10-CM

## 2019-09-20 DIAGNOSIS — O99824 Streptococcus B carrier state complicating childbirth: Secondary | ICD-10-CM | POA: Diagnosis present

## 2019-09-20 DIAGNOSIS — Z3A37 37 weeks gestation of pregnancy: Secondary | ICD-10-CM | POA: Diagnosis not present

## 2019-09-20 DIAGNOSIS — O1413 Severe pre-eclampsia, third trimester: Secondary | ICD-10-CM | POA: Diagnosis not present

## 2019-09-20 DIAGNOSIS — O34211 Maternal care for low transverse scar from previous cesarean delivery: Secondary | ICD-10-CM | POA: Diagnosis present

## 2019-09-20 DIAGNOSIS — O1414 Severe pre-eclampsia complicating childbirth: Secondary | ICD-10-CM | POA: Diagnosis present

## 2019-09-20 DIAGNOSIS — J4520 Mild intermittent asthma, uncomplicated: Secondary | ICD-10-CM | POA: Diagnosis present

## 2019-09-20 DIAGNOSIS — Z1389 Encounter for screening for other disorder: Secondary | ICD-10-CM

## 2019-09-20 DIAGNOSIS — Z331 Pregnant state, incidental: Secondary | ICD-10-CM

## 2019-09-20 DIAGNOSIS — O9952 Diseases of the respiratory system complicating childbirth: Secondary | ICD-10-CM | POA: Diagnosis present

## 2019-09-20 DIAGNOSIS — D62 Acute posthemorrhagic anemia: Secondary | ICD-10-CM | POA: Diagnosis not present

## 2019-09-20 DIAGNOSIS — O09523 Supervision of elderly multigravida, third trimester: Secondary | ICD-10-CM

## 2019-09-20 DIAGNOSIS — O9081 Anemia of the puerperium: Secondary | ICD-10-CM | POA: Diagnosis not present

## 2019-09-20 DIAGNOSIS — Z3A36 36 weeks gestation of pregnancy: Secondary | ICD-10-CM

## 2019-09-20 DIAGNOSIS — Z800 Family history of malignant neoplasm of digestive organs: Secondary | ICD-10-CM

## 2019-09-20 DIAGNOSIS — Z9851 Tubal ligation status: Secondary | ICD-10-CM

## 2019-09-20 DIAGNOSIS — O1410 Severe pre-eclampsia, unspecified trimester: Secondary | ICD-10-CM | POA: Diagnosis present

## 2019-09-20 DIAGNOSIS — R809 Proteinuria, unspecified: Secondary | ICD-10-CM

## 2019-09-20 DIAGNOSIS — O09529 Supervision of elderly multigravida, unspecified trimester: Secondary | ICD-10-CM

## 2019-09-20 DIAGNOSIS — Z333 Pregnant state, gestational carrier: Secondary | ICD-10-CM

## 2019-09-20 DIAGNOSIS — R87612 Low grade squamous intraepithelial lesion on cytologic smear of cervix (LGSIL): Secondary | ICD-10-CM

## 2019-09-20 DIAGNOSIS — O0990 Supervision of high risk pregnancy, unspecified, unspecified trimester: Secondary | ICD-10-CM

## 2019-09-20 DIAGNOSIS — O99820 Streptococcus B carrier state complicating pregnancy: Secondary | ICD-10-CM | POA: Diagnosis not present

## 2019-09-20 DIAGNOSIS — Z3689 Encounter for other specified antenatal screening: Secondary | ICD-10-CM

## 2019-09-20 DIAGNOSIS — R030 Elevated blood-pressure reading, without diagnosis of hypertension: Secondary | ICD-10-CM | POA: Diagnosis present

## 2019-09-20 LAB — COMPREHENSIVE METABOLIC PANEL
ALT: 10 U/L (ref 0–44)
AST: 17 U/L (ref 15–41)
Albumin: 2.5 g/dL — ABNORMAL LOW (ref 3.5–5.0)
Alkaline Phosphatase: 299 U/L — ABNORMAL HIGH (ref 38–126)
Anion gap: 8 (ref 5–15)
BUN: 11 mg/dL (ref 6–20)
CO2: 20 mmol/L — ABNORMAL LOW (ref 22–32)
Calcium: 8.8 mg/dL — ABNORMAL LOW (ref 8.9–10.3)
Chloride: 108 mmol/L (ref 98–111)
Creatinine, Ser: 0.6 mg/dL (ref 0.44–1.00)
GFR calc Af Amer: >60 mL/min (ref >60)
GFR calc non Af Amer: >60 mL/min (ref >60)
Glucose, Bld: 78 mg/dL (ref 70–99)
Potassium: 3.7 mmol/L (ref 3.5–5.1)
Sodium: 136 mmol/L (ref 135–145)
Total Bilirubin: 0.4 mg/dL (ref 0.3–1.2)
Total Protein: 5.6 g/dL — ABNORMAL LOW (ref 6.5–8.1)

## 2019-09-20 LAB — CBC
HCT: 33.6 % — ABNORMAL LOW (ref 36.0–46.0)
Hemoglobin: 11 g/dL — ABNORMAL LOW (ref 12.0–15.0)
MCH: 28.9 pg (ref 26.0–34.0)
MCHC: 32.7 g/dL (ref 30.0–36.0)
MCV: 88.2 fL (ref 80.0–100.0)
Platelets: 229 10*3/uL (ref 150–400)
RBC: 3.81 MIL/uL — ABNORMAL LOW (ref 3.87–5.11)
RDW: 13.6 % (ref 11.5–15.5)
WBC: 10.4 10*3/uL (ref 4.0–10.5)
nRBC: 0 % (ref 0.0–0.2)

## 2019-09-20 LAB — URINALYSIS, ROUTINE W REFLEX MICROSCOPIC
Bilirubin Urine: NEGATIVE
Glucose, UA: NEGATIVE mg/dL
Hgb urine dipstick: NEGATIVE
Ketones, ur: NEGATIVE mg/dL
Leukocytes,Ua: NEGATIVE
Nitrite: NEGATIVE
Protein, ur: 100 mg/dL — AB
Specific Gravity, Urine: 1.033 — ABNORMAL HIGH (ref 1.005–1.030)
pH: 5 (ref 5.0–8.0)

## 2019-09-20 LAB — POCT URINALYSIS DIPSTICK OB
Blood, UA: NEGATIVE
Glucose, UA: NEGATIVE
Ketones, UA: NEGATIVE
Leukocytes, UA: NEGATIVE
Nitrite, UA: NEGATIVE

## 2019-09-20 LAB — TYPE AND SCREEN
ABO/RH(D): A POS
Antibody Screen: NEGATIVE

## 2019-09-20 LAB — ABO/RH: ABO/RH(D): A POS

## 2019-09-20 LAB — PROTEIN / CREATININE RATIO, URINE
Creatinine, Urine: 278.55 mg/dL
Protein Creatinine Ratio: 0.81 mg/mg{Cre} — ABNORMAL HIGH (ref 0.00–0.15)
Total Protein, Urine: 225 mg/dL

## 2019-09-20 LAB — SARS CORONAVIRUS 2 BY RT PCR (HOSPITAL ORDER, PERFORMED IN ~~LOC~~ HOSPITAL LAB): SARS Coronavirus 2: NEGATIVE

## 2019-09-20 SURGERY — Surgical Case
Anesthesia: Regional

## 2019-09-20 MED ORDER — LABETALOL HCL 5 MG/ML IV SOLN: 80.0000 mg | INTRAVENOUS | Status: DC | PRN

## 2019-09-20 MED ORDER — LABETALOL HCL 5 MG/ML IV SOLN: 40.0000 mg | INTRAVENOUS | Status: DC | PRN

## 2019-09-20 MED ORDER — CEFAZOLIN SODIUM-DEXTROSE 2-4 GM/100ML-% IV SOLN
2.0000 g | INTRAVENOUS | Status: AC
  Administered 2019-09-21: 2 g via INTRAVENOUS
  Filled 2019-09-20: qty 100

## 2019-09-20 MED ORDER — LABETALOL HCL 5 MG/ML IV SOLN: 20.0000 mg | INTRAVENOUS | Status: DC | PRN

## 2019-09-20 MED ORDER — HYDRALAZINE HCL 20 MG/ML IJ SOLN: 10.0000 mg | INTRAMUSCULAR | Status: DC | PRN

## 2019-09-20 MED ORDER — LABETALOL HCL 5 MG/ML IV SOLN
20.0000 mg | INTRAVENOUS | Status: DC | PRN
  Administered 2019-09-20: 20 mg via INTRAVENOUS
  Filled 2019-09-20: qty 4

## 2019-09-20 MED ORDER — HYDRALAZINE HCL 20 MG/ML IJ SOLN
5.0000 mg | INTRAMUSCULAR | Status: DC | PRN
  Administered 2019-09-20: 5 mg via INTRAVENOUS
  Filled 2019-09-20: qty 1

## 2019-09-20 MED ORDER — MAGNESIUM SULFATE BOLUS VIA INFUSION
4.0000 g | Freq: Once | INTRAVENOUS | Status: AC
  Administered 2019-09-20: 4 g via INTRAVENOUS
  Filled 2019-09-20: qty 1000

## 2019-09-20 MED ORDER — ONDANSETRON HCL 4 MG/2ML IJ SOLN
INTRAMUSCULAR | Status: AC
  Filled 2019-09-20: qty 2

## 2019-09-20 MED ORDER — SOD CITRATE-CITRIC ACID 500-334 MG/5ML PO SOLN
30.0000 mL | ORAL | Status: AC
  Administered 2019-09-21: 30 mL via ORAL
  Filled 2019-09-20: qty 30

## 2019-09-20 MED ORDER — FENTANYL CITRATE (PF) 100 MCG/2ML IJ SOLN
INTRAMUSCULAR | Status: AC
  Filled 2019-09-20: qty 2

## 2019-09-20 MED ORDER — OXYTOCIN-SODIUM CHLORIDE 30-0.9 UT/500ML-% IV SOLN
INTRAVENOUS | Status: AC
  Filled 2019-09-20: qty 500

## 2019-09-20 MED ORDER — MAGNESIUM SULFATE 40 GM/1000ML IV SOLN
2.0000 g/h | INTRAVENOUS | Status: DC
  Administered 2019-09-20: 2 g/h via INTRAVENOUS
  Filled 2019-09-20: qty 1000

## 2019-09-20 MED ORDER — PHENYLEPHRINE HCL-NACL 20-0.9 MG/250ML-% IV SOLN
INTRAVENOUS | Status: AC
  Filled 2019-09-20: qty 250

## 2019-09-20 MED ORDER — LACTATED RINGERS IV SOLN: INTRAVENOUS | Status: DC

## 2019-09-20 MED ORDER — MORPHINE SULFATE (PF) 0.5 MG/ML IJ SOLN
INTRAMUSCULAR | Status: AC
  Filled 2019-09-20: qty 10

## 2019-09-20 MED ORDER — POVIDONE-IODINE 10 % EX SWAB
2.0000 "application " | Freq: Once | CUTANEOUS | Status: AC
  Administered 2019-09-20: 2 via TOPICAL

## 2019-09-20 NOTE — H&P (Signed)
LABOR AND DELIVERY ADMISSION HISTORY AND PHYSICAL NOTE  [name] is a 39 y.o. female G40P2002 with IUP at [redacted]w[redacted]d by 10wk US/IUI presenting for elevated BP's.   Seen at [facility] earlier today and noted to have mild range BP On arrival to [facility] has had multiple severe range BP's Currently denies headache, vision changes, chest pain, SOB, RUQ pain Does endorse some LE edema that has been present for several weeks  She reports positive fetal movement. She denies leakage of fluid or vaginal bleeding.   She plans on breast feeding per surrogate request. She is already s/p BTL but would like salpingectomy at time of cesarean if technically feasible.  Prenatal History/Complications: PNC at [facility]  Sono:  @[redacted]w[redacted]d , CWD, normal anatomy, cephalic presentation, posterior placenta, 30%ile, EFW 1551g  Pregnancy complications:  - Pre-eclampsia with severe features  Past Medical History: Past Medical History:  Diagnosis Date  . Asthma    prn inhaler  . Finger mass, right 03/2013   index finger  . GERD (gastroesophageal reflux disease)   . H/O hiatal hernia   . History of esophageal stricture    multiple esophageal dilations - states x 12  . Migraine   . PONV (postoperative nausea and vomiting)    nausea only    Past Surgical History: Past Surgical History:  Procedure Laterality Date  . BALLOON DILATION  06/16/2011   Procedure: BALLOON DILATION;  Surgeon: 06/18/2011, MD;  Location: [facility] ENDOSCOPY;  Service: Endoscopy;  Laterality: N/A;  . BALLOON DILATION  07/07/2011   Procedure: BALLOON DILATION;  Surgeon: 07/09/2011, MD;  Location: [facility];  Service: Endoscopy;  Laterality: N/A;  . BALLOON DILATION  07/28/2011   Procedure: BALLOON DILATION;  Surgeon: 09/27/2011, MD;  Location: [facility];  Service: Endoscopy;  Laterality: N/A;  . CESAREAN SECTION  1999; 02/07/2003  . ESOPHAGOGASTRODUODENOSCOPY  07/07/2011  . ESOPHAGOGASTRODUODENOSCOPY  06/16/2011   Procedure:  ESOPHAGOGASTRODUODENOSCOPY (EGD);  Surgeon: 06/18/2011, MD;  Location: [facility];  Service: Endoscopy;  Laterality: N/A;  . ESOPHAGOGASTRODUODENOSCOPY  07/28/2011   Procedure: ESOPHAGOGASTRODUODENOSCOPY (EGD);  Surgeon: 09/27/2011, MD;  Location: [facility];  Service: Endoscopy;  Laterality: N/A;  . ESOPHAGOGASTRODUODENOSCOPY (EGD) WITH PROPOFOL N/A 06/26/2014   Procedure: ESOPHAGOGASTRODUODENOSCOPY (EGD) WITH PROPOFOL;  Surgeon: 08/26/2014, MD;  Location: [facility] ENDOSCOPY;  Service: Endoscopy;  Laterality: N/A;  . FINGER MASS EXCISION Left    index finger  . FOREIGN BODY REMOVAL N/A 06/26/2014   Procedure: FOREIGN BODY REMOVAL;  Surgeon: 08/26/2014, MD;  Location: [facility] ENDOSCOPY;  Service: Endoscopy;  Laterality: N/A;  . MASS EXCISION Right 04/16/2013   Procedure: RIGHT INDEX EXCISION MASS;  Surgeon: 04/18/2013, MD;  Location: [facility];  Service: Orthopedics;  Laterality: Right;  . TUBAL LIGATION      Obstetrical History: OB History    Gravida  3   Para  2   Term  2   Preterm      AB      Living  2     SAB      TAB      Ectopic      Multiple      Live Births  2           Social History: Social History   Socioeconomic History  . Marital status: Married    Spouse name: Not on file  . Number of children: 2  . Years of education: Not on file  . Highest education  level: Not on file  Occupational History  . Not on file  Tobacco Use  . Smoking status: Never Smoker  . Smokeless tobacco: Never Used  Vaping Use  . Vaping Use: Never used  Substance and Sexual Activity  . Alcohol use: No  . Drug use: No  . Sexual activity: Yes    Birth control/protection: Surgical    Comment: tubal  Other Topics Concern  . Not on file  Social History Narrative  . Not on file   Social Determinants of Health   Financial Resource Strain: Low Risk   . Difficulty of Paying Living Expenses: Not hard at all  Food Insecurity: No Food Insecurity  . Worried  About Programme researcher, broadcasting/film/videounning Out of Food in the Last Year: Never true  . Ran Out of Food in the Last Year: Never true  Transportation Needs: No Transportation Needs  . Lack of Transportation (Medical): No  . Lack of Transportation (Non-Medical): No  Physical Activity: Insufficiently Active  . Days of Exercise per Week: 3 days  . Minutes of Exercise per Session: 30 min  Stress: No Stress Concern Present  . Feeling of Stress : Not at all  Social Connections: Moderately Integrated  . Frequency of Communication with Friends and Family: More than three times a week  . Frequency of Social Gatherings with Friends and Family: More than three times a week  . Attends Religious Services: Never  . Active Member of Clubs or Organizations: Yes  . Attends BankerClub or Organization Meetings: More than 4 times per year  . Marital Status: Married    Family History: Family History  Problem Relation Age of Onset  . Anesthesia problems Mother        post-op nausea  . Colon cancer Father     Allergies: Allergies  Allergen Reactions  . Doxycycline Rash  . Codeine Nausea And Vomiting  . Sucralfate Nausea And Vomiting  . Augmentin [Amoxicillin-Pot Clavulanate]     Pt states she recently took amoxicillin without incident, but cannot take the combination in Augmentin.   Has patient had a PCN reaction causing immediate rash, facial/tongue/throat swelling, SOB or lightheadedness with hypotension: No Has patient had a PCN reaction causing severe rash involving mucus membranes or skin necrosis: No Has patient had a PCN reaction that required hospitalization No Has patient had a PCN reaction occurring within the last 10 years: No If all of the above answers are "NO  . [place] Other (See Comments)    dizziness    Medications Prior to Admission  Medication Sig Dispense Refill Last Dose  . acetaminophen (TYLENOL) 500 MG tablet Take 1,000 mg by mouth every 6 (six) hours as needed for mild pain, moderate pain, fever or  headache.   09/20/2019 at 1600  . Pediatric Multiple Vit-C-FA (FLINSTONES GUMMIES OMEGA-3 DHA) CHEW Chew by mouth. Takes 2 daily   09/20/2019 at Unknown time  . albuterol (PROVENTIL HFA;VENTOLIN HFA) 108 (90 BASE) MCG/ACT inhaler Inhale 2 puffs into the lungs every 4 (four) hours as needed for wheezing or shortness of breath.      . Doxylamine-Pyridoxine ER (BONJESTA) 20-20 MG TBCR Take 1 tablet by mouth at bedtime. Can add 1 tablet in the morning if needed for nausea and vomiting (Patient not taking: Reported on 09/20/2019) 60 tablet 8      Review of Systems  All systems reviewed and negative except as stated in HPI  Physical Exam Blood pressure (!) 156/83, pulse (!) 56, temperature 97.6 F (36.4 C),  resp. rate 18, height 5\' 1"  (1.549 m), weight 74.4 kg, last menstrual period 01/04/2019, SpO2 100 %. General appearance: alert, oriented, NAD Lungs: normal respiratory effort Heart: regular rate Abdomen: soft, non-tender; gravid Extremities: No calf swelling or tenderness FHR: baseline 125, moderate variability, +accels, no decels, no contractions. Cat I  Prenatal labs: ABO, Rh: A/Positive/-- (12/28 1457) Antibody: Negative (04/19 0905) Rubella: 1.03 (12/28 1457) RPR: Non Reactive (04/19 0905)  HBsAg: Negative (12/28 1457)  HIV: Non Reactive (04/19 0905)  GC/Chlamydia: neg/neg 09/14/2019  GBS: Positive/-- (06/25 1520)  2-hr GTT: normal 07/09/2019 07/11/2019) Genetic screening:  Normal integrated test/first trimester nuchal Anatomy (24,268,341: normal  Prenatal Transfer Tool  Maternal Diabetes: No Genetic Screening: Normal Maternal Ultrasounds/Referrals: Normal Fetal Ultrasounds or other Referrals:  None Maternal Substance Abuse:  No Significant Maternal Medications:  None Significant Maternal Lab Results: Group B Strep positive  Results for orders placed or performed in visit on 09/20/19 (from the past 24 hour(s))  POC Urinalysis Dipstick OB   Collection Time: 09/20/19 10:07 AM  Result  Value Ref Range   Color, UA     Clarity, UA     Glucose, UA Negative Negative   Bilirubin, UA     Ketones, UA n    Spec Grav, UA     Blood, UA n    pH, UA     POC,PROTEIN,UA Moderate (2+) Negative, Trace, Small (1+), Moderate (2+), Large (3+), 4+   Urobilinogen, UA     Nitrite, UA n    Leukocytes, UA Negative Negative   Appearance     Odor      Patient Active Problem List   Diagnosis Date Noted  . History of abnormal cervical Pap smear 08/07/2019  . Previous cesarean section 07/09/2019  . AMA (advanced maternal age) multigravida 35+ 07/09/2019  . Pregnant state, gestational carrier 03/19/2019  . Supervision of high risk pregnancy, antepartum 03/19/2019  . 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 Family history of colon cancer in father 05/17/2018  . Anxiety and depression 11/30/2011  . 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 EE (eosinophilic esophagitis) 07/26/2011  . Mild intermittent asthma 07/26/2011  . ASTHMA 11/14/2008  . GERD 11/14/2008  . DYSPHAGIA UNSPECIFIED 11/14/2008    Assessment: LAURINA FISCHL is a 39 y.o. G3P2002 at [redacted]w[redacted]d here for RCS due to new diagnosis of pre-eclampsia with severe features.  #RCS:  The risks of cesarean section discussed with the patient included but were not limited to: bleeding which may require transfusion or reoperation; infection which may require antibiotics; injury to bowel, bladder, ureters or other surrounding organs; injury to the fetus; need for additional procedures including hysterectomy in the event of a life-threatening hemorrhage; placental abnormalities with subsequent pregnancies, incisional problems, thromboembolic phenomenon and other postoperative/anesthesia complications. The patient concurred with the proposed plan, giving informed written consent for the procedure. Patient NPO at this time, last ate small meal at 1830 and some water around 2100, will try to push back until 0030 but  will need to go sooner if BP's are not able to be controlled with IV anti-HTN meds. Anesthesia and OR aware. Preoperative prophylactic antibiotics and SCDs ordered on call to the OR. To OR when ready.  Per patient surrogates are en route from [redacted]w[redacted]d  #Pre-eclampsia with severe features: Multiple severe range BP's since arrival, patient is asymptomatic Started on IV anti-HTN protocol, Mg bolus/gtt Proceed with delivery  #Anesthesia: Spinal #FWB: Cat I #GBS/ID: Positive #COVID: swab  pending #MOF: per patient surrogates would like her to breast feed #MOC: already s/p BTL, would like salpingectomy for reduction in ovarian cancer risk if feasibly possible at the time of cesarean. Reviewed risks and benefits in particular bleeding, written consent obtained. #Circ: n/a   Mary Sella [facility] 09/20/2019, 9:18 PM

## 2019-09-20 NOTE — MAU Provider Note (Signed)
History     CSN: [id number]  Arrival date and time: 09/20/19 2006   First Provider Initiated Contact with Patient 09/20/19 2102      Chief Complaint  Patient presents with  . Hypertension   [name] is a 39 y.o. G3P2002 at [redacted]w[redacted]d who presents to MAU for preeclampsia evaluation after she had a severe range pressure at home. Patient reports no issues with cHTN or high blood pressure during pregnancy until her OB visit this morning. Patient is a gestational carrier. Patient reports she ate potato skin with cheese, bacon and sour cream and water around 630PM.  Pt denies HA, blurry vision/seeing spots, N/V, epigastric pain, swelling in face and hands, sudden weight gain. Pt denies chest pain and SOB.  Pt denies constipation, diarrhea, or urinary problems. Pt denies fever, chills, fatigue, sweating or changes in appetite. Pt denies dizziness, light-headedness, weakness.  Pt denies VB, ctx, LOF and reports good FM.  Current pregnancy problems? AMA, GBS+ Blood Type? A Positive Allergies? Doxy, codeine, carafate, Augmentin, red dye Current medications? Tylenol, Flinestone gummies Current P[facility] & next appt? [facility], next appt 09/25/2019   OB History    Gravida  3   Para  2   Term  2   Preterm      AB      Living  2     SAB      TAB      Ectopic      Multiple      Live Births  2           Past Medical History:  Diagnosis Date  . Asthma    prn inhaler  . Finger mass, right 03/2013   index finger  . GERD (gastroesophageal reflux disease)   . H/O hiatal hernia   . History of esophageal stricture    multiple esophageal dilations - states x 12  . Migraine   . PONV (postoperative nausea and vomiting)    nausea only    Past Surgical History:  Procedure Laterality Date  . BALLOON DILATION  06/16/2011   Procedure: BALLOON DILATION;  Surgeon: Willis Modena, MD;  Location: [facility];  Service: Endoscopy;  Laterality: N/A;  . BALLOON DILATION   07/07/2011   Procedure: BALLOON DILATION;  Surgeon: Willis Modena, MD;  Location: [facility];  Service: Endoscopy;  Laterality: N/A;  . BALLOON DILATION  07/28/2011   Procedure: BALLOON DILATION;  Surgeon: Willis Modena, MD;  Location: [facility];  Service: Endoscopy;  Laterality: N/A;  . CESAREAN SECTION  1999; 02/07/2003  . ESOPHAGOGASTRODUODENOSCOPY  07/07/2011  . ESOPHAGOGASTRODUODENOSCOPY  06/16/2011   Procedure: ESOPHAGOGASTRODUODENOSCOPY (EGD);  Surgeon: Willis Modena, MD;  Location: [facility];  Service: Endoscopy;  Laterality: N/A;  . ESOPHAGOGASTRODUODENOSCOPY  07/28/2011   Procedure: ESOPHAGOGASTRODUODENOSCOPY (EGD);  Surgeon: Willis Modena, MD;  Location: [facility];  Service: Endoscopy;  Laterality: N/A;  . ESOPHAGOGASTRODUODENOSCOPY (EGD) WITH PROPOFOL N/A 06/26/2014   Procedure: ESOPHAGOGASTRODUODENOSCOPY (EGD) WITH PROPOFOL;  Surgeon: Wandalee Ferdinand, MD;  Location: [facility];  Service: Endoscopy;  Laterality: N/A;  . FINGER MASS EXCISION Left    index finger  . FOREIGN BODY REMOVAL N/A 06/26/2014   Procedure: FOREIGN BODY REMOVAL;  Surgeon: Wandalee Ferdinand, MD;  Location: [facility] ENDOSCOPY;  Service: Endoscopy;  Laterality: N/A;  . MASS EXCISION Right 04/16/2013   Procedure: RIGHT INDEX EXCISION MASS;  Surgeon: Tami Ribas, MD;  Location: [facility];  Service: Orthopedics;  Laterality: Right;  . TUBAL LIGATION  Family History  Problem Relation Age of Onset  . Anesthesia problems Mother        post-op nausea  . Colon cancer Father     Social History   Tobacco Use  . Smoking status: Never Smoker  . Smokeless tobacco: Never Used  Vaping Use  . Vaping Use: Never used  Substance Use Topics  . Alcohol use: No  . Drug use: No    Allergies:  Allergies  Allergen Reactions  . Doxycycline Rash  . Codeine Nausea And Vomiting  . Sucralfate Nausea And Vomiting  . Augmentin [Amoxicillin-Pot Clavulanate]     Pt states she recently took amoxicillin without  incident, but cannot take the combination in Augmentin.   Has patient had a PCN reaction causing immediate rash, facial/tongue/throat swelling, SOB or lightheadedness with hypotension: No Has patient had a PCN reaction causing severe rash involving mucus membranes or skin necrosis: No Has patient had a PCN reaction that required hospitalization No Has patient had a PCN reaction occurring within the last 10 years: No If all of the above answers are "NO  . Red Dye Other (See Comments)    dizziness    Medications Prior to Admission  Medication Sig Dispense Refill Last Dose  . acetaminophen (TYLENOL) 500 MG tablet Take 1,000 mg by mouth every 6 (six) hours as needed for mild pain, moderate pain, fever or headache.   09/20/2019 at 1600  . Pediatric Multiple Vit-C-FA (FLINSTONES GUMMIES OMEGA-3 DHA) CHEW Chew by mouth. Takes 2 daily   09/20/2019 at Unknown time  . albuterol (PROVENTIL HFA;VENTOLIN HFA) 108 (90 BASE) MCG/ACT inhaler Inhale 2 puffs into the lungs every 4 (four) hours as needed for wheezing or shortness of breath.      . Doxylamine-Pyridoxine ER (BONJESTA) 20-20 MG TBCR Take 1 tablet by mouth at bedtime. Can add 1 tablet in the morning if needed for nausea and vomiting (Patient not taking: Reported on 09/20/2019) 60 tablet 8     Review of Systems  Constitutional: Negative for chills, diaphoresis, fatigue and fever.  Eyes: Negative for visual disturbance.  Respiratory: Negative for shortness of breath.   Cardiovascular: Negative for chest pain.  Gastrointestinal: Negative for abdominal pain, constipation, diarrhea, nausea and vomiting.  Genitourinary: Negative for dysuria, flank pain, frequency, pelvic pain, urgency, vaginal bleeding and vaginal discharge.  Neurological: Negative for dizziness, weakness, light-headedness and headaches.   Physical Exam   Blood pressure (!) 156/83, pulse (!) 56, temperature 97.6 F (36.4 C), resp. rate 18, height 5\' 1"  (1.549 m), weight 74.4 kg, last  menstrual period 01/04/2019, SpO2 100 %.  Patient Vitals for the past 24 hrs:  BP Temp Pulse Resp SpO2 Height Weight  09/20/19 2101 (!) 156/83 -- (!) 56 -- -- -- --  09/20/19 2055 (!) 159/81 -- (!) 56 -- -- -- --  09/20/19 2045 (!) 155/83 -- 64 -- 100 % -- --  09/20/19 2024 (!) 161/76 -- (!) 58 -- -- -- --  09/20/19 2018 -- 97.6 F (36.4 C) -- 18 -- 5\' 1"  (1.549 m) 74.4 kg   Physical Exam Vitals and nursing note reviewed.  Constitutional:      General: She is not in acute distress.    Appearance: She is normal weight. She is not ill-appearing, toxic-appearing or diaphoretic.  HENT:     Head: Normocephalic and atraumatic.  Pulmonary:     Effort: Pulmonary effort is normal.  Neurological:     Mental Status: She is alert.  Psychiatric:  Mood and Affect: Mood normal.        Behavior: Behavior normal.        Thought Content: Thought content normal.        Judgment: Judgment normal.    Results for orders placed or performed in visit on 09/20/19 (from the past 24 hour(s))  POC Urinalysis Dipstick OB     Status: None   Collection Time: 09/20/19 10:07 AM  Result Value Ref Range   Color, UA     Clarity, UA     Glucose, UA Negative Negative   Bilirubin, UA     Ketones, UA n    Spec Grav, UA     Blood, UA n    pH, UA     POC,PROTEIN,UA Moderate (2+) Negative, Trace, Small (1+), Moderate (2+), Large (3+), 4+   Urobilinogen, UA     Nitrite, UA n    Leukocytes, UA Negative Negative   Appearance     Odor      MAU Course  Procedures  MDM -preeclamspia with severe features based on severe range pressures -patient asymptomatic -hydralazine protocol started as patient reports history of asthma -Dr. Donavan Foil notified, will admit for repeat C/S -patient surrogate for couple in [place] and will notify couple to come down to [place] -EFM: reactive       -baseline: 125       -variability: moderate       -accels: present, 15x15       -decels: absent       -TOCO: few ctx, irritability, pt  not feeling -prepare for OR  Orders Placed This Encounter  Procedures  . SARS Coronavirus 2 by RT PCR (hospital order, performed in [facility] hospital lab) Nasopharyngeal Nasopharyngeal Swab    Standing Status:   Standing    Number of Occurrences:   1    Order Specific Question:   Is this test for diagnosis or screening    Answer:   Screening    Order Specific Question:   Symptomatic for COVID-19 as defined by CDC    Answer:   No    Order Specific Question:   Hospitalized for COVID-19    Answer:   No    Order Specific Question:   Admitted to ICU for COVID-19    Answer:   No    Order Specific Question:   Previously tested for COVID-19    Answer:   No    Order Specific Question:   Resident in a congregate (group) care setting    Answer:   No    Order Specific Question:   Employed in healthcare setting    Answer:   No    Order Specific Question:   Pregnant    Answer:   Yes    Order Specific Question:   Has patient completed COVID vaccination(s) (2 doses of Pfizer/Moderna 1 dose of [employer])    Answer:   Unknown  . Urinalysis, Routine w reflex microscopic    Standing Status:   Standing    Number of Occurrences:   1  . CBC    Standing Status:   Standing    Number of Occurrences:   1  . Comprehensive metabolic panel    Standing Status:   Standing    Number of Occurrences:   1  . Protein / creatinine ratio, urine    Standing Status:   Standing    Number of Occurrences:   1  . Diet NPO time specified  Standing Status:   Standing    Number of Occurrences:   1  . Notify Physician    Confirmatory reading of BP> 160/110 15 minutes later    Standing Status:   Standing    Number of Occurrences:   1    Order Specific Question:   Notify Physician    Answer:   Temp greater than or equal to 100.4    Order Specific Question:   Notify Physician    Answer:   RR greater than 24 or less than 10    Order Specific Question:   Notify Physician    Answer:   HR greater than 120 or  less than 50    Order Specific Question:   Notify Physician    Answer:   SBP greater than 160 mmHG or less than 80 mmHG    Order Specific Question:   Notify Physician    Answer:   DBP greater than 110 mmHG or less than 45 mmHG    Order Specific Question:   Notify Physician    Answer:   Urinary output is less than 120ml for any 4 hour period  . Measure blood pressure    10 minutes after giving labetalol 40 MG IV dose.  Call MD if SBP >/= 160 or DBP >/= 110.    Standing Status:   Standing    Number of Occurrences:   1  . Type and screen [facility]    [facility]     Standing Status:   Standing    Number of Occurrences:   1  . ABO/Rh    Standing Status:   Standing    Number of Occurrences:   1   Meds ordered this encounter  Medications  . AND Linked Order Group   . hydrALAZINE (APRESOLINE) injection 5 mg   . hydrALAZINE (APRESOLINE) injection 10 mg   . labetalol (NORMODYNE) injection 20 mg   . labetalol (NORMODYNE) injection 40 mg    Assessment and Plan   1. Severe pre-eclampsia in third trimester   2. Supervision of high risk pregnancy, antepartum   3. LGSIL on Pap smear of cervix   4. [redacted] weeks gestation of pregnancy   5. NST (non-stress test) reactive    -prepare for OR  Odie Seraicole E Lonn Im 09/20/2019, 9:35 PM

## 2019-09-20 NOTE — MAU Note (Signed)
Today in the office my b/p was elevated alittle. THey told me to start taking it periodically at home. Tonight it was elevated. No pain or any other concerns. Good FM

## 2019-09-20 NOTE — Telephone Encounter (Signed)
Patient left office and then BP elevation noted. Pt called and will check bp x 2 at home and report back to our office thiis afternoon. Urine Pr/Cr to be sent out of office.

## 2019-09-20 NOTE — Anesthesia Preprocedure Evaluation (Deleted)
Anesthesia Evaluation    Reviewed: Allergy & Precautions, Patient's Chart, lab work & pertinent test results  History of Anesthesia Complications Negative for: history of anesthetic complications  Airway        Dental   Pulmonary asthma ,           Cardiovascular hypertension (severe pre-eclampsia),      Neuro/Psych negative neurological ROS  negative psych ROS   GI/Hepatic Neg liver ROS, hiatal hernia, GERD  ,Esophageal stricture s/p dilations   Endo/Other  negative endocrine ROS  Renal/GU negative Renal ROS  negative genitourinary   Musculoskeletal negative musculoskeletal ROS (+)   Abdominal   Peds  Hematology negative hematology ROS (+)   Anesthesia Other Findings Prior C/S x2  Reproductive/Obstetrics (+) Pregnancy                             Anesthesia Physical Anesthesia Plan  ASA: III and emergent  Anesthesia Plan: Spinal   Post-op Pain Management:    Induction:   PONV Risk Score and Plan: 3 and Ondansetron and Treatment may vary due to age or medical condition  Airway Management Planned: Natural Airway  Additional Equipment: None  Intra-op Plan:   Post-operative Plan:   Informed Consent:   Plan Discussed with:   Anesthesia Plan Comments:        Anesthesia Quick Evaluation

## 2019-09-20 NOTE — Addendum Note (Signed)
Addended by: Lynnell Dike on: 09/20/2019 11:38 AM   Modules accepted: Orders

## 2019-09-20 NOTE — Progress Notes (Signed)
Patient called back with the following blood pressures: 1300 136/77, 1330 138/78, and 1400 135/81. Discussed with Dr. Emelda Fear and patient to keep appointment as scheduled.

## 2019-09-21 ENCOUNTER — Inpatient Hospital Stay (HOSPITAL_COMMUNITY): Admitting: Certified Registered Nurse Anesthetist

## 2019-09-21 ENCOUNTER — Encounter (HOSPITAL_COMMUNITY): Payer: Self-pay | Admitting: Obstetrics and Gynecology

## 2019-09-21 ENCOUNTER — Encounter (HOSPITAL_COMMUNITY)
Admission: AD | Disposition: A | Discharge status: Home / Self Care | Payer: Self-pay | Source: Home / Self Care | Attending: Obstetrics and Gynecology

## 2019-09-21 DIAGNOSIS — O34211 Maternal care for low transverse scar from previous cesarean delivery: Secondary | ICD-10-CM

## 2019-09-21 DIAGNOSIS — O1413 Severe pre-eclampsia, third trimester: Secondary | ICD-10-CM

## 2019-09-21 DIAGNOSIS — O1414 Severe pre-eclampsia complicating childbirth: Secondary | ICD-10-CM | POA: Diagnosis not present

## 2019-09-21 DIAGNOSIS — Z9851 Tubal ligation status: Secondary | ICD-10-CM

## 2019-09-21 DIAGNOSIS — Z3A37 37 weeks gestation of pregnancy: Secondary | ICD-10-CM

## 2019-09-21 DIAGNOSIS — Z302 Encounter for sterilization: Secondary | ICD-10-CM

## 2019-09-21 DIAGNOSIS — O99820 Streptococcus B carrier state complicating pregnancy: Secondary | ICD-10-CM

## 2019-09-21 LAB — CBC
HCT: 31 % — ABNORMAL LOW (ref 36.0–46.0)
Hemoglobin: 10.4 g/dL — ABNORMAL LOW (ref 12.0–15.0)
MCH: 29.7 pg (ref 26.0–34.0)
MCHC: 33.5 g/dL (ref 30.0–36.0)
MCV: 88.6 fL (ref 80.0–100.0)
Platelets: 235 10*3/uL (ref 150–400)
RBC: 3.5 MIL/uL — ABNORMAL LOW (ref 3.87–5.11)
RDW: 13.5 % (ref 11.5–15.5)
WBC: 17.7 10*3/uL — ABNORMAL HIGH (ref 4.0–10.5)
nRBC: 0 % (ref 0.0–0.2)

## 2019-09-21 LAB — PROTEIN / CREATININE RATIO, URINE
Creatinine, Urine: 243.3 mg/dL
Protein, Ur: 101.8 mg/dL
Protein/Creat Ratio: 418 mg/g creat — ABNORMAL HIGH (ref 0–200)

## 2019-09-21 LAB — RPR: RPR Ser Ql: NONREACTIVE

## 2019-09-21 SURGERY — Surgical Case
Anesthesia: Regional | Wound class: Clean Contaminated

## 2019-09-21 MED ORDER — SIMETHICONE 80 MG PO CHEW
80.0000 mg | CHEWABLE_TABLET | ORAL | Status: DC
  Administered 2019-09-22: 80 mg via ORAL
  Filled 2019-09-21 (×2): qty 1

## 2019-09-21 MED ORDER — ONDANSETRON HCL 4 MG/2ML IJ SOLN
4.0000 mg | Freq: Once | INTRAMUSCULAR | Status: AC | PRN
  Administered 2019-09-21: 4 mg via INTRAVENOUS
  Filled 2019-09-21: qty 2

## 2019-09-21 MED ORDER — IBUPROFEN 800 MG PO TABS: 800.0000 mg | ORAL_TABLET | Freq: Three times a day (TID) | ORAL | Status: DC

## 2019-09-21 MED ORDER — DIBUCAINE (PERIANAL) 1 % EX OINT: 1.0000 "application " | TOPICAL_OINTMENT | CUTANEOUS | Status: DC | PRN

## 2019-09-21 MED ORDER — ONDANSETRON HCL 4 MG/2ML IJ SOLN
INTRAMUSCULAR | Status: DC | PRN
  Administered 2019-09-21: 4 mg via INTRAVENOUS

## 2019-09-21 MED ORDER — HYDROCODONE-ACETAMINOPHEN 5-325 MG PO TABS: 1.0000 | ORAL_TABLET | ORAL | Status: DC | PRN

## 2019-09-21 MED ORDER — KETOROLAC TROMETHAMINE 30 MG/ML IJ SOLN
30.0000 mg | Freq: Four times a day (QID) | INTRAMUSCULAR | Status: DC
  Administered 2019-09-21 (×2): 30 mg via INTRAVENOUS
  Filled 2019-09-21 (×2): qty 1

## 2019-09-21 MED ORDER — MAGNESIUM SULFATE 40 GM/1000ML IV SOLN
2.0000 g/h | INTRAVENOUS | Status: AC
  Administered 2019-09-21: 2 g/h via INTRAVENOUS
  Filled 2019-09-21: qty 1000

## 2019-09-21 MED ORDER — NIFEDIPINE ER OSMOTIC RELEASE 30 MG PO TB24
30.0000 mg | ORAL_TABLET | Freq: Every day | ORAL | Status: DC
  Administered 2019-09-21 – 2019-09-23 (×3): 30 mg via ORAL
  Filled 2019-09-21 (×3): qty 1

## 2019-09-21 MED ORDER — OXYTOCIN-SODIUM CHLORIDE 30-0.9 UT/500ML-% IV SOLN: 2.5000 [IU]/h | INTRAVENOUS | Status: AC

## 2019-09-21 MED ORDER — PHENYLEPHRINE HCL-NACL 20-0.9 MG/250ML-% IV SOLN
INTRAVENOUS | Status: DC | PRN
  Administered 2019-09-21: 30 ug/min via INTRAVENOUS

## 2019-09-21 MED ORDER — COCONUT OIL OIL: 1.0000 "application " | TOPICAL_OIL | Status: DC | PRN

## 2019-09-21 MED ORDER — ENOXAPARIN SODIUM 40 MG/0.4ML ~~LOC~~ SOLN
40.0000 mg | SUBCUTANEOUS | Status: DC
  Administered 2019-09-22: 40 mg via SUBCUTANEOUS
  Filled 2019-09-21: qty 0.4

## 2019-09-21 MED ORDER — DIPHENHYDRAMINE HCL 25 MG PO CAPS: 25.0000 mg | ORAL_CAPSULE | Freq: Four times a day (QID) | ORAL | Status: DC | PRN

## 2019-09-21 MED ORDER — IBUPROFEN 800 MG PO TABS
800.0000 mg | ORAL_TABLET | Freq: Three times a day (TID) | ORAL | Status: DC
  Filled 2019-09-21 (×2): qty 1

## 2019-09-21 MED ORDER — OXYTOCIN-SODIUM CHLORIDE 30-0.9 UT/500ML-% IV SOLN
INTRAVENOUS | Status: DC | PRN
  Administered 2019-09-21: 300 mL via INTRAVENOUS

## 2019-09-21 MED ORDER — OXYCODONE HCL 5 MG/5ML PO SOLN: 5.0000 mg | Freq: Once | ORAL | Status: DC | PRN

## 2019-09-21 MED ORDER — FENTANYL CITRATE (PF) 100 MCG/2ML IJ SOLN
INTRAMUSCULAR | Status: DC | PRN
  Administered 2019-09-21: 15 ug via INTRATHECAL

## 2019-09-21 MED ORDER — FENTANYL CITRATE (PF) 100 MCG/2ML IJ SOLN: 25.0000 ug | INTRAMUSCULAR | Status: DC | PRN

## 2019-09-21 MED ORDER — KETOROLAC TROMETHAMINE 30 MG/ML IJ SOLN
INTRAMUSCULAR | Status: AC
  Filled 2019-09-21: qty 1

## 2019-09-21 MED ORDER — SENNOSIDES-DOCUSATE SODIUM 8.6-50 MG PO TABS
2.0000 | ORAL_TABLET | ORAL | Status: DC
  Administered 2019-09-22: 2 via ORAL
  Filled 2019-09-21 (×2): qty 2

## 2019-09-21 MED ORDER — KETOROLAC TROMETHAMINE 30 MG/ML IJ SOLN
30.0000 mg | Freq: Once | INTRAMUSCULAR | Status: AC | PRN
  Administered 2019-09-21: 30 mg via INTRAVENOUS

## 2019-09-21 MED ORDER — PRENATAL MULTIVITAMIN CH: 1.0000 | ORAL_TABLET | Freq: Every day | ORAL | Status: DC

## 2019-09-21 MED ORDER — SODIUM CHLORIDE 0.9 % IV SOLN
8.0000 mg | Freq: Four times a day (QID) | INTRAVENOUS | Status: DC | PRN
  Administered 2019-09-21: 8 mg via INTRAVENOUS
  Filled 2019-09-21 (×2): qty 4

## 2019-09-21 MED ORDER — LACTATED RINGERS IV SOLN: INTRAVENOUS | Status: DC | PRN

## 2019-09-21 MED ORDER — SIMETHICONE 80 MG PO CHEW
80.0000 mg | CHEWABLE_TABLET | Freq: Three times a day (TID) | ORAL | Status: DC
  Administered 2019-09-22: 80 mg via ORAL
  Filled 2019-09-21: qty 1

## 2019-09-21 MED ORDER — SIMETHICONE 80 MG PO CHEW: 80.0000 mg | CHEWABLE_TABLET | ORAL | Status: DC | PRN

## 2019-09-21 MED ORDER — ONDANSETRON HCL 4 MG/2ML IJ SOLN
INTRAMUSCULAR | Status: AC
  Filled 2019-09-21: qty 2

## 2019-09-21 MED ORDER — OXYCODONE HCL 5 MG PO TABS: 5.0000 mg | ORAL_TABLET | Freq: Once | ORAL | Status: DC | PRN

## 2019-09-21 MED ORDER — PANTOPRAZOLE SODIUM 40 MG IV SOLR
40.0000 mg | Freq: Once | INTRAVENOUS | Status: AC
  Administered 2019-09-21: 40 mg via INTRAVENOUS
  Filled 2019-09-21: qty 40

## 2019-09-21 MED ORDER — WITCH HAZEL-GLYCERIN EX PADS: 1.0000 "application " | MEDICATED_PAD | CUTANEOUS | Status: DC | PRN

## 2019-09-21 MED ORDER — MENTHOL 3 MG MT LOZG: 1.0000 | LOZENGE | OROMUCOSAL | Status: DC | PRN

## 2019-09-21 MED ORDER — SCOPOLAMINE 1 MG/3DAYS TD PT72
1.0000 | MEDICATED_PATCH | TRANSDERMAL | Status: DC
  Administered 2019-09-21: 1.5 mg via TRANSDERMAL
  Filled 2019-09-21: qty 1

## 2019-09-21 MED ORDER — DEXAMETHASONE SODIUM PHOSPHATE 4 MG/ML IJ SOLN
INTRAMUSCULAR | Status: AC
  Filled 2019-09-21: qty 1

## 2019-09-21 MED ORDER — AMMONIA AROMATIC IN INHA
RESPIRATORY_TRACT | Status: AC
  Filled 2019-09-21: qty 10

## 2019-09-21 MED ORDER — DEXAMETHASONE SODIUM PHOSPHATE 4 MG/ML IJ SOLN
INTRAMUSCULAR | Status: DC | PRN
Start: 2019-09-21 — End: 2019-09-21
  Administered 2019-09-21: 4 mg via INTRAVENOUS

## 2019-09-21 MED ORDER — BUPIVACAINE IN DEXTROSE 0.75-8.25 % IT SOLN
INTRATHECAL | Status: DC | PRN
  Administered 2019-09-21: 1.6 mL via INTRATHECAL

## 2019-09-21 MED ORDER — MORPHINE SULFATE (PF) 0.5 MG/ML IJ SOLN
INTRAMUSCULAR | Status: DC | PRN
  Administered 2019-09-21: .15 mg via INTRATHECAL

## 2019-09-21 MED ORDER — LACTATED RINGERS IV SOLN: INTRAVENOUS | Status: AC

## 2019-09-21 SURGICAL SUPPLY — 32 items
APL SKNCLS STERI-STRIP NONHPOA (GAUZE/BANDAGES/DRESSINGS) ×1
BENZOIN TINCTURE PRP APPL 2/3 (GAUZE/BANDAGES/DRESSINGS) ×2 IMPLANT
CLOSURE WOUND 1/2 X4 (GAUZE/BANDAGES/DRESSINGS) ×1
CLOTH BEACON ORANGE TIMEOUT ST (SAFETY) ×3 IMPLANT
DRSG OPSITE POSTOP 4X10 (GAUZE/BANDAGES/DRESSINGS) ×3 IMPLANT
DRSG PAD ABDOMINAL 8X10 ST (GAUZE/BANDAGES/DRESSINGS) ×2 IMPLANT
ELECT REM PT RETURN 9FT ADLT (ELECTROSURGICAL) ×3
ELECTRODE REM PT RTRN 9FT ADLT (ELECTROSURGICAL) ×1 IMPLANT
EXTRACTOR VACUUM KIWI (MISCELLANEOUS) IMPLANT
GLOVE BIOGEL PI IND STRL 7.0 (GLOVE) ×1 IMPLANT
GLOVE BIOGEL PI INDICATOR 7.0 (GLOVE) ×2
GLOVE SURG ORTHO 8.0 STRL STRW (GLOVE) ×3 IMPLANT
GOWN STRL REUS W/TWL LRG LVL3 (GOWN DISPOSABLE) ×6 IMPLANT
KIT ABG SYR 3ML LUER SLIP (SYRINGE) IMPLANT
NDL HYPO 25X5/8 SAFETYGLIDE (NEEDLE) IMPLANT
NEEDLE HYPO 25X5/8 SAFETYGLIDE (NEEDLE) IMPLANT
NS IRRIG 1000ML POUR BTL (IV SOLUTION) ×3 IMPLANT
PACK C SECTION WH (CUSTOM PROCEDURE TRAY) ×3 IMPLANT
PAD OB MATERNITY 4.3X12.25 (PERSONAL CARE ITEMS) ×3 IMPLANT
PENCIL SMOKE EVAC W/HOLSTER (ELECTROSURGICAL) ×3 IMPLANT
RTRCTR C-SECT PINK 25CM LRG (MISCELLANEOUS) IMPLANT
STRIP CLOSURE SKIN 1/2X4 (GAUZE/BANDAGES/DRESSINGS) ×1 IMPLANT
SUT CHROMIC 1 CTX 36 (SUTURE) ×6 IMPLANT
SUT MNCRL AB 3-0 PS2 27 (SUTURE) ×3 IMPLANT
SUT PLAIN 0 NONE (SUTURE) IMPLANT
SUT VIC AB 0 CT1 27 (SUTURE) ×6
SUT VIC AB 0 CT1 27XBRD ANBCTR (SUTURE) ×2 IMPLANT
SUT VIC AB 2-0 CT1 27 (SUTURE) ×3
SUT VIC AB 2-0 CT1 TAPERPNT 27 (SUTURE) ×1 IMPLANT
TOWEL OR 17X24 6PK STRL BLUE (TOWEL DISPOSABLE) ×3 IMPLANT
TRAY FOLEY W/BAG SLVR 14FR LF (SET/KITS/TRAYS/PACK) ×3 IMPLANT
WATER STERILE IRR 1000ML POUR (IV SOLUTION) ×3 IMPLANT

## 2019-09-21 NOTE — Lactation Note (Addendum)
Lactation Consultation Note  Patient Name: [name]'H Date: 09/21/2019 Reason for consult: Initial assessment;Early term 37-38.6wks   P2, infant is 37.1 weeks at 71 hours old. 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 Mother reports that she attempt to breastfeed her child for several days but was unsucessful.   Lactation brochure given and basic teaching done.  DEBP was sat up . Mother was taught to hand express. Father was taught to assemble and wash and dry all parts of the pump.    A few drops of colostrum was collected in a colostrum vial.   Mother still on MgSo4 and reports that her head feels very funny. She has a cloth over her face.  Her husband is at the bedside assisting with hand expressing. Infants breastmilk  labels applied to colostrum vial.   Mother taught how to use the pump . mother fit with the #24 flange but was lying back and advised mother to re-evaluate flange size when sitting up . Pumped for 5 mins and observed a few drops on mothers nipple.  Parents very receptive of all teaching.   Advised to pump every 2-3 hours during the day and every 3-4 hours at night.   Mother is aware of available LC services at [facility], BFSG'S, OP Dept, and phone # for questions or concerns about pumping.  Mother receptive to all teaching and plan of care.    Maternal Data Has patient been taught Hand Expression?: Yes Does the patient have breastfeeding experience prior to this delivery?: Yes  Feeding    LATCH Score                   Interventions Interventions: Breast massage;Hand express;Hand pump;DEBP  Lactation Tools Discussed/Used Pump Review: Setup, frequency, and cleaning;Milk Storage Initiated by:: Stevan Born Date initiated:: 09/21/19   Consult Status Consult Status: Follow-up Date: 09/22/19 Follow-up type: In-patient    Stevan Born [facility] 09/21/2019, 3:01 PM

## 2019-09-21 NOTE — Progress Notes (Signed)
Dr. Adrian Blackwater notified @2338  of patient voiding 61mL on second void post catheterization @2245 . Patient output since start of shift 102mL. Orders to remove pressure dressing in order to bladder scan patient. Orders received to strait cath patient if bladder scan value >431mL or give 1L LR bolus in <443mL.

## 2019-09-21 NOTE — Anesthesia Procedure Notes (Signed)
Spinal  Patient location during procedure: OR Staffing Performed: anesthesiologist  Anesthesiologist: Laury Huizar E, MD Preanesthetic Checklist Completed: patient identified, IV checked, risks and benefits discussed, surgical consent, monitors and equipment checked, pre-op evaluation and timeout performed Spinal Block Patient position: sitting Prep: DuraPrep and site prepped and draped Patient monitoring: continuous pulse ox, blood pressure and heart rate Approach: midline Location: L3-4 Injection technique: single-shot Needle Needle type: Pencan  Needle gauge: 24 G Needle length: 9 cm Additional Notes Functioning IV was confirmed and monitors were applied. Sterile prep and drape, including hand hygiene and sterile gloves were used. The patient was positioned and the spine was prepped. The skin was anesthetized with lidocaine.  Free flow of clear CSF was obtained prior to injecting local anesthetic into the CSF. The needle was carefully withdrawn. The patient tolerated the procedure well.      

## 2019-09-21 NOTE — Progress Notes (Signed)
POSTPARTUM PROGRESS NOTE  POD #0  Subjective:  [name] is a 39 y.o. [id number] s/p repeat LTCS with bilateral salpingectomy at [redacted]w[redacted]d.  She reports no pain, but has had bout of nausea throughout the evening. No acute events overnight.  She has not ambulated yet and is still on magnesium sulfate recovery. She has intermittent nausea and vomiting. She has not passed flatus. Pain is well controlled.  Lochia is WNL.  The patient denies headache, visual changes and RUQ pain.  Objective: Blood pressure (!) 147/80, pulse 61, temperature 97.7 F (36.5 C), temperature source Oral, resp. rate 18, height 5\' 1"  (1.549 m), weight 74.4 kg, last menstrual period 01/04/2019, SpO2 98 %.  Physical Exam:  General: alert, cooperative and no distress, obviously nauseated with dry heaves Chest: no respiratory distress Heart:regular rate, distal pulses intact Abdomen: soft, nontender,  Uterine Fundus: firm, appropriately tender DVT Evaluation: No calf swelling or tenderness Extremities: negative edema Skin: warm, dry; incision clean/dry/intact w/ honeycomb dressing in place  Recent Labs    09/20/19 2123 09/21/19 0532  HGB 11.0* 10.4*  HCT 33.6* 31.0*    Assessment/Plan: [name] is a 39 y.o. 40 s/p repeat LTCS with bilateral salpingectomy  at [redacted]w[redacted]d for severe preeclampsia by blood pressure.  POD#0 - postoperative nausea; pain well controlled. H/H appropriate  Routine postpartum care  Magnesium sulfate therapy until 0100  Lovenox for VTE prophylaxis Mild Anemia: asymptomatic  Contraception: previous BTL now with salpingectomy Feeding: ice chips, advance to clears if tolerated  Dispo: Plan for discharge on day 3 if tolerating regular diet and BP well controlled.    [redacted]w[redacted]d, MD FACOG 09/21/2019, 7:35 AM

## 2019-09-21 NOTE — Anesthesia Preprocedure Evaluation (Signed)
Anesthesia Evaluation  Patient identified by MRN, date of birth, ID band Patient awake    Reviewed: Allergy & Precautions, NPO status , Patient's Chart, lab work & pertinent test results  History of Anesthesia Complications Negative for: history of anesthetic complications  Airway Mallampati: II  TM Distance: >3 FB Neck ROM: Full    Dental   Pulmonary asthma ,    Pulmonary exam normal        Cardiovascular hypertension (severe pre-eclampsia), Normal cardiovascular exam     Neuro/Psych negative neurological ROS  negative psych ROS   GI/Hepatic Neg liver ROS, hiatal hernia, GERD  ,Esophageal stricture s/p dilations   Endo/Other  negative endocrine ROS  Renal/GU negative Renal ROS  negative genitourinary   Musculoskeletal negative musculoskeletal ROS (+)   Abdominal   Peds  Hematology negative hematology ROS (+)   Anesthesia Other Findings Prior C/S x2  Reproductive/Obstetrics (+) Pregnancy                             Anesthesia Physical  Anesthesia Plan  ASA: III and emergent  Anesthesia Plan: Spinal   Post-op Pain Management:    Induction:   PONV Risk Score and Plan: 3 and Ondansetron and Treatment may vary due to age or medical condition  Airway Management Planned: Natural Airway  Additional Equipment: None  Intra-op Plan:   Post-operative Plan:   Informed Consent: I have reviewed the patients History and Physical, chart, labs and discussed the procedure including the risks, benefits and alternatives for the proposed anesthesia with the patient or authorized representative who has indicated his/her understanding and acceptance.       Plan Discussed with:   Anesthesia Plan Comments: (Last meal at 6 pm. Requires urgent C section due to severe pre-eclampsia and thus unable to wait full 8 hours for NPO status.)        Anesthesia Quick Evaluation

## 2019-09-21 NOTE — Op Note (Signed)
[name] PROCEDURE DATE: 09/21/2019  PREOPERATIVE DIAGNOSES: Intrauterine pregnancy at [redacted]w[redacted]d weeks gestation; severe preeclampsia and history of two prior cesarean sections  POSTOPERATIVE DIAGNOSES: The same  PROCEDURE: Repeat Low Transverse Cesarean Section, bilateral partial salpingectomy  SURGEON:  Dr. Mariel Aloe  ASSISTANT:  Dr. Merian Capron  ANESTHESIOLOGY TEAM: Anesthesiologist: Lucretia Kern, MD CRNA: Trellis Paganini, CRNA  INDICATIONS: [name] is a 39 y.o. [id number] at [redacted]w[redacted]d here for cesarean section secondary to the indications listed under preoperative diagnoses; please see preoperative note for further details.  The risks of surgery were discussed with the patient including but were not limited to: bleeding which may require transfusion or reoperation; infection which may require antibiotics; injury to bowel, bladder, ureters or other surrounding organs; injury to the fetus; need for additional procedures including hysterectomy in the event of a life-threatening hemorrhage; formation of adhesions; placental abnormalities wth subsequent pregnancies; incisional problems; thromboembolic phenomenon and other postoperative/anesthesia complications.  The patient concurred with the proposed plan, giving informed written consent for the procedure.    FINDINGS:  Viable female infant in cephalic presentation.  Apgars 8 and 9.  Clear amniotic fluid.  Intact placenta, three vessel cord. Minimal adhesive disease. Normal uterus and ovaries bilaterally. Evidence of prior tubal ligation with middle portion missing from bilateral tubes.  ANESTHESIA: Spinal INTRAVENOUS FLUIDS: 1200 ml   ESTIMATED BLOOD LOSS: 300 ml URINE OUTPUT:  150 ml SPECIMENS: Placenta sent to L&D COMPLICATIONS: None immediate  PROCEDURE IN DETAIL:  The patient preoperatively received intravenous antibiotics and had sequential compression devices applied to her lower extremities.  She was then taken  to the operating room where spinal anesthesia was administered in sterile fashion and was found to be adequate. She was then placed in a dorsal supine position with a leftward tilt, and prepped and draped in a sterile manner.  A foley catheter was placed into her bladder and attached to constant gravity.  After an adequate timeout was performed, a Pfannenstiel skin incision was made with scalpel on her preexisting scar and carried through to the underlying layer of fascia. The fascia was incised in the midline, and this incision was extended bilaterally bluntly. Kocher clamps x 2 were applied to the superior aspect of the fascial incision and the underlying rectus muscles were dissected off bluntly and sharply.  A similar process was carried out on the inferior aspect of the fascial incision. The rectus muscles were separated in the midline and the peritoneum was entered bluntly. The Alexis self-retaining retractor was introduced into the abdominal cavity. Attention was turned to the lower uterine segment where a low transverse hysterotomy was made with a scalpel and extended bilaterally bluntly.  The infant was successfully delivered, the cord was clamped and cut after one minute, and the infant was handed over to the awaiting neonatology team. Uterine massage was then administered, and the placenta delivered intact with a three-vessel cord. The uterus was then cleared of clots and debris using manual curettage.  The uterine incision was closed with 1-0 chromic in a running locked fashion, and an imbricating layer was also placed with 1-0 chromic.   Attention was then turned to the fallopian tubes. The remaining distal portion of the left fallopian tube was identified, grasped with a babcock and elevated. A Kelly clamp was then placed across the portion of tube which was then removed sharply with Metzenbaum scissors. The pedicle was ligated with 2-0 Monocryl, and the clamp was removed. Excellent hemostasis was  noted.  A similar process was then carried out on the distal portion of the right fallopian tube. Again excellent hemostasis was noted.   The pelvis was then cleared of all clot and debris with irrigation and suction. Hemostasis was confirmed on all surfaces, including the pedicles of the salpingectomy sites.  The retractor was removed.  The peritoneum was not closed. The fascia was then closed using 0 Vicryl in a running fashion.  The subcutaneous layer was irrigated, reapproximated with 2-0 Vicryl running suture, and the skin was closed with a 4-0 Vicryl subcuticular stitch. The patient tolerated the procedure well. Sponge, instrument and needle counts were correct x 3.  She was taken to the recovery room in stable condition.   Merian Capron MD/[facility] [employer]

## 2019-09-21 NOTE — Transfer of Care (Signed)
Immediate Anesthesia Transfer of Care Note  Patient: [name]  Procedure(s) Performed: CESAREAN SECTION WITH BILATERAL TUBAL LIGATION (N/A )  Patient Location: PACU  Anesthesia Type:Spinal  Level of Consciousness: awake, alert  and patient cooperative  Airway & Oxygen Therapy: Patient Spontanous Breathing  Post-op Assessment: Report given to RN and Post -op Vital signs reviewed and stable  Post vital signs: Reviewed and stable  Last Vitals:  Vitals Value Taken Time  BP 151/82 09/21/19 0215  Temp 37.8 C 09/21/19 0200  Pulse 89 09/21/19 0221  Resp 21 09/21/19 0221  SpO2 95 % 09/21/19 0221  Vitals shown include unvalidated device data.  Last Pain:  Vitals:   09/21/19 0200  TempSrc:   PainSc: 0-No pain         Complications: No complications documented.

## 2019-09-21 NOTE — Discharge Summary (Signed)
Postpartum Discharge Summary  Date of Service updated 09/23/2019      Patient Name: [name] DOB: 08-02-1980 MRN: [id number]  Date of admission: 09/20/2019 Delivery date:09/21/2019  Delivering provider: Lynnda Shields A  Date of discharge: 09/23/2019  Admitting diagnosis: Preeclampsia, severe [O14.10] Intrauterine pregnancy: [redacted]w[redacted]d    Secondary diagnosis:  Active Problems:   Mild intermittent asthma   Family history of colon cancer in father   Pregnant state, gestational carrier   Supervision of high risk pregnancy, antepartum   AMA (advanced maternal age) multigravida 35+   Preeclampsia, severe   History of bilateral tubal ligation  Additional problems: NONE    Discharge diagnosis: Term Pregnancy Delivered and Preeclampsia (severe)                                              Post partum procedures:postpartum tubal ligation Augmentation: N/A Complications: None  Hospital course: Sceduled C/S   39y.o. yo G3P2002 at 37w1das admitted to the hospital 09/20/2019 for scheduled cesarean section with the following indication:pre-eclampsia with severe features, history of two prior cesareans.Delivery details are as follows: seen in office on day of admission with mild range BP's, on arrival to MAU had multiple severe range BP's as well as labs consistent with pre-eclampsia with severe features.  Membrane Rupture Time/Date: 1:00 AM ,09/21/2019   Delivery Method:C-Section, Low Transverse  Details of operation can be found in separate operative note.  Patient had an uncomplicated postpartum course. BP's were UNDER CONTROL. She is ambulating, tolerating a regular diet, passing flatus, and urinating well. Patient is discharged home in stable condition on  09/23/19        Newborn Data: Birth date:09/21/2019  Birth time:1:00 AM  Gender:Female  Living status:Living  Apgars:8 ,9  Weight:2495 g     Magnesium Sulfate received: Yes: Seizure prophylaxis BMZ received:  No Rhophylac:N/A MMR:N/A T-DaP:Given prenatally Flu: No Transfusion:No  Physical exam  Vitals:   09/22/19 1633 09/22/19 1730 09/22/19 1933 09/23/19 0303  BP: 128/71 125/69 137/71 120/65  Pulse: 99 90 98 90  Resp: _0 Temp: 98.2 F (36.8 C)  98.4 F (36.9 C) 98.6 F (37 C)  TempSrc: Oral  Oral Oral  SpO2: 98%  97% 100%  Weight:      Height:       General: alert, cooperative and no distress Lochia: appropriate Uterine Fundus: firm Incision: Healing well with no significant drainage DVT Evaluation: No evidence of DVT seen on physical exam. Labs: Lab Results  Component Value Date   WBC 17.7 (H) 09/21/2019   HGB 10.4 (L) 09/21/2019   HCT 31.0 (L) 09/21/2019   MCV 88.6 09/21/2019   PLT 235 09/21/2019   CMP Latest Ref Rng & Units 09/20/2019  Glucose 70 - 99 mg/dL 78  BUN 6 - 20 mg/dL 11  Creatinine 0.44 - 1.00 mg/dL 0.60  Sodium 135 - 145 mmol/L 136  Potassium 3.5 - 5.1 mmol/L 3.7  Chloride 98 - 111 mmol/L 108  CO2 22 - 32 mmol/L 20(L)  Calcium 8.9 - 10.3 mg/dL 8.8(L)  Total Protein 6.5 - 8.1 g/dL 5.6(L)  Total Bilirubin 0.3 - 1.2 mg/dL 0.4  Alkaline Phos 38 - 126 U/L 299(H)  AST 15 - 41 U/L 17  ALT 0 - 44 U/L 10   Edinburgh Score: No flowsheet data found.   After visit  meds:  Allergies as of 09/23/2019      Reactions   Doxycycline Rash   Codeine Nausea And Vomiting   Sucralfate Nausea And Vomiting   Augmentin [amoxicillin-pot Clavulanate]    Pt states she recently took amoxicillin without incident, but cannot take the combination in Augmentin.   Has patient had a PCN reaction causing immediate rash, facial/tongue/throat swelling, SOB or lightheadedness with hypotension: No Has patient had a PCN reaction causing severe rash involving mucus membranes or skin necrosis: No Has patient had a PCN reaction that required hospitalization No Has patient had a PCN reaction occurring within the last 10 years: No If all of the above answers are "NO   [place] Other  (See Comments)   dizziness      Medication List    STOP taking these medications   acetaminophen 500 MG tablet Commonly known as: TYLENOL   albuterol 108 (90 Base) MCG/ACT inhaler Commonly known as: VENTOLIN HFA   Bonjesta 20-20 MG Tbcr Generic drug: Doxylamine-Pyridoxine ER     TAKE these medications   Flinstones Gummies Omega-3 DHA Chew Chew 2 each by mouth daily.   HYDROcodone-acetaminophen 5-325 MG tablet Commonly known as: NORCO/VICODIN Take 1-2 tablets by mouth every 4 (four) hours as needed for moderate pain.   ibuprofen 800 MG tablet Commonly known as: ADVIL Take 1 tablet (800 mg total) by mouth every 8 (eight) hours.   NIFEdipine 60 MG 24 hr tablet Commonly known as: ADALAT CC Take 1 tablet (60 mg total) by mouth daily.        Discharge home in stable condition Infant Feeding: Bottle and Breast Infant Disposition:surrogate pregnancy Discharge instruction: per After Visit Summary and Postpartum booklet. Activity: Advance as tolerated. Pelvic rest for 6 weeks.  Diet: routine diet Future Appointments: Future Appointments  Date Time Provider [place]  09/25/2019 11:30 AM [facility] [place] [facility] None  09/25/2019 12:15 PM Florian Buff, MD [facility]  09/28/2019  9:50 AM [facility] NURSE [facility]  10/02/2019 11:15 AM [facility] [place] [facility] None  10/02/2019 12:00 PM Florian Buff, MD [facility]  10/03/2019  8:10 AM [facility] 1 [facility] None   Follow up Visit:  Follow-up Information    [facility] Follow up in 1 week(s).   Specialty: Obstetrics and Gynecology Why: BP Contact information: [address] [place] [phone]               Please schedule this patient for a In person postpartum visit in 6 weeks with the following provider: Any provider. Additional Postpartum F/U:Incision check 1 week and BP check 1 week  High risk pregnancy complicated by: Pre-eclampsia with severe  features Delivery mode:  C-Section, Low Transverse  Anticipated Birth Control:  BTL   09/23/2019 Emeterio Reeve, MD

## 2019-09-21 NOTE — Anesthesia Postprocedure Evaluation (Signed)
Anesthesia Post Note  Patient: Sharnika R Merk  Procedure(s) Performed: CESAREAN SECTION WITH BILATERAL TUBAL LIGATION (N/A )     Patient location during evaluation: PACU Anesthesia Type: Spinal Level of consciousness: oriented and awake and alert Pain management: pain level controlled Vital Signs Assessment: post-procedure vital signs reviewed and stable Respiratory status: spontaneous breathing, respiratory function stable and nonlabored ventilation Cardiovascular status: blood pressure returned to baseline and stable Postop Assessment: no headache, no backache, no apparent nausea or vomiting and spinal receding Anesthetic complications: no   No complications documented.  Last Vitals:  Vitals:   09/21/19 0345 09/21/19 0427  BP: (!) 149/82 (!) 155/76  Pulse: 77 62  Resp: 18   Temp: 36.5 C   SpO2: 98%     Last Pain:  Vitals:   09/21/19 0345  TempSrc: Oral  PainSc: 0-No pain   Pain Goal:                   Lucretia Kern

## 2019-09-22 ENCOUNTER — Encounter (HOSPITAL_COMMUNITY): Payer: Self-pay | Admitting: Obstetrics and Gynecology

## 2019-09-22 ENCOUNTER — Telehealth: Payer: Self-pay | Admitting: Obstetrics and Gynecology

## 2019-09-22 MED ORDER — LACTATED RINGERS IV BOLUS
1000.0000 mL | Freq: Once | INTRAVENOUS | Status: AC
  Administered 2019-09-22: 1000 mL via INTRAVENOUS

## 2019-09-22 MED ORDER — ACETAMINOPHEN 325 MG PO TABS
650.0000 mg | ORAL_TABLET | Freq: Four times a day (QID) | ORAL | Status: DC | PRN
  Administered 2019-09-22 – 2019-09-23 (×4): 650 mg via ORAL
  Filled 2019-09-22 (×4): qty 2

## 2019-09-22 NOTE — Progress Notes (Signed)
CSW acknowledged consult and attempted to meet with surrogate. However, pt was not in room, door was open/room empty, nurse light on. CSW will meet with pt at a later time.  Rithik Odea D. Hanya Guerin, MSW, LCSWA Clinical Social Worker [phone]   

## 2019-09-22 NOTE — Progress Notes (Signed)
Bladder scan performed @0000 . Bladder volume 56mL. LR bolus started.

## 2019-09-22 NOTE — Progress Notes (Signed)
Subjective: Postpartum Day 1: Cesarean Delivery Patient reports incisional pain, tolerating PO and no problems voiding.    Objective: Vital signs in last 24 hours: Temp:  [96.1 F (35.6 C)-98.3 F (36.8 C)] 98.3 F (36.8 C) (07/02 1950) Pulse Rate:  [66-91] 91 (07/03 0415) Resp:  [17-20] 20 (07/03 0415) BP: (132-152)/(73-90) 139/73 (07/03 0415) SpO2:  [92 %-100 %] 92 % (07/03 0415)  Physical Exam:  General: alert, cooperative and no distress Lochia: appropriate Uterine Fundus: firm Incision: healing well, no significant drainage DVT Evaluation: No evidence of DVT seen on physical exam. Negative Homan's sign. No cords or calf tenderness.  Recent Labs    09/20/19 2123 09/21/19 0532  HGB 11.0* 10.4*  HCT 33.6* 31.0*    Assessment/Plan: Status post Cesarean section. Doing well postoperatively.  Magnesium d/c at 1am.  Watch BP during day today. If stable, d/c this evening vs tomorrow AM.  Rhona Raider [facility] 09/22/2019, 6:46 AM

## 2019-09-22 NOTE — Progress Notes (Signed)
CSW received consult for hx of Anxiety and Depression.  CSW met with surrogate at bedisde to offer support and complete assessment. On arrival, CSW introduced self and stated reason for visit.  Patient's husband was in the room sleeping, however, patient gave permission to continue visit and complete assessment in husband's presence. MOB was pleasant and engaged during visit.  CSW provided education regarding the baby blues period vs. perinatal mood disorders, discussed treatment and gave resources for mental health follow up if concerns arise.  CSW recommends self-evaluation during the postpartum time period using the New Mom Checklist from Postpartum Progress and encouraged MOB to contact a medical professional if symptoms are noted at any time. MOB denied any questions or PPD/A history with other children. MOB denied any SI, HI, or domestic violence. MOB reported history of depression and anxiety, however, denies any sx in over five years. MOB reported sx as feeling overwhelmed. MOB denies any active Rx for behavior sx and stated husband is her support. MOB declined any resources or support at this time and stated she is doing well.     CSW identifies no further need for intervention and no barriers to discharge at this time.  Stephan Nelis D. Lissa Morales, MSW, [facility] Clinical Social Worker [phone]

## 2019-09-22 NOTE — Telephone Encounter (Signed)
Patient contacted, she had an elevated pressure 7/1 a few hours after we talked, and seh went in to MAU, had elevated pressures, and went to OR that pm. Pecola Leisure has gone already to [facility] to join the Surrogacy family

## 2019-09-22 NOTE — Lactation Note (Signed)
Lactation Consultation Note  Patient Name: [name] Date: 09/22/2019   Mom reports pumping isn't working.  Urged to pump 8-12 times day even when not getting anything.  Reminded parents frequency of pumping to help milk come,  Urged to call lactation as needed.   Maternal Data    Feeding    LATCH Score                   Interventions    Lactation Tools Discussed/Used     Consult Status      Deirdre Gryder Michaelle Copas 09/22/2019, 7:33 PM

## 2019-09-23 MED ORDER — NIFEDIPINE ER 30 MG PO TB24: 30.0000 mg | ORAL_TABLET | Freq: Every day | ORAL | 1 refills | Status: DC

## 2019-09-23 MED ORDER — NIFEDIPINE ER 60 MG PO TB24: 60.0000 mg | ORAL_TABLET | Freq: Every day | ORAL | 1 refills | Status: DC

## 2019-09-23 MED ORDER — HYDROCODONE-ACETAMINOPHEN 5-325 MG PO TABS: 1.0000 | ORAL_TABLET | ORAL | 0 refills | Status: DC | PRN

## 2019-09-23 MED ORDER — IBUPROFEN 800 MG PO TABS: 800.0000 mg | ORAL_TABLET | Freq: Three times a day (TID) | ORAL | 0 refills | Status: DC

## 2019-09-23 NOTE — Discharge Instructions (Signed)
Cesarean Delivery, Care After This sheet gives you information about how to care for yourself after your procedure. Your health care provider may also give you more specific instructions. If you have problems or questions, contact your health care provider. What can I expect after the procedure? After the procedure, it is common to have:  A small amount of blood or clear fluid coming from the incision.  Some redness, swelling, and pain in your incision area.  Some abdominal pain and soreness.  Vaginal bleeding (lochia). Even though you did not have a vaginal delivery, you will still have vaginal bleeding and discharge.  Pelvic cramps.  Fatigue. You may have pain, swelling, and discomfort in the tissue between your vagina and your anus (perineum) if:  Your C-section was unplanned, and you were allowed to labor and push.  An incision was made in the area (episiotomy) or the tissue tore during attempted vaginal delivery. Follow these instructions at home: Incision care   Follow instructions from your health care provider about how to take care of your incision. Make sure you: ? Wash your hands with soap and water before you change your bandage (dressing). If soap and water are not available, use hand sanitizer. ? If you have a dressing, change it or remove it as told by your health care provider. ? Leave stitches (sutures), skin staples, skin glue, or adhesive strips in place. These skin closures may need to stay in place for 2 weeks or longer. If adhesive strip edges start to loosen and curl up, you may trim the loose edges. Do not remove adhesive strips completely unless your health care provider tells you to do that.  Check your incision area every day for signs of infection. Check for: ? More redness, swelling, or pain. ? More fluid or blood. ? Warmth. ? Pus or a bad smell.  Do not take baths, swim, or use a hot tub until your health care provider says it's okay. Ask your health  care provider if you can take showers.  When you cough or sneeze, hug a pillow. This helps with pain and decreases the chance of your incision opening up (dehiscing). Do this until your incision heals. Medicines  Take over-the-counter and prescription medicines only as told by your health care provider.  If you were prescribed an antibiotic medicine, take it as told by your health care provider. Do not stop taking the antibiotic even if you start to feel better.  Do not drive or use heavy machinery while taking prescription pain medicine. Lifestyle  Do not drink alcohol. This is especially important if you are breastfeeding or taking pain medicine.  Do not use any products that contain nicotine or tobacco, such as cigarettes, e-cigarettes, and chewing tobacco. If you need help quitting, ask your health care provider. Eating and drinking  Drink at least 8 eight-ounce glasses of water every day unless told not to by your health care provider. If you breastfeed, you may need to drink even more water.  Eat high-fiber foods every day. These foods may help prevent or relieve constipation. High-fiber foods include: ? Whole grain cereals and breads. ? Brown rice. ? Beans. ? Fresh fruits and vegetables. Activity   If possible, have someone help you care for your baby and help with household activities for at least a few days after you leave the hospital.  Return to your normal activities as told by your health care provider. Ask your health care provider what activities are safe for   you.  Rest as much as possible. Try to rest or take a nap while your baby is sleeping.  Do not lift anything that is heavier than 10 lbs (4.5 kg), or the limit that you were told, until your health care provider says that it is safe.  Talk with your health care provider about when you can engage in sexual activity. This may depend on your: ? Risk of infection. ? How fast you heal. ? Comfort and desire to  engage in sexual activity. General instructions  Do not use tampons or douches until your health care provider approves.  Wear loose, comfortable clothing and a supportive and well-fitting bra.  Keep your perineum clean and dry. Wipe from front to back when you use the toilet.  If you pass a blood clot, save it and call your health care provider to discuss. Do not flush blood clots down the toilet before you get instructions from your health care provider.  Keep all follow-up visits for you and your baby as told by your health care provider. This is important. Contact a health care provider if:  You have: ? A fever. ? Bad-smelling vaginal discharge. ? Pus or a bad smell coming from your incision. ? Difficulty or pain when urinating. ? A sudden increase or decrease in the frequency of your bowel movements. ? More redness, swelling, or pain around your incision. ? More fluid or blood coming from your incision. ? A rash. ? Nausea. ? Little or no interest in activities you used to enjoy. ? Questions about caring for yourself or your baby.  Your incision feels warm to the touch.  Your breasts turn red or become painful or hard.  You feel unusually sad or worried.  You vomit.  You pass a blood clot from your vagina.  You urinate more than usual.  You are dizzy or light-headed. Get help right away if:  You have: ? Pain that does not go away or get better with medicine. ? Chest pain. ? Difficulty breathing. ? Blurred vision or spots in your vision. ? Thoughts about hurting yourself or your baby. ? New pain in your abdomen or in one of your legs. ? A severe headache.  You faint.  You bleed from your vagina so much that you fill more than one sanitary pad in one hour. Bleeding should not be heavier than your heaviest period. Summary  After the procedure, it is common to have pain at your incision site, abdominal cramping, and slight bleeding from your vagina.  Check  your incision area every day for signs of infection.  Tell your health care provider about any unusual symptoms.  Keep all follow-up visits for you and your baby as told by your health care provider. This information is not intended to replace advice given to you by your health care provider. Make sure you discuss any questions you have with your health care provider. Document Revised: 09/14/2017 Document Reviewed: 09/14/2017 Elsevier Patient Education  2020 [employer]  

## 2019-09-25 ENCOUNTER — Encounter: Admitting: Obstetrics & Gynecology

## 2019-09-25 ENCOUNTER — Other Ambulatory Visit: Admitting: Obstetrics & Gynecology

## 2019-09-25 ENCOUNTER — Other Ambulatory Visit

## 2019-09-28 ENCOUNTER — Other Ambulatory Visit

## 2019-09-28 ENCOUNTER — Ambulatory Visit (INDEPENDENT_AMBULATORY_CARE_PROVIDER_SITE_OTHER): Admitting: Obstetrics & Gynecology

## 2019-09-28 ENCOUNTER — Encounter: Payer: Self-pay | Admitting: Obstetrics & Gynecology

## 2019-09-28 VITALS — BP 135/83 | HR 117 | Ht 61.0 in | Wt 141.0 lb

## 2019-09-28 DIAGNOSIS — Z98891 History of uterine scar from previous surgery: Secondary | ICD-10-CM

## 2019-09-28 NOTE — Progress Notes (Signed)
  HPI: Patient returns for routine postoperative follow-up having undergone repeat c section on 09/21/19.  The patient's immediate postoperative recovery has been unremarkable. Since hospital discharge the patient reports right.   Current Outpatient Medications: ibuprofen (ADVIL) 800 MG tablet, Take 1 tablet (800 mg total) by mouth every 8 (eight) hours., Disp: 30 tablet, Rfl: 0 NIFEdipine (ADALAT CC) 60 MG 24 hr tablet, Take 1 tablet (60 mg total) by mouth daily., Disp: 30 tablet, Rfl: 1 Pediatric Multiple Vit-C-FA (FLINSTONES GUMMIES OMEGA-3 DHA) CHEW, Chew 2 each by mouth daily. , Disp: , Rfl:   No current facility-administered medications for this visit.    Blood pressure 135/83, pulse (!) 117, height 5\' 1"  (1.549 m), weight 141 lb (64 kg), last menstrual period 01/04/2019, currently breastfeeding.  Physical Exam: Incision clean dry intact Abdomen is soft normal post op  Diagnostic Tests:   Pathology:   Impression: S/p repeat c section/BTL  Plan:   Follow up: 5  weeks  01/06/2019, MD

## 2019-10-02 ENCOUNTER — Encounter: Admitting: Obstetrics & Gynecology

## 2019-10-02 ENCOUNTER — Other Ambulatory Visit

## 2019-10-03 ENCOUNTER — Other Ambulatory Visit (HOSPITAL_COMMUNITY): Admission: RE | Admit: 2019-10-03 | Source: Ambulatory Visit

## 2019-10-05 ENCOUNTER — Inpatient Hospital Stay (HOSPITAL_COMMUNITY): Admit: 2019-10-05 | Admitting: Obstetrics & Gynecology

## 2019-10-30 ENCOUNTER — Encounter: Payer: Self-pay | Admitting: Obstetrics & Gynecology

## 2019-11-05 ENCOUNTER — Encounter: Payer: Self-pay | Admitting: Obstetrics and Gynecology

## 2019-11-05 ENCOUNTER — Ambulatory Visit (INDEPENDENT_AMBULATORY_CARE_PROVIDER_SITE_OTHER): Admitting: Obstetrics and Gynecology

## 2019-11-05 ENCOUNTER — Other Ambulatory Visit: Payer: Self-pay

## 2019-11-05 VITALS — BP 138/85 | HR 55 | Ht 61.0 in | Wt 137.2 lb

## 2019-11-05 DIAGNOSIS — Z98891 History of uterine scar from previous surgery: Secondary | ICD-10-CM

## 2019-11-05 DIAGNOSIS — Z09 Encounter for follow-up examination after completed treatment for conditions other than malignant neoplasm: Secondary | ICD-10-CM

## 2019-11-05 NOTE — Progress Notes (Signed)
Patient ID: [name], female   DOB: [birth year], 39 y.o.   MRN: [id number]     Subjective:  [name] is a 39 y.o. female now 6 weeks status post C section and tubal ligation. She delivered at [redacted]w[redacted]d due to severe pre-eclampsia.   Review of Systems Negative   Diet:   negative   Bowel movements: normal.  Objective:  BP 138/85 (BP Location: Right Arm, Patient Position: Sitting, Cuff Size: Normal)   Pulse (!) 55   Ht 5\' 1"  (1.549 m)   Wt 137 lb 3.2 oz (62.2 kg)   LMP 01/04/2019 (Exact Date)   Breastfeeding No   BMI 25.92 kg/m  General:Well developed, well nourished.  No acute distress. Abdomen: Bowel sounds normal, soft, non-tender. Pelvic Exam: DEFERRED  Incision(s): Healing well, no drainage, no erythema, no hernia, no swelling, no dehiscence stitch removed LLQ   Assessment:  Post-Op 6 weeks s/p c section and tubal ligation.  Doing well postoperatively.   Plan:  1. Wound care discussed 2.   Current medications: unchanged 3.   Activity restrictions: no overhead lifting 4.   Return to work: not applicable. 5.   Follow up prn  By signing my name below, I, 01/06/2019, attest that this documentation has been prepared under the direction and in the presence of Pietro Cassis, MD. Electronically Signed: [name], Medical Scribe. 11/05/19. 11:01 AM.  I personally performed the services described in this documentation, which was SCRIBED in my presence. The recorded information has been reviewed and considered accurate. It has been edited as necessary during review. 11/07/19, MD

## 2019-11-08 ENCOUNTER — Other Ambulatory Visit: Payer: Self-pay | Admitting: Obstetrics and Gynecology

## 2019-11-08 DIAGNOSIS — Z1231 Encounter for screening mammogram for malignant neoplasm of breast: Secondary | ICD-10-CM

## 2019-12-27 ENCOUNTER — Ambulatory Visit
Admission: RE | Admit: 2019-12-27 | Discharge: 2019-12-27 | Disposition: A | Discharge status: Home / Self Care | Source: Ambulatory Visit | Attending: Obstetrics and Gynecology | Admitting: Obstetrics and Gynecology

## 2019-12-27 ENCOUNTER — Other Ambulatory Visit: Payer: Self-pay

## 2019-12-27 DIAGNOSIS — Z1231 Encounter for screening mammogram for malignant neoplasm of breast: Secondary | ICD-10-CM

## 2020-05-07 ENCOUNTER — Other Ambulatory Visit: Admitting: Women's Health

## 2020-06-17 ENCOUNTER — Other Ambulatory Visit: Admitting: Women's Health

## 2020-06-28 ENCOUNTER — Other Ambulatory Visit: Payer: Self-pay

## 2020-06-28 ENCOUNTER — Ambulatory Visit
Admission: EM | Admit: 2020-06-28 | Discharge: 2020-06-28 | Disposition: A | Discharge status: Home / Self Care | Attending: Family Medicine | Admitting: Family Medicine

## 2020-06-28 DIAGNOSIS — J029 Acute pharyngitis, unspecified: Secondary | ICD-10-CM | POA: Diagnosis present

## 2020-06-28 LAB — POCT RAPID STREP A (OFFICE): Rapid Strep A Screen: NEGATIVE

## 2020-06-28 NOTE — ED Provider Notes (Signed)
[facility]   [id number] 06/28/20 Arrival Time: 8756  ASSESSMENT & PLAN:  1. Sore throat    No signs of peritonsillar abscess. Discussed.   Discharge Instructions      You may use over the counter ibuprofen or acetaminophen as needed.   For a sore throat, over the counter products such as [employer] Peroxyl Mouth Sore Rinse or Chloraseptic Sore Throat Spray may provide some temporary relief.  Your rapid strep test was negative today. We have sent your throat swab for culture and will let you know of any positive results.     Results for orders placed or performed during the hospital encounter of 06/28/20  POCT rapid strep A  Result Value Ref Range   Rapid Strep A Screen Negative Negative   Labs Reviewed  CULTURE, GROUP A STREP [facility])  POCT RAPID STREP A (OFFICE)    OTC analgesics and throat care as needed  Reviewed expectations re: course of current medical issues. Questions answered. Outlined signs and symptoms indicating need for more acute intervention. Patient verbalized understanding. After Visit Summary given.   SUBJECTIVE:  [name] is a 40 y.o. female who reports a sore throat along with mild hoarseness. Painful swallowing. Onset abrupt beginning yesterday. Symptoms have progressed to a point and plateaued since beginning. No respiratory symptoms. Normal PO intake but reports discomfort with swallowing. No specific alleviating factors. Fever: absent. No neck pain or swelling. No associated nausea, vomiting, or abdominal pain. Known sick contacts: none. Recent travel: none. OTC treatment: none reported.    OBJECTIVE:  Vitals:   06/28/20 0930  BP: 120/86  Pulse: 82  Resp: 18  Temp: 97.8 F (36.6 C)  SpO2: 96%     General appearance: alert; no distress HEENT: throat with mild erythema and cobblestoning; uvula is midline Neck: supple with FROM; no lymphadenopathy Lungs: speaks full sentences without difficulty; unlabored Abd: soft;  non-tender Skin: reveals no rash; warm and dry Psychological: alert and cooperative; normal mood and affect  Allergies  Allergen Reactions  . Doxycycline Rash  . Codeine Nausea And Vomiting  . Sucralfate Nausea And Vomiting  . Augmentin [Amoxicillin-Pot Clavulanate]     Pt states she recently took amoxicillin without incident, but cannot take the combination in Augmentin.   Has patient had a PCN reaction causing immediate rash, facial/tongue/throat swelling, SOB or lightheadedness with hypotension: No Has patient had a PCN reaction causing severe rash involving mucus membranes or skin necrosis: No Has patient had a PCN reaction that required hospitalization No Has patient had a PCN reaction occurring within the last 10 years: No If all of the above answers are "NO  . Red Dye Other (See Comments)    dizziness    Past Medical History:  Diagnosis Date  . Asthma    prn inhaler  . Finger mass, right 03/2013   index finger  . GERD (gastroesophageal reflux disease)   . H/O hiatal hernia   . History of esophageal stricture    multiple esophageal dilations - states x 12  . Migraine   . PONV (postoperative nausea and vomiting)    nausea only   Social History   Socioeconomic History  . Marital status: Married    Spouse name: Not on file  . Number of children: 2  . Years of education: Not on file  . Highest education level: Not on file  Occupational History  . Not on file  Tobacco Use  . Smoking status: Never Smoker  . Smokeless  tobacco: Never Used  Vaping Use  . Vaping Use: Never used  Substance and Sexual Activity  . Alcohol use: No  . Drug use: No  . Sexual activity: Not Currently    Birth control/protection: Surgical    Comment: tubal  Other Topics Concern  . Not on file  Social History Narrative  . Not on file   Social Determinants of Health   Financial Resource Strain: Low Risk   . Difficulty of Paying Living Expenses: Not hard at all  Food Insecurity: No Food  Insecurity  . Worried About Programme researcher, broadcasting/film/video in the Last Year: Never true  . Ran Out of Food in the Last Year: Never true  Transportation Needs: No Transportation Needs  . Lack of Transportation (Medical): No  . Lack of Transportation (Non-Medical): No  Physical Activity: Insufficiently Active  . Days of Exercise per Week: 3 days  . Minutes of Exercise per Session: 30 min  Stress: No Stress Concern Present  . Feeling of Stress : Not at all  Social Connections: Moderately Integrated  . Frequency of Communication with Friends and Family: More than three times a week  . Frequency of Social Gatherings with Friends and Family: More than three times a week  . Attends Religious Services: Never  . Active Member of Clubs or Organizations: Yes  . Attends Banker Meetings: More than 4 times per year  . Marital Status: Married  Catering manager Violence: Not At Risk  . Fear of Current or Ex-Partner: No  . Emotionally Abused: No  . Physically Abused: No  . Sexually Abused: No   Family History  Problem Relation Age of Onset  . Anesthesia problems Mother        post-op nausea  . Colon cancer Father           Mardella Layman, MD 06/28/20 1000

## 2020-06-28 NOTE — ED Triage Notes (Signed)
Pt presents with c/o sore throat and hoarseness that began yesterday  

## 2020-06-28 NOTE — Discharge Instructions (Addendum)
You may use over the counter ibuprofen or acetaminophen as needed.  For a sore throat, over the counter products such as Colgate Peroxyl Mouth Sore Rinse or Chloraseptic Sore Throat Spray may provide some temporary relief. Your rapid strep test was negative today. We have sent your throat swab for culture and will let you know of any positive results. 

## 2020-07-01 LAB — CULTURE, GROUP A STREP (THRC)

## 2020-07-28 ENCOUNTER — Ambulatory Visit
Admission: EM | Admit: 2020-07-28 | Discharge: 2020-07-28 | Disposition: A | Discharge status: Home / Self Care | Attending: Emergency Medicine | Admitting: Emergency Medicine

## 2020-07-28 ENCOUNTER — Encounter: Payer: Self-pay | Admitting: *Deleted

## 2020-07-28 ENCOUNTER — Other Ambulatory Visit: Payer: Self-pay

## 2020-07-28 DIAGNOSIS — J019 Acute sinusitis, unspecified: Secondary | ICD-10-CM

## 2020-07-28 MED ORDER — AMOXICILLIN 875 MG PO TABS: 875.0000 mg | ORAL_TABLET | Freq: Two times a day (BID) | ORAL | 0 refills | Status: AC

## 2020-07-28 MED ORDER — PREDNISONE 20 MG PO TABS: 40.0000 mg | ORAL_TABLET | Freq: Every day | ORAL | 0 refills | Status: AC

## 2020-07-28 NOTE — ED Triage Notes (Signed)
Patient presents c/o ongoing symptoms of sore throat, congestion, ear pain since last visit.  Reports sinus congestion and sinus pain.  Denies fever, chills, body aches.

## 2020-07-28 NOTE — ED Provider Notes (Signed)
[name]    CSN: [id number] Arrival date & time: 07/28/20  1158      History   Chief Complaint Chief Complaint  Patient presents with  . Sore Throat  . Nasal Congestion    HPI [name] is a 40 y.o. female history of asthma presenting today for evaluation of sinus pressure and congestion.  Reports that symptoms have been going on for couple weeks to 1 month.  Initially was seen here early April, reports symptoms resolved but then returned again.  Denies fevers chills or body aches.  HPI  Past Medical History:  Diagnosis Date  . Asthma    prn inhaler  . Finger mass, right 03/2013   index finger  . GERD (gastroesophageal reflux disease)   . H/O hiatal hernia   . History of esophageal stricture    multiple esophageal dilations - states x 12  . Migraine   . PONV (postoperative nausea and vomiting)    nausea only    Patient Active Problem List   Diagnosis Date Noted  . History of bilateral tubal ligation 09/21/2019  . Preeclampsia, severe 09/20/2019  . History of abnormal cervical Pap smear 08/07/2019  . Previous cesarean section 07/09/2019  . AMA (advanced maternal age) multigravida 35+ 07/09/2019  . Pregnant state, gestational carrier 03/19/2019  . Supervision of high risk pregnancy, antepartum 03/19/2019  . 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 Family history of colon cancer in father 05/17/2018  . Anxiety and depression 11/30/2011  . 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 EE (eosinophilic esophagitis) 07/26/2011  . Mild intermittent asthma 07/26/2011  . ASTHMA 11/14/2008  . GERD 11/14/2008  . DYSPHAGIA UNSPECIFIED 11/14/2008    Past Surgical History:  Procedure Laterality Date  . BALLOON DILATION  06/16/2011   Procedure: BALLOON DILATION;  Surgeon: Willis Modena, MD;  Location: [facility];  Service: Endoscopy;  Laterality: N/A;  . BALLOON DILATION  07/07/2011   Procedure: BALLOON DILATION;   Surgeon: Willis Modena, MD;  Location: [facility];  Service: Endoscopy;  Laterality: N/A;  . BALLOON DILATION  07/28/2011   Procedure: BALLOON DILATION;  Surgeon: Willis Modena, MD;  Location: [facility];  Service: Endoscopy;  Laterality: N/A;  . CESAREAN SECTION  1999; 02/07/2003  . CESAREAN SECTION WITH BILATERAL TUBAL LIGATION N/A 09/21/2019   Procedure: CESAREAN SECTION WITH BILATERAL TUBAL LIGATION;  Surgeon: Warden Fillers, MD;  Location: [facility];  Service: Obstetrics;  Laterality: N/A;  . ESOPHAGOGASTRODUODENOSCOPY  07/07/2011  . ESOPHAGOGASTRODUODENOSCOPY  06/16/2011   Procedure: ESOPHAGOGASTRODUODENOSCOPY (EGD);  Surgeon: Willis Modena, MD;  Location: [facility];  Service: Endoscopy;  Laterality: N/A;  . ESOPHAGOGASTRODUODENOSCOPY  07/28/2011   Procedure: ESOPHAGOGASTRODUODENOSCOPY (EGD);  Surgeon: Willis Modena, MD;  Location: [facility];  Service: Endoscopy;  Laterality: N/A;  . ESOPHAGOGASTRODUODENOSCOPY (EGD) WITH PROPOFOL N/A 06/26/2014   Procedure: ESOPHAGOGASTRODUODENOSCOPY (EGD) WITH PROPOFOL;  Surgeon: Wandalee Ferdinand, MD;  Location: [facility];  Service: Endoscopy;  Laterality: N/A;  . FINGER MASS EXCISION Left    index finger  . FOREIGN BODY REMOVAL N/A 06/26/2014   Procedure: FOREIGN BODY REMOVAL;  Surgeon: Wandalee Ferdinand, MD;  Location: [facility];  Service: Endoscopy;  Laterality: N/A;  . MASS EXCISION Right 04/16/2013   Procedure: RIGHT INDEX EXCISION MASS;  Surgeon: Tami Ribas, MD;  Location: [facility];  Service: Orthopedics;  Laterality: Right;  . TUBAL LIGATION      OB History    Gravida  3  Para  3   Term  3   Preterm      AB      Living  3     SAB      IAB      Ectopic      Multiple  0   Live Births  3            Home Medications    Prior to Admission medications   Medication Sig Start Date End Date Taking? Authorizing Provider  amoxicillin (AMOXIL) 875 MG tablet Take 1 tablet (875 mg total) by mouth 2 (two) times  daily for 7 days. 07/28/20 08/04/20 Yes Journii Nierman C, PA-C  predniSONE (DELTASONE) 20 MG tablet Take 2 tablets (40 mg total) by mouth daily with breakfast for 5 days. 07/28/20 08/02/20 Yes Tashae Inda C, PA-C  NIFEdipine (ADALAT CC) 60 MG 24 hr tablet Take 1 tablet (60 mg total) by mouth daily. 09/23/19   Adam Phenix, MD    Family History Family History  Problem Relation Age of Onset  . Anesthesia problems Mother        post-op nausea  . Colon cancer Father     Social History Social History   Tobacco Use  . Smoking status: Never Smoker  . Smokeless tobacco: Never Used  Vaping Use  . Vaping Use: Never used  Substance Use Topics  . Alcohol use: No  . Drug use: No     Allergies   Doxycycline, Codeine, Sucralfate, Augmentin [amoxicillin-pot clavulanate], and Red dye   Review of Systems Review of Systems  Constitutional: Negative for activity change, appetite change, chills, fatigue and fever.  HENT: Positive for congestion, rhinorrhea, sinus pressure and sore throat. Negative for ear pain and trouble swallowing.   Eyes: Negative for discharge and redness.  Respiratory: Positive for cough. Negative for chest tightness and shortness of breath.   Cardiovascular: Negative for chest pain.  Gastrointestinal: Negative for abdominal pain, diarrhea, nausea and vomiting.  Musculoskeletal: Negative for myalgias.  Skin: Negative for rash.  Neurological: Negative for dizziness, light-headedness and headaches.     Physical Exam Triage Vital Signs ED Triage Vitals  Enc Vitals Group     BP 07/28/20 1326 121/80     Pulse Rate 07/28/20 1326 74     Resp 07/28/20 1326 18     Temp 07/28/20 1326 97.8 F (36.6 C)     Temp src --      SpO2 07/28/20 1326 96 %     Weight --      Height --      Head Circumference --      Peak Flow --      Pain Score 07/28/20 1327 5     Pain Loc --      Pain Edu? --      Excl. in GC? --    No data found.  Updated Vital Signs BP 121/80 (BP  Location: Left Arm)   Pulse 74   Temp 97.8 F (36.6 C)   Resp 18   SpO2 96%   Visual Acuity Right Eye Distance:   Left Eye Distance:   Bilateral Distance:    Right Eye Near:   Left Eye Near:    Bilateral Near:     Physical Exam Vitals and nursing note reviewed.  Constitutional:      Appearance: She is well-developed.     Comments: No acute distress  HENT:     Head: Normocephalic and atraumatic.  Ears:     Comments: Bilateral effusions, slightly opaque    Nose: Nose normal.     Mouth/Throat:     Comments: Oral mucosa pink and moist, no tonsillar enlargement or exudate. Posterior pharynx patent and nonerythematous, no uvula deviation or swelling. Normal phonation. Eyes:     Conjunctiva/sclera: Conjunctivae normal.  Cardiovascular:     Rate and Rhythm: Normal rate.  Pulmonary:     Effort: Pulmonary effort is normal. No respiratory distress.     Comments: Breathing comfortably at rest, CTABL, no wheezing, rales or other adventitious sounds auscultated Abdominal:     General: There is no distension.  Musculoskeletal:        General: Normal range of motion.     Cervical back: Neck supple.  Skin:    General: Skin is warm and dry.  Neurological:     Mental Status: She is alert and oriented to person, place, and time.      UC Treatments / Results  Labs (all labs ordered are listed, but only abnormal results are displayed) Labs Reviewed - No data to display  EKG   Radiology No results found.  Procedures Procedures (including critical care time)  Medications Ordered in UC Medications - No data to display  Initial Impression / Assessment and Plan / UC Course  I have reviewed the triage vital signs and the nursing notes.  Pertinent labs & imaging results that were available during my care of the patient were reviewed by me and considered in my medical decision making (see chart for details).     Treating for sinusitis given length of symptoms, cannot  tolerate Augmentin and doxycycline, does tolerate plain amoxicillin, will place on amoxicillin 875 x 1 week, prednisone course x5 days, declines history of diabetes.  Continue OTC meds for further relief of cough and congestion as well.  Rest and fluids.  Discussed strict return precautions. Patient verbalized understanding and is agreeable with plan.  Final Clinical Impressions(s) / UC Diagnoses   Final diagnoses:  Acute sinusitis with symptoms > 10 days     Discharge Instructions     Begin amoxicillin twice daily for 1 week Prednisone 40 mg daily for 5 days Continue over-the-counter medicine for cough and congestion Rest and fluids Follow-up if not proving or worsening    ED Prescriptions    Medication Sig Dispense Auth. Provider   predniSONE (DELTASONE) 20 MG tablet Take 2 tablets (40 mg total) by mouth daily with breakfast for 5 days. 10 tablet Ules Marsala C, PA-C   amoxicillin (AMOXIL) 875 MG tablet Take 1 tablet (875 mg total) by mouth 2 (two) times daily for 7 days. 14 tablet Sevin Farone, [place] C, PA-C     PDMP not reviewed this encounter.   Alleah Dearman, [place] C, PA-C 07/28/20 1409

## 2020-07-28 NOTE — Discharge Instructions (Signed)
Begin amoxicillin twice daily for 1 week Prednisone 40 mg daily for 5 days Continue over-the-counter medicine for cough and congestion Rest and fluids Follow-up if not proving or worsening

## 2020-08-11 ENCOUNTER — Ambulatory Visit (INDEPENDENT_AMBULATORY_CARE_PROVIDER_SITE_OTHER): Admitting: Women's Health

## 2020-08-11 ENCOUNTER — Other Ambulatory Visit (HOSPITAL_COMMUNITY)
Admission: RE | Admit: 2020-08-11 | Discharge: 2020-08-11 | Disposition: A | Discharge status: Home / Self Care | Source: Ambulatory Visit | Attending: Obstetrics & Gynecology | Admitting: Obstetrics & Gynecology

## 2020-08-11 ENCOUNTER — Other Ambulatory Visit: Payer: Self-pay

## 2020-08-11 ENCOUNTER — Encounter: Payer: Self-pay | Admitting: Women's Health

## 2020-08-11 VITALS — BP 136/85 | HR 78 | Ht 61.0 in | Wt 141.4 lb

## 2020-08-11 DIAGNOSIS — N920 Excessive and frequent menstruation with regular cycle: Secondary | ICD-10-CM | POA: Insufficient documentation

## 2020-08-11 DIAGNOSIS — Z1151 Encounter for screening for human papillomavirus (HPV): Secondary | ICD-10-CM | POA: Insufficient documentation

## 2020-08-11 DIAGNOSIS — Z01419 Encounter for gynecological examination (general) (routine) without abnormal findings: Secondary | ICD-10-CM

## 2020-08-11 DIAGNOSIS — Z113 Encounter for screening for infections with a predominantly sexual mode of transmission: Secondary | ICD-10-CM | POA: Diagnosis not present

## 2020-08-11 MED ORDER — LO LOESTRIN FE 1 MG-10 MCG / 10 MCG PO TABS: 1.0000 | ORAL_TABLET | Freq: Every day | ORAL | 3 refills | Status: DC

## 2020-08-11 NOTE — Progress Notes (Signed)
WELL-WOMAN EXAMINATION Patient name: [name] MRN [id number]  Date of birth: 1980-04-11 Chief Complaint:   Gynecologic Exam  History of Present Illness:   [name] is a 40 y.o. G70P3003 Caucasian female being seen today for a routine well-woman exam.  Current complaints: heavy longer periods since bilateral salpingectomy in July. Now last about 7d, wears super tampon and pad and soils clothes at times, some cramps, some small clots. Bad nausea. Denies abnormal discharge, itching/odor/irritation.  Bruises easily.   Depression screen [facility] 2/9 08/11/2020 07/09/2019 03/19/2019 06/01/2018 07/04/2015  Decreased Interest 0 0 0 0 0  Down, Depressed, Hopeless 0 0 0 0 0  PHQ - 2 Score 0 0 0 0 0  Altered sleeping 0 0 0 0 -  Tired, decreased energy 0 0 0 0 -  Change in appetite 0 0 0 0 -  Feeling bad or failure about yourself  0 0 0 0 -  Trouble concentrating 0 0 0 0 -  Moving slowly or fidgety/restless 0 0 0 0 -  Suicidal thoughts 0 0 0 0 -  PHQ-9 Score 0 0 0 0 -  Difficult doing work/chores - Not difficult at all - - -     PCP: [facility]      Patient's last menstrual period was 08/05/2020 (exact date). The current method of family planning is salpingectomy.  Last pap 08/06/19. Results were: NILM w/ HRHPV negative. H/O abnormal pap: yes 06/01/18, LSIL w/ -HRHPV- wants to repeat today Last mammogram: 12/27/19. Results were: normal. Family h/o breast cancer: no Last colonoscopy: 2018. Results were: few polyps, was told to repeat in 64yrs Family h/o colorectal cancer: yes dad dx in 39s, PGF in 65s Review of Systems:   Pertinent items are noted in HPI Denies any headaches, blurred vision, fatigue, shortness of breath, chest pain, abdominal pain, abnormal vaginal discharge/itching/odor/irritation, problems with periods, bowel movements, urination, or intercourse unless otherwise stated above. Pertinent History Reviewed:  Reviewed past medical,surgical, social and family history.   Reviewed problem list, medications and allergies. Physical Assessment:   Vitals:   08/11/20 1613  BP: 136/85  Pulse: 78  Weight: 141 lb 6.4 oz (64.1 kg)  Height: 5\' 1"  (1.549 m)  Body mass index is 26.72 kg/m.        Physical Examination:   General appearance - well appearing, and in no distress  Mental status - alert, oriented to person, place, and time  Psych:  She has a normal mood and affect  Skin - warm and dry, normal color, no suspicious lesions noted  Chest - effort normal, all lung fields clear to auscultation bilaterally  Heart - normal rate and regular rhythm  Neck:  midline trachea, no thyromegaly or nodules  Breasts - breasts appear normal, no suspicious masses, no skin or nipple changes or  axillary nodes  Abdomen - soft, nontender, nondistended, no masses or organomegaly  Pelvic - VULVA: normal appearing vulva with no masses, tenderness or lesions  VAGINA: normal appearing vagina with normal color and discharge, no lesions  CERVIX: normal appearing cervix without discharge or lesions, no CMT  Thin prep pap is done w/ HR HPV cotesting  UTERUS: uterus is felt to be normal size, shape, consistency and nontender   ADNEXA: No adnexal masses or tenderness noted.  Extremities:  No swelling or varicosities noted  Chaperone:    No results found for this or any previous visit (from the past 24 hour(s)).  Assessment & Plan:  1)  Well-Woman Exam  2) Heavier prolonged periods since salpingectomy> will check STD/vaginitis from pap, rx'd LoLoestrin, f/u  3) H/O abnormal pap> repeat today  Labs/procedures today: pap  Mammogram: in Oct, or sooner if problems Colonoscopy: per GI, or sooner if problems  No orders of the defined types were placed in this encounter.   Meds:  Meds ordered this encounter  Medications  . LO LOESTRIN FE 1 MG-10 MCG / 10 MCG tablet    Sig: Take 1 tablet by mouth daily.    Dispense:  90 tablet    Refill:  3    For co-pay  card, pt to text "Lo Loestrin Fe " to [phone]              Co-pay card must be run in second position  "other coverage code 3"  if denied d/t PA, step edit, or insurance denial    Order Specific Question:   Supervising Provider    Answer:   [name] [[id number]]    Follow-up: Return in about 3 months (around 11/11/2020) for med f/u, CNM, in person.  Cheral Marker CNM, [facility] 08/11/2020 4:58 PM

## 2020-08-13 ENCOUNTER — Other Ambulatory Visit: Payer: Self-pay | Admitting: Women's Health

## 2020-08-13 LAB — CYTOLOGY - PAP
Chlamydia: NEGATIVE
Comment: NEGATIVE
Comment: NEGATIVE
Comment: NORMAL
Diagnosis: NEGATIVE
High risk HPV: NEGATIVE
Neisseria Gonorrhea: NEGATIVE

## 2020-08-13 MED ORDER — FLUCONAZOLE 150 MG PO TABS: 150.0000 mg | ORAL_TABLET | Freq: Once | ORAL | 0 refills | Status: AC

## 2020-08-14 ENCOUNTER — Other Ambulatory Visit: Payer: Self-pay | Admitting: Women's Health

## 2020-08-14 MED ORDER — NORETHIN ACE-ETH ESTRAD-FE 1-20 MG-MCG(24) PO TABS: 1.0000 | ORAL_TABLET | Freq: Every day | ORAL | 3 refills | Status: DC

## 2020-09-11 ENCOUNTER — Other Ambulatory Visit: Payer: Self-pay | Admitting: Women's Health

## 2020-09-11 MED ORDER — ONDANSETRON 4 MG PO TBDP: 4.0000 mg | ORAL_TABLET | Freq: Three times a day (TID) | ORAL | 0 refills | Status: DC | PRN

## 2020-10-27 ENCOUNTER — Ambulatory Visit (INDEPENDENT_AMBULATORY_CARE_PROVIDER_SITE_OTHER): Admitting: Obstetrics & Gynecology

## 2020-10-27 ENCOUNTER — Other Ambulatory Visit: Payer: Self-pay

## 2020-10-27 ENCOUNTER — Encounter: Payer: Self-pay | Admitting: Obstetrics & Gynecology

## 2020-10-27 VITALS — BP 136/84 | HR 70 | Ht 61.0 in | Wt 146.0 lb

## 2020-10-27 DIAGNOSIS — N946 Dysmenorrhea, unspecified: Secondary | ICD-10-CM | POA: Diagnosis not present

## 2020-10-27 DIAGNOSIS — N938 Other specified abnormal uterine and vaginal bleeding: Secondary | ICD-10-CM

## 2020-10-27 MED ORDER — MEGESTROL ACETATE 40 MG PO TABS: ORAL_TABLET | ORAL | 3 refills | Status: DC

## 2020-10-27 NOTE — Progress Notes (Signed)
Chief Complaint  Patient presents with   Follow-up    Having prolonged periods, birth control didn't help. Wants to discuss other options.       40 y.o. [id number] Patient's last menstrual period was 10/22/2020. The current method of family planning is tubal ligation.  Outpatient Encounter Medications as of 10/27/2020  Medication Sig   megestrol (MEGACE) 40 MG tablet 3 tablets a day for 5 days, 2 tablets a day for 5 days then 1 tablet daily   Norethindrone Acetate-Ethinyl Estrad-FE (LOESTRIN 24 FE) 1-20 MG-MCG(24) tablet Take 1 tablet by mouth daily. (Patient not taking: Reported on 10/27/2020)   ondansetron (ZOFRAN ODT) 4 MG disintegrating tablet Take 1 tablet (4 mg total) by mouth every 8 (eight) hours as needed for nausea or vomiting. (Patient not taking: Reported on 10/27/2020)   No facility-administered encounter medications on file as of 10/27/2020.    Subjective Pt with menorrhagia dysmenorrhea Begun on Lo Lo estrin OCP which has made her bleeding worse, dys synchronous Pain linearly related as well Past Medical History:  Diagnosis Date   Asthma    prn inhaler   Finger mass, right 03/2013   index finger   GERD (gastroesophageal reflux disease)    H/O hiatal hernia    History of esophageal stricture    multiple esophageal dilations - states x 12   Migraine    PONV (postoperative nausea and vomiting)    nausea only    Past Surgical History:  Procedure Laterality Date   BALLOON DILATION  06/16/2011   Procedure: BALLOON DILATION;  Surgeon: Willis Modena, MD;  Location: [facility];  Service: Endoscopy;  Laterality: N/A;   BALLOON DILATION  07/07/2011   Procedure: BALLOON DILATION;  Surgeon: Willis Modena, MD;  Location: [facility];  Service: Endoscopy;  Laterality: N/A;   BALLOON DILATION  07/28/2011   Procedure: BALLOON DILATION;  Surgeon: Willis Modena, MD;  Location: [facility];  Service: Endoscopy;  Laterality: N/A;   CESAREAN SECTION  1999; 02/07/2003    CESAREAN SECTION WITH BILATERAL TUBAL LIGATION N/A 09/21/2019   Procedure: CESAREAN SECTION WITH BILATERAL TUBAL LIGATION;  Surgeon: Warden Fillers, MD;  Location: [facility];  Service: Obstetrics;  Laterality: N/A;   ESOPHAGOGASTRODUODENOSCOPY  07/07/2011   ESOPHAGOGASTRODUODENOSCOPY  06/16/2011   Procedure: ESOPHAGOGASTRODUODENOSCOPY (EGD);  Surgeon: Willis Modena, MD;  Location: [facility];  Service: Endoscopy;  Laterality: N/A;   ESOPHAGOGASTRODUODENOSCOPY  07/28/2011   Procedure: ESOPHAGOGASTRODUODENOSCOPY (EGD);  Surgeon: Willis Modena, MD;  Location: [facility];  Service: Endoscopy;  Laterality: N/A;   ESOPHAGOGASTRODUODENOSCOPY (EGD) WITH PROPOFOL N/A 06/26/2014   Procedure: ESOPHAGOGASTRODUODENOSCOPY (EGD) WITH PROPOFOL;  Surgeon: Wandalee Ferdinand, MD;  Location: [facility];  Service: Endoscopy;  Laterality: N/A;   FINGER MASS EXCISION Left    index finger   FOREIGN BODY REMOVAL N/A 06/26/2014   Procedure: FOREIGN BODY REMOVAL;  Surgeon: Wandalee Ferdinand, MD;  Location: [facility];  Service: Endoscopy;  Laterality: N/A;   MASS EXCISION Right 04/16/2013   Procedure: RIGHT INDEX EXCISION MASS;  Surgeon: Tami Ribas, MD;  Location: [facility];  Service: Orthopedics;  Laterality: Right;   TUBAL LIGATION      OB History     Gravida  3   Para  3   Term  3   Preterm      AB      Living  3      SAB      IAB      Ectopic  Multiple  0   Live Births  3           Allergies  Allergen Reactions   Doxycycline Rash   Codeine Nausea And Vomiting   Sucralfate Nausea And Vomiting   Augmentin [Amoxicillin-Pot Clavulanate]     Pt states she recently took amoxicillin without incident, but cannot take the combination in Augmentin.   Has patient had a PCN reaction causing immediate rash, facial/tongue/throat swelling, SOB or lightheadedness with hypotension: No Has patient had a PCN reaction causing severe rash involving mucus membranes or skin necrosis: No Has  patient had a PCN reaction that required hospitalization No Has patient had a PCN reaction occurring within the last 10 years: No If all of the above answers are "NO   Red Dye Other (See Comments)    dizziness    Social History   Socioeconomic History   Marital status: Married    Spouse name: Not on file   Number of children: 2   Years of education: Not on file   Highest education level: Not on file  Occupational History   Not on file  Tobacco Use   Smoking status: Never   Smokeless tobacco: Never  Vaping Use   Vaping Use: Never used  Substance and Sexual Activity   Alcohol use: Yes    Comment: occ   Drug use: No   Sexual activity: Yes    Birth control/protection: Surgical    Comment: tubal  Other Topics Concern   Not on file  Social History Narrative   Not on file   Social Determinants of Health   Financial Resource Strain: Low Risk    Difficulty of Paying Living Expenses: Not hard at all  Food Insecurity: No Food Insecurity   Worried About Programme researcher, broadcasting/film/video in the Last Year: Never true   Ran Out of Food in the Last Year: Never true  Transportation Needs: No Transportation Needs   Lack of Transportation (Medical): No   Lack of Transportation (Non-Medical): No  Physical Activity: Sufficiently Active   Days of Exercise per Week: 5 days   Minutes of Exercise per Session: 100 min  Stress: No Stress Concern Present   Feeling of Stress : Not at all  Social Connections: Moderately Integrated   Frequency of Communication with Friends and Family: More than three times a week   Frequency of Social Gatherings with Friends and Family: More than three times a week   Attends Religious Services: Never   Database administrator or Organizations: Yes   Attends Engineer, structural: More than 4 times per year   Marital Status: Married    Family History  Problem Relation Age of Onset   Anesthesia problems Mother        post-op nausea   Colon cancer Father      Medications:       Current Outpatient Medications:    megestrol (MEGACE) 40 MG tablet, 3 tablets a day for 5 days, 2 tablets a day for 5 days then 1 tablet daily, Disp: 45 tablet, Rfl: 3   Norethindrone Acetate-Ethinyl Estrad-FE (LOESTRIN 24 FE) 1-20 MG-MCG(24) tablet, Take 1 tablet by mouth daily. (Patient not taking: Reported on 10/27/2020), Disp: 90 tablet, Rfl: 3   ondansetron (ZOFRAN ODT) 4 MG disintegrating tablet, Take 1 tablet (4 mg total) by mouth every 8 (eight) hours as needed for nausea or vomiting. (Patient not taking: Reported on 10/27/2020), Disp: 20 tablet, Rfl: 0  Objective Blood pressure 136/84,  pulse 70, height 5\' 1"  (1.549 m), weight 146 lb (66.2 kg), last menstrual period 10/22/2020, not currently breastfeeding.  Gen WDWN NAD  Pertinent ROS No burning with urination, frequency or urgency No nausea, vomiting or diarrhea Nor fever chills or other constitutional symptoms   Labs or studies reviewed    Impression Diagnoses this Encounter::   ICD-10-CM   1. DUB (dysfunctional uterine bleeding)  N93.8 [id number] PELVIS (TRANSABDOMINAL ONLY)    US Transvaginal Non-OB    2. Dysmenorrhea  N94.6 US PELVIS (TRANSABDOMINAL ONLY)    US Transvaginal Non-OB      Established relevant diagnosis(es):   Plan/Recommendations: Meds ordered this encounter  Medications   megestrol (MEGACE) 40 MG tablet    Sig: 3 tablets a day for 5 days, 2 tablets a day for 5 days then 1 tablet daily    Dispense:  45 tablet    Refill:  3    Labs or Scans Ordered: Orders Placed This Encounter  Procedures   US PELVIS (TRANSABDOMINAL ONLY)   US Transvaginal Non-OB    Management:: Trial of megestrol  sonogram to evaluate endometrium Consider ablation as next management option  Follow up Return in about 2 weeks (around 11/10/2020) for GYN sono, Follow up, with Dr 11/12/2020.      All questions were answered.

## 2020-11-11 ENCOUNTER — Ambulatory Visit: Admitting: Women's Health

## 2020-11-17 ENCOUNTER — Other Ambulatory Visit

## 2020-11-18 ENCOUNTER — Ambulatory Visit (INDEPENDENT_AMBULATORY_CARE_PROVIDER_SITE_OTHER): Admitting: Obstetrics & Gynecology

## 2020-11-18 ENCOUNTER — Other Ambulatory Visit: Payer: Self-pay

## 2020-11-18 ENCOUNTER — Ambulatory Visit (INDEPENDENT_AMBULATORY_CARE_PROVIDER_SITE_OTHER)

## 2020-11-18 ENCOUNTER — Encounter: Payer: Self-pay | Admitting: Obstetrics & Gynecology

## 2020-11-18 VITALS — BP 132/87 | HR 86 | Wt 146.0 lb

## 2020-11-18 DIAGNOSIS — N946 Dysmenorrhea, unspecified: Secondary | ICD-10-CM | POA: Diagnosis not present

## 2020-11-18 DIAGNOSIS — N938 Other specified abnormal uterine and vaginal bleeding: Secondary | ICD-10-CM

## 2020-11-18 NOTE — Progress Notes (Signed)
Follow up appointment for results  Chief Complaint  Patient presents with   Follow-up    Ultrasound today    Blood pressure 132/87, pulse 86, weight 146 lb (66.2 kg), last menstrual period 10/22/2020, not currently breastfeeding.  US Transvaginal Non-OB  Result Date: 11/18/2020 Images from the original result were not included.  Center for [facility] @ [facility] [address] [place] [address]                                                                                                                                   GYNECOLOGIC SONOGRAM [name] is a 40 y.o. [id number] Patient's last menstrual period was 10/22/2020. She is here for a pelvic sonogram for dysfunctional bleeding,dysmenorrhea. Uterus                      7.2 x 3.4 x 4.4 cm, Total uterine volume 55.3 cc,heterogeneous anteverted uterus,wnl Endometrium          5.3 mm, symmetrical, wnl Right ovary             2.1 x 1.9 x 1 cm, wnl Left ovary                2.2 x 1.7 x 1.8 cm, wnl No free fluid Technician Comments: PELVIC US TA/TV: heterogeneous anteverted uterus,wnl,EEC 5.3 mm,normal ovaries,ovaries appear mobile,no free fluid ,no pain during ultrasound Chaperone [address] Flora Lipps 11/18/2020 4:24 PM Clinical Impression and recommendations: I have reviewed the sonogram results above, combined with the patient's current clinical course, below are my impressions and any appropriate recommendations for management based on the sonographic findings. Uterus is normal size shape and contour Endometrium is normal Both ovaries are normal size shape and morphology [name] 11/18/2020 4:28 PM   US PELVIS (TRANSABDOMINAL ONLY)  Result Date: 11/18/2020 Images from the original result were not included.  Center for [facility] Healthcare @ [facility] [address] [place] [address]                                                                                                                                    GYNECOLOGIC SONOGRAM [name] is a 40 y.o. [id number] Patient's last menstrual period was 10/22/2020. She is here for a pelvic sonogram for dysfunctional bleeding,dysmenorrhea. Uterus  7.2 x 3.4 x 4.4 cm, Total uterine volume 55.3 cc,heterogeneous anteverted uterus,wnl Endometrium          5.3 mm, symmetrical, wnl Right ovary             2.1 x 1.9 x 1 cm, wnl Left ovary                2.2 x 1.7 x 1.8 cm, wnl No free fluid Technician Comments: PELVIC US TA/TV: heterogeneous anteverted uterus,wnl,EEC 5.3 mm,normal ovaries,ovaries appear mobile,no free fluid ,no pain during ultrasound Chaperone [address] Flora Lipps 11/18/2020 4:24 PM Clinical Impression and recommendations: I have reviewed the sonogram results above, combined with the patient's current clinical course, below are my impressions and any appropriate recommendations for management based on the sonographic findings. Uterus is normal size shape and contour Endometrium is normal Both ovaries are normal size shape and morphology [name] 11/18/2020 4:28 PM      MEDS ordered this encounter: No orders of the defined types were placed in this encounter.   Orders for this encounter: No orders of the defined types were placed in this encounter.   Impression:   ICD-10-CM   1. DUB (dysfunctional uterine bleeding)  N93.8     2. Dysmenorrhea  N94.6        Plan: Sonogram is normal Has responded well to the megestrol   Options discussed, will proceed with ablation 12/31/20 Stay on megestrol  Follow Up: Return in about 7 weeks (around 01/09/2021) for [employer] visit, Post Op, with Dr Despina Hidden.    All questions were answered.  Past Medical History:  Diagnosis Date   Asthma    prn inhaler   Finger mass, right 03/2013   index finger   GERD (gastroesophageal reflux disease)    H/O hiatal hernia    History of esophageal stricture    multiple esophageal dilations - states x 12   Migraine    PONV  (postoperative nausea and vomiting)    nausea only    Past Surgical History:  Procedure Laterality Date   BALLOON DILATION  06/16/2011   Procedure: BALLOON DILATION;  Surgeon: Willis Modena, MD;  Location: [facility];  Service: Endoscopy;  Laterality: N/A;   BALLOON DILATION  07/07/2011   Procedure: BALLOON DILATION;  Surgeon: Willis Modena, MD;  Location: [facility];  Service: Endoscopy;  Laterality: N/A;   BALLOON DILATION  07/28/2011   Procedure: BALLOON DILATION;  Surgeon: Willis Modena, MD;  Location: [facility];  Service: Endoscopy;  Laterality: N/A;   CESAREAN SECTION  1999; 02/07/2003   CESAREAN SECTION WITH BILATERAL TUBAL LIGATION N/A 09/21/2019   Procedure: CESAREAN SECTION WITH BILATERAL TUBAL LIGATION;  Surgeon: Warden Fillers, MD;  Location: [facility];  Service: Obstetrics;  Laterality: N/A;   ESOPHAGOGASTRODUODENOSCOPY  07/07/2011   ESOPHAGOGASTRODUODENOSCOPY  06/16/2011   Procedure: ESOPHAGOGASTRODUODENOSCOPY (EGD);  Surgeon: Willis Modena, MD;  Location: [facility];  Service: Endoscopy;  Laterality: N/A;   ESOPHAGOGASTRODUODENOSCOPY  07/28/2011   Procedure: ESOPHAGOGASTRODUODENOSCOPY (EGD);  Surgeon: Willis Modena, MD;  Location: [facility];  Service: Endoscopy;  Laterality: N/A;   ESOPHAGOGASTRODUODENOSCOPY (EGD) WITH PROPOFOL N/A 06/26/2014   Procedure: ESOPHAGOGASTRODUODENOSCOPY (EGD) WITH PROPOFOL;  Surgeon: Wandalee Ferdinand, MD;  Location: [facility] END[facility]COPY;  Service: Endoscopy;  Laterality: N/A;   FINGER MASS EXCISION Left    index finger   FOREIGN BODY REMOVAL N/A 06/26/2014   Procedure: FOREIGN BODY REMOVAL;  Surgeon: Wandalee Ferdinand, MD;  Location: [facility] ENDOSCOPY;  Service: Endoscopy;  Laterality: N/A;  MASS EXCISION Right 04/16/2013   Procedure: RIGHT INDEX EXCISION MASS;  Surgeon: Tami Ribas, MD;  Location: [facility];  Service: Orthopedics;  Laterality: Right;   TUBAL LIGATION      OB History     Gravida  3   Para  3   Term  3   Preterm      AB       Living  3      SAB      IAB      Ectopic      Multiple  0   Live Births  3           Allergies  Allergen Reactions   Doxycycline Rash   Codeine Nausea And Vomiting   Sucralfate Nausea And Vomiting   Augmentin [Amoxicillin-Pot Clavulanate]     Pt states she recently took amoxicillin without incident, but cannot take the combination in Augmentin.   Has patient had a PCN reaction causing immediate rash, facial/tongue/throat swelling, SOB or lightheadedness with hypotension: No Has patient had a PCN reaction causing severe rash involving mucus membranes or skin necrosis: No Has patient had a PCN reaction that required hospitalization No Has patient had a PCN reaction occurring within the last 10 years: No If all of the above answers are "NO   [place]ye Other (See Comments)    dizziness    Social History   Socioeconomic History   Marital status: Married    Spouse name: Not on file   Number of children: 2   Years of education: Not on file   Highest education level: Not on file  Occupational History   Not on file  Tobacco Use   Smoking status: Never   Smokeless tobacco: Never  Vaping Use   Vaping Use: Never used  Substance and Sexual Activity   Alcohol use: Yes    Comment: occ   Drug use: No   Sexual activity: Yes    Birth control/protection: Surgical    Comment: tubal  Other Topics Concern   Not on file  Social History Narrative   Not on file   Social Determinants of Health   Financial Resource Strain: Low Risk    Difficulty of Paying Living Expenses: Not hard at all  Food Insecurity: No Food Insecurity   Worried About Programme researcher, broadcasting/film/video in the Last Year: Never true   Ran Out of Food in the Last Year: Never true  Transportation Needs: No Transportation Needs   Lack of Transportation (Medical): No   Lack of Transportation (Non-Medical): No  Physical Activity: Sufficiently Active   Days of Exercise per Week: 5 days   Minutes of Exercise per  Session: 100 min  Stress: No Stress Concern Present   Feeling of Stress : Not at all  Social Connections: Moderately Integrated   Frequency of Communication with Friends and Family: More than three times a week   Frequency of Social Gatherings with Friends and Family: More than three times a week   Attends Religious Services: Never   Database administrator or Organizations: Yes   Attends Engineer, structural: More than 4 times per year   Marital Status: Married    Family History  Problem Relation Age of Onset   Anesthesia problems Mother        post-op nausea   Colon cancer Father

## 2020-11-18 NOTE — Progress Notes (Signed)
PELVIC US TA/TV: heterogeneous anteverted uterus,wnl,EEC 5.3 mm,normal ovaries,ovaries appear mobile,no free fluid ,no pain during ultrasound   Chaperone Peggy

## 2020-11-21 ENCOUNTER — Other Ambulatory Visit: Payer: Self-pay | Admitting: Family Medicine

## 2020-11-21 DIAGNOSIS — Z1231 Encounter for screening mammogram for malignant neoplasm of breast: Secondary | ICD-10-CM

## 2020-12-25 NOTE — Patient Instructions (Signed)
[name]  12/25/2020     @PREFPERIOPPHARMACY @   Your procedure is scheduled on 12/31/2020.   Report to [facility] at  0700  AM.   Call this number if you have problems the morning of surgery:  [phone]   Remember:  Do not eat  after midnight.   You may drink clear liquids until 0430.  Clear liquids allowed are:                    Water, Juice (non-citric and without pulp - diabetics please choose diet or no sugar options), Carbonated beverages - (diabetics please choose diet or no sugar options), Clear Tea, Black Coffee only (no creamer, milk or cream including half and half), Plain Jell-O only (diabetics please choose diet or no sugar options), Gatorade (diabetics please choose diet or no sugar options), and Plain Popsicles only    At 0430 am drink your carb drink. You can not have anything else to drink after this.    Take these medicines the morning of surgery with A SIP OF WATER                               None    Do not wear jewelry, make-up or nail polish.  Do not wear lotions, powders, or perfumes, or deodorant.  Do not shave 48 hours prior to surgery.  Men may shave face and neck.  Do not bring valuables to the hospital.  [facility] [place] is not responsible for any belongings or valuables.  Contacts, dentures or bridgework may not be worn into surgery.  Leave your suitcase in the car.  After surgery it may be brought to your room.  For patients admitted to the hospital, discharge time will be determined by your treatment team.  Patients discharged the day of surgery will not be allowed to drive home and must have someone with them for 24 hours.    Special instructions:  DO NOT smoke tobacco or vape for 24 hours before your procedure.  Please read over the following fact sheets that you were given. Coughing and Deep Breathing, Surgical Site Infection Prevention, Anesthesia Post-op Instructions, and Care and Recovery After Surgery      Dilation  and Curettage or Vacuum Curettage, Care After The following information offers guidance on how to care for yourself after your procedure. Your doctor may also give you more specific instructions. If you have problems or questions, contact your doctor. What can I expect after the procedure? After the procedure, it is common to have: Mild pain or cramps. Some bleeding or spotting from the vagina. These may last for up to 2 weeks. Follow these instructions at home: Medicines Take over-the-counter and prescription medicines only as told by your doctor. If told, take steps to prevent problems with pooping (constipation). You may need to: Drink enough fluid to keep your pee (urine) pale yellow. Take medicines. You will be told what medicines to take. Eat foods that are high in fiber. These include beans, whole grains, and fresh fruits and vegetables. Limit foods that are high in fat and sugar. These include fried or sweet foods. Ask your doctor if you should avoid driving or using machines while you are taking your medicine. Activity  If you were given a medicine to help you relax (sedative) during your procedure, it can affect you for many hours. Do not drive or  use machinery until your doctor says that it is safe. Rest as told by your doctor. Get up to take short walks every 1-2 hours. Ask for help if you feel weak or unsteady. Do not lift anything that is heavier than 10 lb (4.5 kg), or the limit that you are told. Return to your normal activities when your doctor says that it is safe. Lifestyle For at least 2 weeks, or as long as told by your doctor: Do not douche. Do not use tampons. Do not have sex. General instructions Do not take baths, swim, or use a hot tub. Ask your doctor if you may take showers. Do not smoke or use any products that contain nicotine or tobacco. These can delay healing. If you need help quitting, ask your doctor. Wear compression stockings as told by your  doctor. It is up to you to get the results of your procedure. Ask how to get your results when they are ready. Keep all follow-up visits. Contact a doctor if: You have very bad cramps that get worse or do not get better with medicine. You have very bad pain in your belly (abdomen). You cannot drink fluids without vomiting. You have pain in the area just above your thighs. You have fluid from your vagina that smells bad. You have a rash. Get help right away if: You are bleeding a lot from your vagina. This means soaking more than one sanitary pad in 1 hour, and this happens for 2 hours in a row. You have a fever that is above 100.57F (38C). Your belly feels very tender or hard. You have chest pain. You have trouble breathing. You feel dizzy or light-headed. You faint. You have pain in your neck or shoulder area. These symptoms may be an emergency. Get help right away. Call your local emergency services (911 in the [place] Do not wait to see if the symptoms will go away. Do not drive yourself to the hospital. Summary After your procedure, it is common to have pain or cramping. It is also common to have bleeding or spotting from your vagina. Rest as told. Get up to take short walks every 1-2 hours. Do not lift anything that is heavier than 10 lb (4.5 kg), or the limit that you are told. Get help right away if you have problems from the procedure. Ask your doctor what problems to watch for. This information is not intended to replace advice given to you by your health care provider. Make sure you discuss any questions you have with your health care provider. Document Revised: 02/27/2020 Document Reviewed: 02/27/2020 Elsevier Patient Education  2022 [employer] How to Use Chlorhexidine for Bathing Chlorhexidine gluconate (CHG) is a germ-killing (antiseptic) solution that is used to clean the skin. It can get rid of the bacteria that normally live on the skin and can keep them away for  about 24 hours. To clean your skin with CHG, you may be given: A CHG solution to use in the shower or as part of a sponge bath. A prepackaged cloth that contains CHG. Cleaning your skin with CHG may help lower the risk for infection: While you are staying in the intensive care unit of the hospital. If you have a vascular access, such as a central line, to provide short-term or long-term access to your veins. If you have a catheter to drain urine from your bladder. If you are on a ventilator. A ventilator is a machine that helps you breathe by moving  air in and out of your lungs. After surgery. What are the risks? Risks of using CHG include: A skin reaction. Hearing loss, if CHG gets in your ears and you have a perforated eardrum. Eye injury, if CHG gets in your eyes and is not rinsed out. The CHG product catching fire. Make sure that you avoid smoking and flames after applying CHG to your skin. Do not use CHG: If you have a chlorhexidine allergy or have previously reacted to chlorhexidine. On babies younger than 8 months of age. How to use CHG solution Use CHG only as told by your health care provider, and follow the instructions on the label. Use the full amount of CHG as directed. Usually, this is one bottle. During a shower Follow these steps when using CHG solution during a shower (unless your health care provider gives you different instructions): Start the shower. Use your normal soap and shampoo to wash your face and hair. Turn off the shower or move out of the shower stream. Pour the CHG onto a clean washcloth. Do not use any type of brush or rough-edged sponge. Starting at your neck, lather your body down to your toes. Make sure you follow these instructions: If you will be having surgery, pay special attention to the part of your body where you will be having surgery. Scrub this area for at least 1 minute. Do not use CHG on your head or face. If the solution gets into your  ears or eyes, rinse them well with water. Avoid your genital area. Avoid any areas of skin that have broken skin, cuts, or scrapes. Scrub your back and under your arms. Make sure to wash skin folds. Let the lather sit on your skin for 1-2 minutes or as long as told by your health care provider. Thoroughly rinse your entire body in the shower. Make sure that all body creases and crevices are rinsed well. Dry off with a clean towel. Do not put any substances on your body afterward--such as powder, lotion, or perfume--unless you are told to do so by your health care provider. Only use lotions that are recommended by the manufacturer. Put on clean clothes or pajamas. If it is the night before your surgery, sleep in clean sheets.  During a sponge bath Follow these steps when using CHG solution during a sponge bath (unless your health care provider gives you different instructions): Use your normal soap and shampoo to wash your face and hair. Pour the CHG onto a clean washcloth. Starting at your neck, lather your body down to your toes. Make sure you follow these instructions: If you will be having surgery, pay special attention to the part of your body where you will be having surgery. Scrub this area for at least 1 minute. Do not use CHG on your head or face. If the solution gets into your ears or eyes, rinse them well with water. Avoid your genital area. Avoid any areas of skin that have broken skin, cuts, or scrapes. Scrub your back and under your arms. Make sure to wash skin folds. Let the lather sit on your skin for 1-2 minutes or as long as told by your health care provider. Using a different clean, wet washcloth, thoroughly rinse your entire body. Make sure that all body creases and crevices are rinsed well. Dry off with a clean towel. Do not put any substances on your body afterward--such as powder, lotion, or perfume--unless you are told to do so by your health  care provider. Only use  lotions that are recommended by the manufacturer. Put on clean clothes or pajamas. If it is the night before your surgery, sleep in clean sheets. How to use CHG prepackaged cloths Only use CHG cloths as told by your health care provider, and follow the instructions on the label. Use the CHG cloth on clean, dry skin. Do not use the CHG cloth on your head or face unless your health care provider tells you to. When washing with the CHG cloth: Avoid your genital area. Avoid any areas of skin that have broken skin, cuts, or scrapes. Before surgery Follow these steps when using a CHG cloth to clean before surgery (unless your health care provider gives you different instructions): Using the CHG cloth, vigorously scrub the part of your body where you will be having surgery. Scrub using a back-and-forth motion for 3 minutes. The area on your body should be completely wet with CHG when you are done scrubbing. Do not rinse. Discard the cloth and let the area air-dry. Do not put any substances on the area afterward, such as powder, lotion, or perfume. Put on clean clothes or pajamas. If it is the night before your surgery, sleep in clean sheets.  For general bathing Follow these steps when using CHG cloths for general bathing (unless your health care provider gives you different instructions). Use a separate CHG cloth for each area of your body. Make sure you wash between any folds of skin and between your fingers and toes. Wash your body in the following order, switching to a new cloth after each step: The front of your neck, shoulders, and chest. Both of your arms, under your arms, and your hands. Your stomach and groin area, avoiding the genitals. Your right leg and foot. Your left leg and foot. The back of your neck, your back, and your buttocks. Do not rinse. Discard the cloth and let the area air-dry. Do not put any substances on your body afterward--such as powder, lotion, or perfume--unless  you are told to do so by your health care provider. Only use lotions that are recommended by the manufacturer. Put on clean clothes or pajamas. Contact a health care provider if: Your skin gets irritated after scrubbing. You have questions about using your solution or cloth. You swallow any chlorhexidine. Call your local poison control center ([phone] in the [place] Get help right away if: Your eyes itch badly, or they become very red or swollen. Your skin itches badly and is red or swollen. Your hearing changes. You have trouble seeing. You have swelling or tingling in your mouth or throat. You have trouble breathing. These symptoms may represent a serious problem that is an emergency. Do not wait to see if the symptoms will go away. Get medical help right away. Call your local emergency services (911 in the [place] Do not drive yourself to the hospital. Summary Chlorhexidine gluconate (CHG) is a germ-killing (antiseptic) solution that is used to clean the skin. Cleaning your skin with CHG may help to lower your risk for infection. You may be given CHG to use for bathing. It may be in a bottle or in a prepackaged cloth to use on your skin. Carefully follow your health care provider's instructions and the instructions on the product label. Do not use CHG if you have a chlorhexidine allergy. Contact your health care provider if your skin gets irritated after scrubbing. This information is not intended to replace advice given to you by your  health care provider. Make sure you discuss any questions you have with your health care provider. Document Revised: 05/19/2020 Document Reviewed: 05/19/2020 Elsevier Patient Education  2022 [employer].

## 2020-12-28 ENCOUNTER — Other Ambulatory Visit: Payer: Self-pay | Admitting: Obstetrics & Gynecology

## 2020-12-28 DIAGNOSIS — Z01818 Encounter for other preprocedural examination: Secondary | ICD-10-CM

## 2020-12-29 ENCOUNTER — Other Ambulatory Visit: Payer: Self-pay

## 2020-12-29 ENCOUNTER — Encounter (HOSPITAL_COMMUNITY)
Admission: RE | Admit: 2020-12-29 | Discharge: 2020-12-29 | Disposition: A | Discharge status: Home / Self Care | Source: Ambulatory Visit | Attending: Obstetrics & Gynecology | Admitting: Obstetrics & Gynecology

## 2020-12-29 DIAGNOSIS — Z01812 Encounter for preprocedural laboratory examination: Secondary | ICD-10-CM | POA: Insufficient documentation

## 2020-12-29 DIAGNOSIS — Z01818 Encounter for other preprocedural examination: Secondary | ICD-10-CM

## 2020-12-29 LAB — COMPREHENSIVE METABOLIC PANEL
ALT: 10 U/L (ref 0–44)
AST: 12 U/L — ABNORMAL LOW (ref 15–41)
Albumin: 4.1 g/dL (ref 3.5–5.0)
Alkaline Phosphatase: 49 U/L (ref 38–126)
Anion gap: 6 (ref 5–15)
BUN: 12 mg/dL (ref 6–20)
CO2: 24 mmol/L (ref 22–32)
Calcium: 8.9 mg/dL (ref 8.9–10.3)
Chloride: 111 mmol/L (ref 98–111)
Creatinine, Ser: 0.71 mg/dL (ref 0.44–1.00)
GFR, Estimated: >60 mL/min (ref >60)
Glucose, Bld: 100 mg/dL — ABNORMAL HIGH (ref 70–99)
Potassium: 4 mmol/L (ref 3.5–5.1)
Sodium: 141 mmol/L (ref 135–145)
Total Bilirubin: 1 mg/dL (ref 0.3–1.2)
Total Protein: 6.9 g/dL (ref 6.5–8.1)

## 2020-12-29 LAB — CBC
HCT: 40.5 % (ref 36.0–46.0)
Hemoglobin: 13.6 g/dL (ref 12.0–15.0)
MCH: 30.5 pg (ref 26.0–34.0)
MCHC: 33.6 g/dL (ref 30.0–36.0)
MCV: 90.8 fL (ref 80.0–100.0)
Platelets: 306 10*3/uL (ref 150–400)
RBC: 4.46 MIL/uL (ref 3.87–5.11)
RDW: 13.7 % (ref 11.5–15.5)
WBC: 4.9 10*3/uL (ref 4.0–10.5)
nRBC: 0 % (ref 0.0–0.2)

## 2020-12-29 LAB — URINALYSIS, ROUTINE W REFLEX MICROSCOPIC
Bilirubin Urine: NEGATIVE
Glucose, UA: NEGATIVE mg/dL
Hgb urine dipstick: NEGATIVE
Ketones, ur: NEGATIVE mg/dL
Leukocytes,Ua: NEGATIVE
Nitrite: NEGATIVE
Protein, ur: NEGATIVE mg/dL
Specific Gravity, Urine: 1.025 (ref 1.005–1.030)
pH: 5.5 (ref 5.0–8.0)

## 2020-12-29 LAB — HCG, QUANTITATIVE, PREGNANCY: hCG, Beta Chain, Quant, S: <1 m[IU]/mL (ref <5)

## 2020-12-29 LAB — RAPID HIV SCREEN (HIV 1/2 AB+AG)
HIV 1/2 Antibodies: NONREACTIVE
HIV-1 P24 Antigen - HIV24: NONREACTIVE

## 2020-12-29 MED ORDER — ONDANSETRON 4 MG PO TBDP: 4.0000 mg | ORAL_TABLET | Freq: Three times a day (TID) | ORAL | 0 refills | Status: DC | PRN

## 2020-12-31 ENCOUNTER — Ambulatory Visit (HOSPITAL_COMMUNITY): Admitting: Anesthesiology

## 2020-12-31 ENCOUNTER — Encounter (HOSPITAL_COMMUNITY)
Admission: RE | Disposition: A | Discharge status: Home / Self Care | Payer: Self-pay | Source: Home / Self Care | Attending: Obstetrics & Gynecology

## 2020-12-31 ENCOUNTER — Other Ambulatory Visit: Payer: Self-pay

## 2020-12-31 ENCOUNTER — Encounter (HOSPITAL_COMMUNITY): Payer: Self-pay | Admitting: Obstetrics & Gynecology

## 2020-12-31 ENCOUNTER — Ambulatory Visit (HOSPITAL_COMMUNITY)
Admission: RE | Admit: 2020-12-31 | Discharge: 2020-12-31 | Disposition: A | Discharge status: Home / Self Care | Attending: Obstetrics & Gynecology | Admitting: Obstetrics & Gynecology

## 2020-12-31 DIAGNOSIS — N920 Excessive and frequent menstruation with regular cycle: Secondary | ICD-10-CM | POA: Diagnosis not present

## 2020-12-31 DIAGNOSIS — N938 Other specified abnormal uterine and vaginal bleeding: Secondary | ICD-10-CM | POA: Diagnosis not present

## 2020-12-31 DIAGNOSIS — N946 Dysmenorrhea, unspecified: Secondary | ICD-10-CM | POA: Diagnosis not present

## 2020-12-31 HISTORY — PX: DILATATION AND CURETTAGE/HYSTEROSCOPY WITH MINERVA: SHX6851

## 2020-12-31 SURGERY — DILATATION AND CURETTAGE/HYSTEROSCOPY WITH MINERVA
Anesthesia: General | Site: Vagina

## 2020-12-31 MED ORDER — HYDROCODONE-ACETAMINOPHEN 5-325 MG PO TABS: 1.0000 | ORAL_TABLET | Freq: Four times a day (QID) | ORAL | 0 refills | Status: DC | PRN

## 2020-12-31 MED ORDER — FENTANYL CITRATE PF 50 MCG/ML IJ SOSY: 25.0000 ug | PREFILLED_SYRINGE | INTRAMUSCULAR | Status: DC | PRN

## 2020-12-31 MED ORDER — DEXAMETHASONE SODIUM PHOSPHATE 10 MG/ML IJ SOLN
INTRAMUSCULAR | Status: DC | PRN
  Administered 2020-12-31: 10 mg via INTRAVENOUS

## 2020-12-31 MED ORDER — MEPERIDINE HCL 50 MG/ML IJ SOLN: 6.2500 mg | INTRAMUSCULAR | Status: DC | PRN

## 2020-12-31 MED ORDER — FENTANYL CITRATE (PF) 100 MCG/2ML IJ SOLN
INTRAMUSCULAR | Status: AC
  Filled 2020-12-31: qty 2

## 2020-12-31 MED ORDER — ONDANSETRON HCL 4 MG/2ML IJ SOLN
INTRAMUSCULAR | Status: AC
  Filled 2020-12-31: qty 2

## 2020-12-31 MED ORDER — SCOPOLAMINE 1 MG/3DAYS TD PT72
MEDICATED_PATCH | TRANSDERMAL | Status: AC
  Administered 2020-12-31: 1.5 mg via TRANSDERMAL
  Filled 2020-12-31: qty 1

## 2020-12-31 MED ORDER — KETOROLAC TROMETHAMINE 30 MG/ML IJ SOLN
INTRAMUSCULAR | Status: AC
  Administered 2020-12-31: 30 mg via INTRAVENOUS
  Filled 2020-12-31: qty 1

## 2020-12-31 MED ORDER — 0.9 % SODIUM CHLORIDE (POUR BTL) OPTIME
TOPICAL | Status: DC | PRN
  Administered 2020-12-31: 1000 mL

## 2020-12-31 MED ORDER — CEFAZOLIN SODIUM-DEXTROSE 2-4 GM/100ML-% IV SOLN
INTRAVENOUS | Status: AC
  Filled 2020-12-31: qty 100

## 2020-12-31 MED ORDER — PROPOFOL 10 MG/ML IV BOLUS
INTRAVENOUS | Status: AC
  Filled 2020-12-31: qty 40

## 2020-12-31 MED ORDER — ONDANSETRON HCL 4 MG/2ML IJ SOLN
INTRAMUSCULAR | Status: DC | PRN
  Administered 2020-12-31: 4 mg via INTRAVENOUS

## 2020-12-31 MED ORDER — KETOROLAC TROMETHAMINE 30 MG/ML IJ SOLN: 30.0000 mg | Freq: Once | INTRAMUSCULAR | Status: AC

## 2020-12-31 MED ORDER — CEFAZOLIN SODIUM-DEXTROSE 2-4 GM/100ML-% IV SOLN
2.0000 g | INTRAVENOUS | Status: AC
  Administered 2020-12-31: 2 g via INTRAVENOUS

## 2020-12-31 MED ORDER — MIDAZOLAM HCL 2 MG/2ML IJ SOLN
INTRAMUSCULAR | Status: AC
  Filled 2020-12-31: qty 2

## 2020-12-31 MED ORDER — LACTATED RINGERS IV SOLN: INTRAVENOUS | Status: DC

## 2020-12-31 MED ORDER — SCOPOLAMINE 1 MG/3DAYS TD PT72: 1.0000 | MEDICATED_PATCH | Freq: Once | TRANSDERMAL | Status: DC

## 2020-12-31 MED ORDER — LIDOCAINE HCL (CARDIAC) PF 100 MG/5ML IV SOSY
PREFILLED_SYRINGE | INTRAVENOUS | Status: DC | PRN
  Administered 2020-12-31: 60 mg via INTRAVENOUS

## 2020-12-31 MED ORDER — DEXAMETHASONE SODIUM PHOSPHATE 10 MG/ML IJ SOLN
INTRAMUSCULAR | Status: AC
  Filled 2020-12-31: qty 1

## 2020-12-31 MED ORDER — SODIUM CHLORIDE 0.9 % IR SOLN
Status: DC | PRN
  Administered 2020-12-31: 1000 mL

## 2020-12-31 MED ORDER — MIDAZOLAM HCL 2 MG/2ML IJ SOLN
INTRAMUSCULAR | Status: DC | PRN
  Administered 2020-12-31: 2 mg via INTRAVENOUS

## 2020-12-31 MED ORDER — FENTANYL CITRATE (PF) 250 MCG/5ML IJ SOLN
INTRAMUSCULAR | Status: DC | PRN
  Administered 2020-12-31: 50 ug via INTRAVENOUS
  Administered 2020-12-31: 25 ug via INTRAVENOUS

## 2020-12-31 MED ORDER — POVIDONE-IODINE 10 % EX SWAB
2.0000 "application " | Freq: Once | CUTANEOUS | Status: AC
  Administered 2020-12-31: 2 via TOPICAL

## 2020-12-31 MED ORDER — PROPOFOL 10 MG/ML IV BOLUS
INTRAVENOUS | Status: DC | PRN
  Administered 2020-12-31: 200 mg via INTRAVENOUS

## 2020-12-31 MED ORDER — LIDOCAINE HCL (PF) 2 % IJ SOLN
INTRAMUSCULAR | Status: AC
  Filled 2020-12-31: qty 5

## 2020-12-31 MED ORDER — ONDANSETRON 8 MG PO TBDP: 8.0000 mg | ORAL_TABLET | Freq: Three times a day (TID) | ORAL | 0 refills | Status: DC | PRN

## 2020-12-31 MED ORDER — KETOROLAC TROMETHAMINE 10 MG PO TABS: 10.0000 mg | ORAL_TABLET | Freq: Three times a day (TID) | ORAL | 0 refills | Status: DC | PRN

## 2020-12-31 MED ORDER — ONDANSETRON HCL 4 MG/2ML IJ SOLN
4.0000 mg | Freq: Once | INTRAMUSCULAR | Status: AC | PRN
  Administered 2020-12-31: 4 mg via INTRAVENOUS
  Filled 2020-12-31: qty 2

## 2020-12-31 SURGICAL SUPPLY — 26 items
BAG HAMPER (MISCELLANEOUS) ×2 IMPLANT
CLOTH BEACON ORANGE TIMEOUT ST (SAFETY) ×2 IMPLANT
COVER LIGHT HANDLE STERIS (MISCELLANEOUS) ×3 IMPLANT
GAUZE 4X4 16PLY ~~LOC~~+RFID DBL (SPONGE) ×4 IMPLANT
GLOVE ECLIPSE 8.0 STRL XLNG CF (GLOVE) ×2 IMPLANT
GLOVE SRG 8 PF TXTR STRL LF DI (GLOVE) ×1 IMPLANT
GLOVE SURG UNDER POLY LF SZ7 (GLOVE) ×4 IMPLANT
GLOVE SURG UNDER POLY LF SZ8 (GLOVE) ×2
GOWN STRL REUS W/TWL LRG LVL3 (GOWN DISPOSABLE) ×2 IMPLANT
GOWN STRL REUS W/TWL XL LVL3 (GOWN DISPOSABLE) ×2 IMPLANT
HANDPIECE ABLA MINERVA ENDO (MISCELLANEOUS) ×2 IMPLANT
INST SET HYSTEROSCOPY (KITS) ×2 IMPLANT
IV NS 1000ML (IV SOLUTION) ×2
IV NS 1000ML BAXH (IV SOLUTION) ×1 IMPLANT
KIT TURNOVER CYSTO (KITS) ×2 IMPLANT
MANIFOLD NEPTUNE II (INSTRUMENTS) ×2 IMPLANT
MARKER SKIN DUAL TIP RULER LAB (MISCELLANEOUS) ×2 IMPLANT
NS IRRIG 1000ML POUR BTL (IV SOLUTION) ×2 IMPLANT
PACK BASIC III (CUSTOM PROCEDURE TRAY) ×2
PACK SRG BSC III STRL LF ECLPS (CUSTOM PROCEDURE TRAY) ×1 IMPLANT
PAD ARMBOARD 7.5X6 YLW CONV (MISCELLANEOUS) ×2 IMPLANT
PAD TELFA 3X4 1S STER (GAUZE/BANDAGES/DRESSINGS) ×2 IMPLANT
SET BASIN LINEN APH (SET/KITS/TRAYS/PACK) ×2 IMPLANT
SET CYSTO W/LG BORE CLAMP LF (SET/KITS/TRAYS/PACK) ×2 IMPLANT
SHEET LAVH (DRAPES) ×2 IMPLANT
TUBE CONNECTING 12X1/4 (SUCTIONS) ×2 IMPLANT

## 2020-12-31 NOTE — H&P (Signed)
Preoperative History and Physical  [name] is a 40 y.o. [id number] with No LMP recorded. admitted for a hysteroscopy uterine curettage Minerva endometrial ablation.   Current complaints: heavy longer periods since bilateral salpingectomy in July 2021, postpartum and has been on no BCM  Now last about 7d, wears super tampon and pad and soils clothes at times, some cramps, some small clots. Bad nausea. Denies abnormal discharge, itching/odor/irritation.  Bruises easily.   Placed on lo lo estrin which caused DUB  Responded to megestrol therapy  Sonogram is normal  PMH:    Past Medical History:  Diagnosis Date   Asthma    prn inhaler   Finger mass, right 03/2013   index finger   GERD (gastroesophageal reflux disease)    H/O hiatal hernia    History of esophageal stricture    multiple esophageal dilations - states x 12   Migraine    PONV (postoperative nausea and vomiting)    nausea only    PSH:     Past Surgical History:  Procedure Laterality Date   BALLOON DILATION  06/16/2011   Procedure: BALLOON DILATION;  Surgeon: Willis Modena, MD;  Location: [facility];  Service: Endoscopy;  Laterality: N/A;   BALLOON DILATION  07/07/2011   Procedure: BALLOON DILATION;  Surgeon: Willis Modena, MD;  Location: [facility];  Service: Endoscopy;  Laterality: N/A;   BALLOON DILATION  07/28/2011   Procedure: BALLOON DILATION;  Surgeon: Willis Modena, MD;  Location: [facility];  Service: Endoscopy;  Laterality: N/A;   CESAREAN SECTION  1999; 02/07/2003   CESAREAN SECTION WITH BILATERAL TUBAL LIGATION N/A 09/21/2019   Procedure: CESAREAN SECTION WITH BILATERAL TUBAL LIGATION;  Surgeon: Warden Fillers, MD;  Location: [facility];  Service: Obstetrics;  Laterality: N/A;   ESOPHAGOGASTRODUODENOSCOPY  07/07/2011   ESOPHAGOGASTRODUODENOSCOPY  06/16/2011   Procedure: ESOPHAGOGASTRODUODENOSCOPY (EGD);  Surgeon: Willis Modena, MD;  Location: [facility];  Service: Endoscopy;  Laterality: N/A;    ESOPHAGOGASTRODUODENOSCOPY  07/28/2011   Procedure: ESOPHAGOGASTRODUODENOSCOPY (EGD);  Surgeon: Willis Modena, MD;  Location: [facility];  Service: Endoscopy;  Laterality: N/A;   ESOPHAGOGASTRODUODENOSCOPY (EGD) WITH PROPOFOL N/A 06/26/2014   Procedure: ESOPHAGOGASTRODUODENOSCOPY (EGD) WITH PROPOFOL;  Surgeon: Wandalee Ferdinand, MD;  Location: [facility] ENDOSCOPY;  Service: Endoscopy;  Laterality: N/A;   FINGER MASS EXCISION Left    index finger   FOREIGN BODY REMOVAL N/A 06/26/2014   Procedure: FOREIGN BODY REMOVAL;  Surgeon: Wandalee Ferdinand, MD;  Location: [facility]COPY;  Service: Endoscopy;  Laterality: N/A;   MASS EXCISION Right 04/16/2013   Procedure: RIGHT INDEX EXCISION MASS;  Surgeon: Tami Ribas, MD;  Location: [facility];  Service: Orthopedics;  Laterality: Right;   TUBAL LIGATION      POb/GynH:      OB History     Gravida  3   Para  3   Term  3   Preterm      AB      Living  3      SAB      IAB      Ectopic      Multiple  0   Live Births  3           SH:   Social History   Tobacco Use   Smoking status: Never   Smokeless tobacco: Never  Vaping Use   Vaping Use: Never used  Substance Use Topics   Alcohol use: Yes    Comment: occ   Drug use: No  FH:    Family History  Problem Relation Age of Onset   Anesthesia problems Mother        post-op nausea   Colon cancer Father      Allergies:  Allergies  Allergen Reactions   Doxycycline Rash   Codeine Nausea And Vomiting   Sucralfate Nausea And Vomiting   Red Dye Other (See Comments)    dizziness   Augmentin [Amoxicillin-Pot Clavulanate] Rash    Pt states she recently took amoxicillin without incident, but cannot take the combination in Augmentin.   Has patient had a PCN reaction causing immediate rash, facial/tongue/throat swelling, SOB or lightheadedness with hypotension: No Has patient had a PCN reaction causing severe rash involving mucus membranes or skin necrosis: No Has patient had a PCN  reaction that required hospitalization No Has patient had a PCN reaction occurring within the last 10 years: No If all of the above answers are "NO    Medications:       Current Facility-Administered Medications:    ceFAZolin (ANCEF) IVPB 2g/100 mL premix, 2 g, Intravenous, On Call to OR, [name], MD   ketorolac (TORADOL) 30 MG/ML injection 30 mg, 30 mg, Intravenous, Once, Jessia Kief, Amaryllis Dyke, MD   povidone-iodine 10 % swab 2 application, 2 application, Topical, Once, [name], MD  Review of Systems:   Review of Systems  Constitutional: Negative for fever, chills, weight loss, malaise/fatigue and diaphoresis.  HENT: Negative for hearing loss, ear pain, nosebleeds, congestion, sore throat, neck pain, tinnitus and ear discharge.   Eyes: Negative for blurred vision, double vision, photophobia, pain, discharge and redness.  Respiratory: Negative for cough, hemoptysis, sputum production, shortness of breath, wheezing and stridor.   Cardiovascular: Negative for chest pain, palpitations, orthopnea, claudication, leg swelling and PND.  Gastrointestinal: Positive for abdominal pain. Negative for heartburn, nausea, vomiting, diarrhea, constipation, blood in stool and melena.  Genitourinary: Negative for dysuria, urgency, frequency, hematuria and flank pain.  Musculoskeletal: Negative for myalgias, back pain, joint pain and falls.  Skin: Negative for itching and rash.  Neurological: Negative for dizziness, tingling, tremors, sensory change, speech change, focal weakness, seizures, loss of consciousness, weakness and headaches.  Endo/Heme/Allergies: Negative for environmental allergies and polydipsia. Does not bruise/bleed easily.  Psychiatric/Behavioral: Negative for depression, suicidal ideas, hallucinations, memory loss and substance abuse. The patient is not nervous/anxious and does not have insomnia.      PHYSICAL EXAM:  not currently breastfeeding.    Vitals  reviewed. Constitutional: She is oriented to person, place, and time. She appears well-developed and well-nourished.  HENT:  Head: Normocephalic and atraumatic.  Right Ear: External ear normal.  Left Ear: External ear normal.  Nose: Nose normal.  Mouth/Throat: Oropharynx is clear and moist.  Eyes: Conjunctivae and EOM are normal. Pupils are equal, round, and reactive to light. Right eye exhibits no discharge. Left eye exhibits no discharge. No scleral icterus.  Neck: Normal range of motion. Neck supple. No tracheal deviation present. No thyromegaly present.  Cardiovascular: Normal rate, regular rhythm, normal heart sounds and intact distal pulses.  Exam reveals no gallop and no friction rub.   No murmur heard. Respiratory: Effort normal and breath sounds normal. No respiratory distress. She has no wheezes. She has no rales. She exhibits no tenderness.  GI: Soft. Bowel sounds are normal. She exhibits no distension and no mass. There is tenderness. There is no rebound and no guarding.  Genitourinary:       Vulva is normal without lesions Vagina is  pink moist without discharge Cervix normal in appearance and pap is normal Uterus is normal size, contour, position, consistency, mobility, non-tender Adnexa is negative with normal sized ovaries by sonogram  Musculoskeletal: Normal range of motion. She exhibits no edema and no tenderness.  Neurological: She is alert and oriented to person, place, and time. She has normal reflexes. She displays normal reflexes. No cranial nerve deficit. She exhibits normal muscle tone. Coordination normal.  Skin: Skin is warm and dry. No rash noted. No erythema. No pallor.  Psychiatric: She has a normal mood and affect. Her behavior is normal. Judgment and thought content normal.    Labs: Results for orders placed or performed during the hospital encounter of 12/29/20 (from the past 336 hour(s))  CBC   Collection Time: 12/29/20  9:16 AM  Result Value Ref Range    WBC 4.9 4.0 - 10.5 K/uL   RBC 4.46 3.87 - 5.11 MIL/uL   Hemoglobin 13.6 12.0 - 15.0 g/dL   HCT 51.7 61.6 - 07.3 %   MCV 90.8 80.0 - 100.0 fL   MCH 30.5 26.0 - 34.0 pg   MCHC 33.6 30.0 - 36.0 g/dL   RDW 71.0 62.6 - 94.8 %   Platelets 306 150 - 400 K/uL   nRBC 0.0 0.0 - 0.2 %  Comprehensive metabolic panel   Collection Time: 12/29/20  9:16 AM  Result Value Ref Range   Sodium 141 135 - 145 mmol/L   Potassium 4.0 3.5 - 5.1 mmol/L   Chloride 111 98 - 111 mmol/L   CO2 24 22 - 32 mmol/L   Glucose, Bld 100 (H) 70 - 99 mg/dL   BUN 12 6 - 20 mg/dL   Creatinine, Ser 5.46 0.44 - 1.00 mg/dL   Calcium 8.9 8.9 - 27.0 mg/dL   Total Protein 6.9 6.5 - 8.1 g/dL   Albumin 4.1 3.5 - 5.0 g/dL   AST 12 (L) 15 - 41 U/L   ALT 10 0 - 44 U/L   Alkaline Phosphatase 49 38 - 126 U/L   Total Bilirubin 1.0 0.3 - 1.2 mg/dL   GFR, Estimated >35 >00 mL/min   Anion gap 6 5 - 15  hCG, quantitative, pregnancy   Collection Time: 12/29/20  9:16 AM  Result Value Ref Range   hCG, Beta Chain, Quant, S <1 <5 mIU/mL  Rapid HIV screen (HIV 1/2 Ab+Ag)   Collection Time: 12/29/20  9:16 AM  Result Value Ref Range   HIV-1 P24 Antigen - HIV24 NON REACTIVE NON REACTIVE   HIV 1/2 Antibodies NON REACTIVE NON REACTIVE   Interpretation (HIV Ag Ab)      A non reactive test result means that HIV 1 or HIV 2 antibodies and HIV 1 p24 antigen were not detected in the specimen.  Urinalysis, Routine w reflex microscopic Urine, Clean Catch   Collection Time: 12/29/20  9:16 AM  Result Value Ref Range   Color, Urine YELLOW YELLOW   APPearance CLEAR CLEAR   Specific Gravity, Urine 1.025 1.005 - 1.030   pH 5.5 5.0 - 8.0   Glucose, UA NEGATIVE NEGATIVE mg/dL   Hgb urine dipstick NEGATIVE NEGATIVE   Bilirubin Urine NEGATIVE NEGATIVE   Ketones, ur NEGATIVE NEGATIVE mg/dL   Protein, ur NEGATIVE NEGATIVE mg/dL   Nitrite NEGATIVE NEGATIVE   Leukocytes,Ua NEGATIVE NEGATIVE    EKG: No orders found for this or any previous  visit.  Imaging Studies: US Transvaginal Non-OB  Result Date: 11/18/2020 Images from the original result were not  included.  Center for [facility] @ [facility] [address] [place] [address]                                                                                                                                   GYNECOLOGIC SONOGRAM [name] is a 40 y.o. [id number] Patient's last menstrual period was 10/22/2020. She is here for a pelvic sonogram for dysfunctional bleeding,dysmenorrhea. Uterus                      7.2 x 3.4 x 4.4 cm, Total uterine volume 55.3 cc,heterogeneous anteverted uterus,wnl Endometrium          5.3 mm, symmetrical, wnl Right ovary             2.1 x 1.9 x 1 cm, wnl Left ovary                2.2 x 1.7 x 1.8 cm, wnl No free fluid Technician Comments: PELVIC US TA/TV: heterogeneous anteverted uterus,wnl,EEC 5.3 mm,normal ovaries,ovaries appear mobile,no free fluid ,no pain during ultrasound Chaperone [address] Flora Lipps 11/18/2020 4:24 PM Clinical Impression and recommendations: I have reviewed the sonogram results above, combined with the patient's current clinical course, below are my impressions and any appropriate recommendations for management based on the sonographic findings. Uterus is normal size shape and contour Endometrium is normal Both ovaries are normal size shape and morphology [name] 11/18/2020 4:28 PM   US PELVIS (TRANSABDOMINAL ONLY)  Result Date: 11/18/2020 Images from the original result were not included.  Center for [facility] @ [facility] [address] [place] [address]                                                                                                                                   GYNECOLOGIC SONOGRAM [name] is a 40 y.o. [id number] Patient's last menstrual period was 10/22/2020. She is here for a pelvic sonogram for dysfunctional bleeding,dysmenorrhea. Uterus                      7.2 x  3.4 x 4.4 cm, Total uterine volume 55.3 cc,heterogeneous anteverted uterus,wnl Endometrium          5.3 mm, symmetrical, wnl Right ovary  2.1 x 1.9 x 1 cm, wnl Left ovary                2.2 x 1.7 x 1.8 cm, wnl No free fluid Technician Comments: PELVIC US TA/TV: heterogeneous anteverted uterus,wnl,EEC 5.3 mm,normal ovaries,ovaries appear mobile,no free fluid ,no pain during ultrasound Chaperone [address] Flora Lipps 11/18/2020 4:24 PM Clinical Impression and recommendations: I have reviewed the sonogram results above, combined with the patient's current clinical course, below are my impressions and any appropriate recommendations for management based on the sonographic findings. Uterus is normal size shape and contour Endometrium is normal Both ovaries are normal size shape and morphology [name] 11/18/2020 4:28 PM       Assessment: Menorrhagia on no hormonal management DUB on low dose COC Dysmenorrhea on either Well controlled on megestrol therapy  Plan: Hysteroscopy uterine curettage Minerva endometrial ablation  [name] 12/31/2020 7:16 AM

## 2020-12-31 NOTE — Anesthesia Procedure Notes (Signed)
Procedure Name: LMA Insertion Date/Time: 12/31/2020 8:36 AM Performed by: Lorin Glass, CRNA Pre-anesthesia Checklist: Patient identified, Emergency Drugs available, Suction available and Patient being monitored Patient Re-evaluated:Patient Re-evaluated prior to induction Oxygen Delivery Method: Circle system utilized Preoxygenation: Pre-oxygenation with 100% oxygen Induction Type: IV induction LMA: LMA inserted LMA Size: 4.0 Number of attempts: 1 Placement Confirmation: breath sounds checked- equal and bilateral and positive ETCO2 Tube secured with: Tape Dental Injury: Teeth and Oropharynx as per pre-operative assessment

## 2020-12-31 NOTE — Anesthesia Preprocedure Evaluation (Signed)
Anesthesia Evaluation  Patient identified by MRN, date of birth, ID band Patient awake    Reviewed: Allergy & Precautions, NPO status , Patient's Chart, lab work & pertinent test results  History of Anesthesia Complications (+) PONV and history of anesthetic complications  Airway Mallampati: II  TM Distance: >3 FB Neck ROM: Full    Dental  (+) Dental Advisory Given, Chipped, Missing, Poor Dentition   Pulmonary asthma ,    Pulmonary exam normal breath sounds clear to auscultation       Cardiovascular Exercise Tolerance: Good hypertension, Pt. on medications Normal cardiovascular exam Rhythm:Regular Rate:Normal     Neuro/Psych  Headaches, PSYCHIATRIC DISORDERS Anxiety Depression    GI/Hepatic Neg liver ROS, hiatal hernia, GERD  Medicated and Controlled,  Endo/Other  negative endocrine ROS  Renal/GU negative Renal ROS     Musculoskeletal negative musculoskeletal ROS (+)   Abdominal   Peds  Hematology negative hematology ROS (+)   Anesthesia Other Findings   Reproductive/Obstetrics negative OB ROS                            Anesthesia Physical Anesthesia Plan  ASA: 2  Anesthesia Plan: General   Post-op Pain Management:    Induction: Intravenous  PONV Risk Score and Plan: 4 or greater and Ondansetron, Dexamethasone, Midazolam and Scopolamine patch - Pre-op  Airway Management Planned: LMA  Additional Equipment:   Intra-op Plan:   Post-operative Plan: Extubation in OR  Informed Consent: I have reviewed the patients History and Physical, chart, labs and discussed the procedure including the risks, benefits and alternatives for the proposed anesthesia with the patient or authorized representative who has indicated his/her understanding and acceptance.     Dental advisory given  Plan Discussed with: CRNA and Surgeon  Anesthesia Plan Comments:        Anesthesia Quick  Evaluation

## 2020-12-31 NOTE — Op Note (Signed)
Preoperative diagnosis:  1.   menorrhagia                                         2.  DUB                                         3.  Dysmenorrhea   Postoperative diagnoses: Same as above   Procedure: Hysteroscopy, uterine curettage, endometrial ablation using Minerva  Surgeon: [name]   Anesthesia: Laryngeal mask airway  Findings: The endometrium was normal. There were no fibroid or other abnormalities.  Description of operation: The patient was taken to the operating room and placed in the supine position. She underwent general anesthesia using the laryngeal mask airway. She was placed in the dorsal lithotomy position and prepped and draped in the usual sterile fashion. A Graves speculum was placed and the anterior cervical lip was grasped with a single-tooth tenaculum. The cervix was dilated serially to allow passage of the hysteroscope. Diagnostic hysteroscopy was performed and was found to be normal. A vigorous uterine curettage was then performed and all tissue sent to pathology for evaluation.  I then proceeded to perform the Minerva endometrial ablation.   The uterus sounded to 9 cm The handpiece was attached to the Minerva power source/machine and the handpiece passed the checklist. The array was squeezed down to remove all of the air present.  The array was then place into the endometrial cavity and deployed to a length of 6 cm. The handpiece confirmed appropriate width by being in the green portion of the visual dial. The cervical cuff was then inflated to the point the CO2 indicator was in the green. The endometrial integrity check was then performed and integrity sequence was confirmed x 2. The heating was then begun and carried out for a total of 2 minutes(which is standard therapy time). When the plasma cycle was finished,  the cervical cuff was deflated and the array was removed with tissue present on the silicon membrane. There was appropriate post Minerva bleeding  and uterine discharge.     All of the equipment worked well throughout the procedure.  The patient was awakened from anesthesia and taken to the recovery room in good stable condition all counts were correct. She received 2 g of Ancef and 30 mg of Toradol preoperatively. She will be discharged from the recovery room and followed up in the office in 1- 2 weeks.   She can expect 4 weeks of post procedure bloody watery discharge  [name], MD  12/31/2020 9:15 AM

## 2020-12-31 NOTE — Anesthesia Postprocedure Evaluation (Signed)
Anesthesia Post Note  Patient: [name]  Procedure(s) Performed: DILATATION AND CURETTAGE/HYSTEROSCOPY WITH MINERVA EDOMETRIAL ABLATION (Vagina )  Patient location during evaluation: PACU Anesthesia Type: General Level of consciousness: awake and alert and oriented Pain management: pain level controlled Vital Signs Assessment: post-procedure vital signs reviewed and stable Respiratory status: spontaneous breathing, nonlabored ventilation and respiratory function stable Cardiovascular status: blood pressure returned to baseline and stable Postop Assessment: no apparent nausea or vomiting Anesthetic complications: no   No notable events documented.   Last Vitals:  Vitals:   12/31/20 0930 12/31/20 0953  BP: (!) 141/76 (!) 148/86  Pulse: (!) 55 (!) 56  Resp: (!) 26 20  Temp:  36.5 C  SpO2: 96% 99%    Last Pain:  Vitals:   12/31/20 0953  TempSrc: Oral  PainSc: 0-No pain                 [name] C Yossef Gilkison

## 2020-12-31 NOTE — Transfer of Care (Signed)
Immediate Anesthesia Transfer of Care Note  Patient: [name]  Procedure(s) Performed: DILATATION AND CURETTAGE/HYSTEROSCOPY WITH MINERVA EDOMETRIAL ABLATION (Vagina )  Patient Location: PACU  Anesthesia Type:General  Level of Consciousness: awake and alert   Airway & Oxygen Therapy: Patient Spontanous Breathing and Patient connected to nasal cannula oxygen  Post-op Assessment: Report given to RN and Post -op Vital signs reviewed and stable  Post vital signs: Reviewed and stable  Last Vitals:  Vitals Value Taken Time  BP 125/80   Temp    Pulse 76   Resp 18 12/31/20 0919  SpO2 99%   Vitals shown include unvalidated device data.  Last Pain:  Vitals:   12/31/20 0731  TempSrc: Oral         Complications: No notable events documented.

## 2021-01-01 ENCOUNTER — Encounter (HOSPITAL_COMMUNITY): Payer: Self-pay | Admitting: Obstetrics & Gynecology

## 2021-01-01 ENCOUNTER — Ambulatory Visit
Admission: RE | Admit: 2021-01-01 | Discharge: 2021-01-01 | Disposition: A | Discharge status: Home / Self Care | Source: Ambulatory Visit | Attending: Family Medicine | Admitting: Family Medicine

## 2021-01-01 DIAGNOSIS — Z1231 Encounter for screening mammogram for malignant neoplasm of breast: Secondary | ICD-10-CM

## 2021-01-01 LAB — SURGICAL PATHOLOGY

## 2021-01-02 ENCOUNTER — Encounter: Payer: Self-pay | Admitting: Obstetrics & Gynecology

## 2021-01-02 ENCOUNTER — Telehealth (INDEPENDENT_AMBULATORY_CARE_PROVIDER_SITE_OTHER): Admitting: Obstetrics & Gynecology

## 2021-01-02 DIAGNOSIS — Z9889 Other specified postprocedural states: Secondary | ICD-10-CM

## 2021-01-02 NOTE — Progress Notes (Signed)
My Chart video visit Patient is at home I am in my office Total time: 10 minutes   HPI: Patient returns for routine postoperative follow-up having undergone hysteroscopy uterine curettage Minerva ednometrial ablation on 12/24/20.  The patient's immediate postoperative recovery has been unremarkable. Since hospital discharge the patient reports no problems .   Current Outpatient Medications: HYDROcodone-acetaminophen (NORCO/VICODIN) 5-325 MG tablet, Take 1 tablet by mouth every 6 (six) hours as needed. (Patient not taking: Reported on 01/02/2021), Disp: 10 tablet, Rfl: 0 ketorolac (TORADOL) 10 MG tablet, Take 1 tablet (10 mg total) by mouth every 8 (eight) hours as needed. (Patient not taking: Reported on 01/02/2021), Disp: 15 tablet, Rfl: 0 ondansetron (ZOFRAN ODT) 4 MG disintegrating tablet, Take 1 tablet (4 mg total) by mouth every 8 (eight) hours as needed for nausea or vomiting. (Patient not taking: Reported on 01/02/2021), Disp: 20 tablet, Rfl: 0 ondansetron (ZOFRAN ODT) 8 MG disintegrating tablet, Take 1 tablet (8 mg total) by mouth every 8 (eight) hours as needed for nausea or vomiting. (Patient not taking: Reported on 01/02/2021), Disp: 8 tablet, Rfl: 0  No current facility-administered medications for this visit.    not currently breastfeeding.  Physical Exam: Gen WDWN NAD  Diagnostic Tests:   Pathology: benign  Impression: 1. S/P hysteroscopy uterine curettage Minerva endometrial ablation 12/24/20      Plan:     Follow up: Return if symptoms worsen or fail to improve.    [name], MD

## 2021-01-05 ENCOUNTER — Encounter: Payer: Self-pay | Admitting: *Deleted

## 2021-01-06 ENCOUNTER — Other Ambulatory Visit: Payer: Self-pay | Admitting: Family Medicine

## 2021-01-06 DIAGNOSIS — R928 Other abnormal and inconclusive findings on diagnostic imaging of breast: Secondary | ICD-10-CM

## 2021-01-09 ENCOUNTER — Ambulatory Visit
Admission: RE | Admit: 2021-01-09 | Discharge: 2021-01-09 | Disposition: A | Discharge status: Home / Self Care | Source: Ambulatory Visit | Attending: Family Medicine | Admitting: Family Medicine

## 2021-01-09 ENCOUNTER — Other Ambulatory Visit: Payer: Self-pay

## 2021-01-09 ENCOUNTER — Ambulatory Visit

## 2021-01-09 DIAGNOSIS — R928 Other abnormal and inconclusive findings on diagnostic imaging of breast: Secondary | ICD-10-CM

## 2021-01-22 ENCOUNTER — Encounter: Payer: Self-pay | Admitting: Obstetrics & Gynecology

## 2021-01-22 ENCOUNTER — Telehealth (INDEPENDENT_AMBULATORY_CARE_PROVIDER_SITE_OTHER): Admitting: Obstetrics & Gynecology

## 2021-01-22 VITALS — Ht 61.0 in | Wt 138.0 lb

## 2021-01-22 DIAGNOSIS — Z9889 Other specified postprocedural states: Secondary | ICD-10-CM

## 2021-01-22 DIAGNOSIS — Z09 Encounter for follow-up examination after completed treatment for conditions other than malignant neoplasm: Secondary | ICD-10-CM

## 2021-01-22 NOTE — Progress Notes (Signed)
My Chart video visit Patient is at an office I am in my office Total time: 10 minutes   HPI: Patient returns for routine postoperative follow-up having undergone hysteroscopy uterine curettage Minerva ednometrial ablation on 12/24/20.  The patient's immediate postoperative recovery has been unremarkable. Since hospital discharge the patient reports no problems .   Current Outpatient Medications: HYDROcodone-acetaminophen (NORCO/VICODIN) 5-325 MG tablet, Take 1 tablet by mouth every 6 (six) hours as needed. (Patient not taking: Reported on 01/02/2021), Disp: 10 tablet, Rfl: 0 ketorolac (TORADOL) 10 MG tablet, Take 1 tablet (10 mg total) by mouth every 8 (eight) hours as needed. (Patient not taking: Reported on 01/02/2021), Disp: 15 tablet, Rfl: 0 ondansetron (ZOFRAN ODT) 4 MG disintegrating tablet, Take 1 tablet (4 mg total) by mouth every 8 (eight) hours as needed for nausea or vomiting. (Patient not taking: Reported on 01/02/2021), Disp: 20 tablet, Rfl: 0 ondansetron (ZOFRAN ODT) 8 MG disintegrating tablet, Take 1 tablet (8 mg total) by mouth every 8 (eight) hours as needed for nausea or vomiting. (Patient not taking: Reported on 01/02/2021), Disp: 8 tablet, Rfl: 0  No current facility-administered medications for this visit.    not currently breastfeeding.  Physical Exam: Gen WDWN NAD  Diagnostic Tests:   Pathology: benign  Impression: 1. S/P hysteroscopy uterine curettage Minerva endometrial ablation 12/24/20      Plan:     Follow up: No follow-ups on file.    [name], MD

## 2021-01-23 ENCOUNTER — Encounter

## 2021-01-23 ENCOUNTER — Other Ambulatory Visit

## 2021-01-27 ENCOUNTER — Encounter

## 2021-01-27 ENCOUNTER — Other Ambulatory Visit

## 2021-01-28 ENCOUNTER — Other Ambulatory Visit: Payer: Self-pay

## 2021-01-28 ENCOUNTER — Encounter: Payer: Self-pay | Admitting: Emergency Medicine

## 2021-01-28 ENCOUNTER — Ambulatory Visit
Admission: EM | Admit: 2021-01-28 | Discharge: 2021-01-28 | Disposition: A | Discharge status: Home / Self Care | Attending: Internal Medicine | Admitting: Internal Medicine

## 2021-01-28 DIAGNOSIS — J029 Acute pharyngitis, unspecified: Secondary | ICD-10-CM | POA: Diagnosis present

## 2021-01-28 DIAGNOSIS — J018 Other acute sinusitis: Secondary | ICD-10-CM | POA: Insufficient documentation

## 2021-01-28 LAB — POCT RAPID STREP A (OFFICE): Rapid Strep A Screen: NEGATIVE

## 2021-01-28 MED ORDER — AMOXICILLIN 500 MG PO CAPS: 500.0000 mg | ORAL_CAPSULE | Freq: Two times a day (BID) | ORAL | 0 refills | Status: DC

## 2021-01-28 NOTE — ED Triage Notes (Signed)
On 01/20/2021 began having facial pain, nasal congestion, runny nose, and sore throat. Hx of sinus infections, works with children.

## 2021-01-28 NOTE — ED Provider Notes (Signed)
[name]    CSN: [id number] Arrival date & time: 01/28/21  9371      History   Chief Complaint Chief Complaint  Patient presents with   Facial Pain   Sore Throat    HPI [name] is a 40 y.o. female.   Patient presents with 8 day history of facial pain, nasal congestion, runny nose, sore throat.  Patient reports that she has history of recurrent sinus infections.  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  She has taken NyQuil and DayQuil with minimal improvement in symptoms.  Patient denies any cough, shortness of breath, chest pain, nausea, vomiting, diarrhea, abdominal pain.  Denies any known fevers.   Sore Throat   Past Medical History:  Diagnosis Date   Asthma    prn inhaler   Finger mass, right 03/2013   index finger   GERD (gastroesophageal reflux disease)    H/O hiatal hernia    History of esophageal stricture    multiple esophageal dilations - states x 12   Migraine    PONV (postoperative nausea and vomiting)    nausea only    Patient Active Problem List   Diagnosis Date Noted   Menorrhagia with regular cycle    DUB (dysfunctional uterine bleeding)    Dysmenorrhea    History of bilateral tubal ligation 09/21/2019   Preeclampsia, severe 09/20/2019   History of abnormal cervical Pap smear 08/07/2019   Previous cesarean section 07/09/2019   AMA (advanced maternal age) multigravida 35+ 07/09/2019   Pregnant state, gestational carrier 03/19/2019   Supervision of high risk pregnancy, antepartum 03/19/2019   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   Family history of colon cancer in father 05/17/2018   Anxiety and depression 11/30/2011   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   EE (eosinophilic esophagitis) 07/26/2011   Mild intermittent asthma 07/26/2011   ASTHMA 11/14/2008   GERD 11/14/2008   DYSPHAGIA UNSPECIFIED 11/14/2008    Past Surgical History:   Procedure Laterality Date   BALLOON DILATION  06/16/2011   Procedure: BALLOON DILATION;  Surgeon: Willis Modena, MD;  Location: [facility];  Service: Endoscopy;  Laterality: N/A;   BALLOON DILATION  07/07/2011   Procedure: BALLOON DILATION;  Surgeon: Willis Modena, MD;  Location: [facility];  Service: Endoscopy;  Laterality: N/A;   BALLOON DILATION  07/28/2011   Procedure: BALLOON DILATION;  Surgeon: Willis Modena, MD;  Location: [facility];  Service: Endoscopy;  Laterality: N/A;   CESAREAN SECTION  1999; 02/07/2003   CESAREAN SECTION WITH BILATERAL TUBAL LIGATION N/A 09/21/2019   Procedure: CESAREAN SECTION WITH BILATERAL TUBAL LIGATION;  Surgeon: Warden Fillers, MD;  Location: [facility];  Service: Obstetrics;  Laterality: N/A;   DILATATION AND CURETTAGE/HYSTEROSCOPY WITH MINERVA N/A 12/31/2020   Procedure: DILATATION AND CURETTAGE/HYSTEROSCOPY WITH MINERVA EDOMETRIAL ABLATION;  Surgeon: [name], MD;  Location: [facility];  Service: Gynecology;  Laterality: N/A;   ESOPHAGOGASTRODUODENOSCOPY  07/07/2011   ESOPHAGOGASTRODUODENOSCOPY  06/16/2011   Procedure: ESOPHAGOGASTRODUODENOSCOPY (EGD);  Surgeon: Willis Modena, MD;  Location: [facility];  Service: Endoscopy;  Laterality: N/A;   ESOPHAGOGASTRODUODENOSCOPY  07/28/2011   Procedure: ESOPHAGOGASTRODUODENOSCOPY (EGD);  Surgeon: Willis Modena, MD;  Location: [facility];  Service: Endoscopy;  Laterality: N/A;   ESOPHAGOGASTRODUODENOSCOPY (EGD) WITH PROPOFOL N/A 06/26/2014   Procedure: ESOPHAGOGASTRODUODENOSCOPY (EGD) WITH PROPOFOL;  Surgeon: Wandalee Ferdinand, MD;  Location: [facility]COPY;  Service: Endoscopy;  Laterality: N/A;   FINGER  MASS EXCISION Left    index finger   FOREIGN BODY REMOVAL N/A 06/26/2014   Procedure: FOREIGN BODY REMOVAL;  Surgeon: Wandalee Ferdinand, MD;  Location: [facility] END[facility]COPY;  Service: Endoscopy;  Laterality: N/A;   MASS EXCISION Right 04/16/2013   Procedure: RIGHT INDEX EXCISION MASS;  Surgeon: Tami Ribas, MD;  Location: [facility];  Service: Orthopedics;  Laterality: Right;   TUBAL LIGATION      OB History     Gravida  3   Para  3   Term  3   Preterm      AB      Living  3      SAB      IAB      Ectopic      Multiple  0   Live Births  3            Home Medications    Prior to Admission medications   Medication Sig Start Date End Date Taking? Authorizing Provider  amoxicillin (AMOXIL) 500 MG capsule Take 1 capsule (500 mg total) by mouth 2 (two) times daily for 7 days. 01/28/21 02/04/21 Yes Zurie Platas, Acie Fredrickson, FNP  ondansetron (ZOFRAN ODT) 4 MG disintegrating tablet Take 1 tablet (4 mg total) by mouth every 8 (eight) hours as needed for nausea or vomiting. 12/29/20   Cheral Marker, CNM  ondansetron (ZOFRAN ODT) 8 MG disintegrating tablet Take 1 tablet (8 mg total) by mouth every 8 (eight) hours as needed for nausea or vomiting. 12/31/20   [name], MD    Family History Family History  Problem Relation Age of Onset   Anesthesia problems Mother        post-op nausea   Colon cancer Father     Social History Social History   Tobacco Use   Smoking status: Never   Smokeless tobacco: Never  Vaping Use   Vaping Use: Never used  Substance Use Topics   Alcohol use: Yes    Comment: occ   Drug use: No     Allergies   Doxycycline, Codeine, Sucralfate, Red dye, and Augmentin [amoxicillin-pot clavulanate]   Review of Systems Review of Systems Per HPI  Physical Exam Triage Vital Signs ED Triage Vitals  Enc Vitals Group     BP 01/28/21 0853 126/87     Pulse Rate 01/28/21 0853 (!) 53     Resp 01/28/21 0853 16     Temp 01/28/21 0853 98 F (36.7 C)     Temp Source 01/28/21 0853 Oral     SpO2 01/28/21 0853 99 %     Weight --      Height --      Head Circumference --      Peak Flow --      Pain Score 01/28/21 0854 7     Pain Loc --      Pain Edu? --      Excl. in GC? --    No data found.  Updated Vital Signs BP 126/87 (BP Location: Left Arm)    Pulse (!) 53 Comment: Patient wearing thick fake nails  Temp 98 F (36.7 C) (Oral)   Resp 16   SpO2 99%   Visual Acuity Right Eye Distance:   Left Eye Distance:   Bilateral Distance:    Right Eye Near:   Left Eye Near:    Bilateral Near:     Physical Exam Constitutional:      General: She is not  in acute distress.    Appearance: Normal appearance. She is not toxic-appearing or diaphoretic.  HENT:     Head: Normocephalic and atraumatic.     Right Ear: Tympanic membrane and ear canal normal.     Left Ear: Tympanic membrane and ear canal normal.     Nose: Congestion present.     Right Sinus: Maxillary sinus tenderness present.     Left Sinus: Maxillary sinus tenderness present.     Mouth/Throat:     Mouth: Mucous membranes are moist.     Pharynx: Posterior oropharyngeal erythema present.  Eyes:     Extraocular Movements: Extraocular movements intact.     Conjunctiva/sclera: Conjunctivae normal.     Pupils: Pupils are equal, round, and reactive to light.  Cardiovascular:     Rate and Rhythm: Normal rate and regular rhythm.     Pulses: Normal pulses.     Heart sounds: Normal heart sounds.  Pulmonary:     Effort: Pulmonary effort is normal. No respiratory distress.     Breath sounds: Normal breath sounds. No wheezing.  Abdominal:     General: Abdomen is flat. Bowel sounds are normal.     Palpations: Abdomen is soft.  Musculoskeletal:        General: Normal range of motion.     Cervical back: Normal range of motion.  Skin:    General: Skin is warm and dry.  Neurological:     General: No focal deficit present.     Mental Status: She is alert and oriented to person, place, and time. Mental status is at baseline.  Psychiatric:        Mood and Affect: Mood normal.        Behavior: Behavior normal.     UC Treatments / Results  Labs (all labs ordered are listed, but only abnormal results are displayed) Labs Reviewed  CULTURE, GROUP A STREP [facility])  POCT RAPID STREP A  (OFFICE)    EKG   Radiology No results found.  Procedures Procedures (including critical care time)  Medications Ordered in UC Medications - No data to display  Initial Impression / Assessment and Plan / UC Course  I have reviewed the triage vital signs and the nursing notes.  Pertinent labs & imaging results that were available during my care of the patient were reviewed by me and considered in my medical decision making (see chart for details).     Physical exam is consistent with acute sinusitis.  Will treat with amoxicillin x7 days.  Patient has Augmentin allergy but states that she can take regular amoxicillin.  Patient has a red dye allergy as well.  Pharmacist was called to confirm which antibiotics do not contain red dye, and pharmacist stated that amoxicillin capsules do not have red dye.  Will prescribe capsules of amoxicillin.  Advised patient to confirm this once she gets to the pharmacy as well.  COVID-19 testing was offered but patient declined.  Discussed strict return precautions.  Patient verbalized understanding and was agreeable plan. Final Clinical Impressions(s) / UC Diagnoses   Final diagnoses:  Sore throat  Acute non-recurrent sinusitis of other sinus     Discharge Instructions      You have been prescribed antibiotics to treat upper respiratory infection. Please follow up if symptoms persist. Your rapid strep test was negative. Throat culture is pending. We will call if it is positive.      ED Prescriptions     Medication Sig Dispense Auth. Provider  amoxicillin (AMOXIL) 500 MG capsule Take 1 capsule (500 mg total) by mouth 2 (two) times daily for 7 days. 14 capsule [place], Acie Fredrickson, [place]      PDMP not reviewed this encounter.   Gustavus Bryant, [place] 01/28/21 [id number]

## 2021-01-28 NOTE — Discharge Instructions (Addendum)
You have been prescribed antibiotics to treat upper respiratory infection. Please follow up if symptoms persist. Your rapid strep test was negative. Throat culture is pending. We will call if it is positive.

## 2021-01-31 LAB — CULTURE, GROUP A STREP (THRC)

## 2021-02-01 ENCOUNTER — Encounter (HOSPITAL_BASED_OUTPATIENT_CLINIC_OR_DEPARTMENT_OTHER): Payer: Self-pay | Admitting: Emergency Medicine

## 2021-02-01 ENCOUNTER — Other Ambulatory Visit: Payer: Self-pay

## 2021-02-01 DIAGNOSIS — J45909 Unspecified asthma, uncomplicated: Secondary | ICD-10-CM | POA: Insufficient documentation

## 2021-02-01 DIAGNOSIS — R519 Headache, unspecified: Secondary | ICD-10-CM | POA: Diagnosis not present

## 2021-02-01 DIAGNOSIS — F32A Depression, unspecified: Secondary | ICD-10-CM | POA: Diagnosis not present

## 2021-02-01 DIAGNOSIS — Z20822 Contact with and (suspected) exposure to covid-19: Secondary | ICD-10-CM | POA: Insufficient documentation

## 2021-02-01 DIAGNOSIS — K3580 Unspecified acute appendicitis: Secondary | ICD-10-CM | POA: Diagnosis present

## 2021-02-01 DIAGNOSIS — I10 Essential (primary) hypertension: Secondary | ICD-10-CM | POA: Diagnosis not present

## 2021-02-01 DIAGNOSIS — F419 Anxiety disorder, unspecified: Secondary | ICD-10-CM | POA: Insufficient documentation

## 2021-02-01 DIAGNOSIS — K219 Gastro-esophageal reflux disease without esophagitis: Secondary | ICD-10-CM | POA: Insufficient documentation

## 2021-02-01 LAB — CBC
HCT: 38.3 % (ref 36.0–46.0)
Hemoglobin: 12.9 g/dL (ref 12.0–15.0)
MCH: 29.8 pg (ref 26.0–34.0)
MCHC: 33.7 g/dL (ref 30.0–36.0)
MCV: 88.5 fL (ref 80.0–100.0)
Platelets: 316 10*3/uL (ref 150–400)
RBC: 4.33 MIL/uL (ref 3.87–5.11)
RDW: 13.5 % (ref 11.5–15.5)
WBC: 8.6 10*3/uL (ref 4.0–10.5)
nRBC: 0 % (ref 0.0–0.2)

## 2021-02-01 LAB — COMPREHENSIVE METABOLIC PANEL
ALT: 23 U/L (ref 0–44)
AST: 20 U/L (ref 15–41)
Albumin: 3.8 g/dL (ref 3.5–5.0)
Alkaline Phosphatase: 84 U/L (ref 38–126)
Anion gap: 10 (ref 5–15)
BUN: 13 mg/dL (ref 6–20)
CO2: 21 mmol/L — ABNORMAL LOW (ref 22–32)
Calcium: 9 mg/dL (ref 8.9–10.3)
Chloride: 103 mmol/L (ref 98–111)
Creatinine, Ser: 0.54 mg/dL (ref 0.44–1.00)
GFR, Estimated: >60 mL/min (ref >60)
Glucose, Bld: 99 mg/dL (ref 70–99)
Potassium: 3.6 mmol/L (ref 3.5–5.1)
Sodium: 134 mmol/L — ABNORMAL LOW (ref 135–145)
Total Bilirubin: 0.7 mg/dL (ref 0.3–1.2)
Total Protein: 7.1 g/dL (ref 6.5–8.1)

## 2021-02-01 LAB — LIPASE, BLOOD: Lipase: 27 U/L (ref 11–51)

## 2021-02-01 NOTE — ED Notes (Signed)
Pt is aware that urine sample is needed, she will attempt to catch later.

## 2021-02-01 NOTE — ED Triage Notes (Signed)
Pt reports intermittent RLQ since Fri pm; +diarrhea

## 2021-02-02 ENCOUNTER — Encounter (HOSPITAL_COMMUNITY): Payer: Self-pay | Admitting: *Deleted

## 2021-02-02 ENCOUNTER — Ambulatory Visit: Admit: 2021-02-02 | Admitting: Surgery

## 2021-02-02 ENCOUNTER — Encounter (HOSPITAL_COMMUNITY)
Admission: EM | Disposition: A | Discharge status: Home / Self Care | Payer: Self-pay | Source: Home / Self Care | Attending: Emergency Medicine

## 2021-02-02 ENCOUNTER — Emergency Department (HOSPITAL_BASED_OUTPATIENT_CLINIC_OR_DEPARTMENT_OTHER)

## 2021-02-02 ENCOUNTER — Emergency Department (HOSPITAL_COMMUNITY): Admitting: Anesthesiology

## 2021-02-02 ENCOUNTER — Ambulatory Visit (HOSPITAL_BASED_OUTPATIENT_CLINIC_OR_DEPARTMENT_OTHER)
Admission: EM | Admit: 2021-02-02 | Discharge: 2021-02-02 | Disposition: A | Discharge status: Home / Self Care | Attending: Emergency Medicine | Admitting: Emergency Medicine

## 2021-02-02 DIAGNOSIS — K3580 Unspecified acute appendicitis: Secondary | ICD-10-CM | POA: Diagnosis present

## 2021-02-02 DIAGNOSIS — J45909 Unspecified asthma, uncomplicated: Secondary | ICD-10-CM | POA: Diagnosis not present

## 2021-02-02 DIAGNOSIS — R519 Headache, unspecified: Secondary | ICD-10-CM | POA: Diagnosis not present

## 2021-02-02 DIAGNOSIS — F32A Depression, unspecified: Secondary | ICD-10-CM | POA: Diagnosis not present

## 2021-02-02 DIAGNOSIS — I10 Essential (primary) hypertension: Secondary | ICD-10-CM | POA: Diagnosis not present

## 2021-02-02 DIAGNOSIS — F419 Anxiety disorder, unspecified: Secondary | ICD-10-CM | POA: Diagnosis not present

## 2021-02-02 DIAGNOSIS — Z20822 Contact with and (suspected) exposure to covid-19: Secondary | ICD-10-CM | POA: Diagnosis not present

## 2021-02-02 DIAGNOSIS — K219 Gastro-esophageal reflux disease without esophagitis: Secondary | ICD-10-CM | POA: Diagnosis not present

## 2021-02-02 HISTORY — PX: LAPAROSCOPIC APPENDECTOMY: SHX408

## 2021-02-02 LAB — PREGNANCY, URINE: Preg Test, Ur: NEGATIVE

## 2021-02-02 LAB — URINALYSIS, ROUTINE W REFLEX MICROSCOPIC
Bilirubin Urine: NEGATIVE
Glucose, UA: NEGATIVE mg/dL
Hgb urine dipstick: NEGATIVE
Ketones, ur: NEGATIVE mg/dL
Leukocytes,Ua: NEGATIVE
Nitrite: NEGATIVE
Protein, ur: NEGATIVE mg/dL
Specific Gravity, Urine: 1.02 (ref 1.005–1.030)
pH: 7 (ref 5.0–8.0)

## 2021-02-02 LAB — RESP PANEL BY RT-PCR (FLU A&B, COVID) ARPGX2
Influenza A by PCR: NEGATIVE
Influenza B by PCR: NEGATIVE
SARS Coronavirus 2 by RT PCR: NEGATIVE

## 2021-02-02 SURGERY — APPENDECTOMY, LAPAROSCOPIC
Anesthesia: General

## 2021-02-02 MED ORDER — KETAMINE HCL 10 MG/ML IJ SOLN
INTRAMUSCULAR | Status: AC
  Filled 2021-02-02: qty 1

## 2021-02-02 MED ORDER — METRONIDAZOLE 500 MG/100ML IV SOLN
500.0000 mg | Freq: Once | INTRAVENOUS | Status: AC
  Administered 2021-02-02: 500 mg via INTRAVENOUS
  Filled 2021-02-02: qty 100

## 2021-02-02 MED ORDER — SUCCINYLCHOLINE CHLORIDE 200 MG/10ML IV SOSY
PREFILLED_SYRINGE | INTRAVENOUS | Status: DC | PRN
  Administered 2021-02-02: 160 mg via INTRAVENOUS

## 2021-02-02 MED ORDER — ONDANSETRON HCL 4 MG/2ML IJ SOLN
INTRAMUSCULAR | Status: AC
  Filled 2021-02-02: qty 2

## 2021-02-02 MED ORDER — ONDANSETRON HCL 4 MG/2ML IJ SOLN
INTRAMUSCULAR | Status: DC | PRN
  Administered 2021-02-02: 4 mg via INTRAVENOUS

## 2021-02-02 MED ORDER — GLYCOPYRROLATE 0.2 MG/ML IJ SOLN
INTRAMUSCULAR | Status: AC
  Filled 2021-02-02: qty 1

## 2021-02-02 MED ORDER — LIDOCAINE 2% (20 MG/ML) 5 ML SYRINGE
INTRAMUSCULAR | Status: DC | PRN
  Administered 2021-02-02: 40 mg via INTRAVENOUS

## 2021-02-02 MED ORDER — OXYCODONE HCL 5 MG/5ML PO SOLN: 5.0000 mg | Freq: Once | ORAL | Status: DC | PRN

## 2021-02-02 MED ORDER — PHENYLEPHRINE 40 MCG/ML (10ML) SYRINGE FOR IV PUSH (FOR BLOOD PRESSURE SUPPORT)
PREFILLED_SYRINGE | INTRAVENOUS | Status: DC | PRN
  Administered 2021-02-02: 80 ug via INTRAVENOUS

## 2021-02-02 MED ORDER — SODIUM CHLORIDE 0.9 % IV SOLN
1.0000 g | Freq: Once | INTRAVENOUS | Status: AC
  Administered 2021-02-02: 1 g via INTRAVENOUS
  Filled 2021-02-02: qty 10

## 2021-02-02 MED ORDER — PROPOFOL 10 MG/ML IV BOLUS
INTRAVENOUS | Status: AC
  Filled 2021-02-02: qty 40

## 2021-02-02 MED ORDER — ROCURONIUM BROMIDE 10 MG/ML (PF) SYRINGE
PREFILLED_SYRINGE | INTRAVENOUS | Status: DC | PRN
  Administered 2021-02-02: 40 mg via INTRAVENOUS

## 2021-02-02 MED ORDER — FENTANYL CITRATE (PF) 100 MCG/2ML IJ SOLN
INTRAMUSCULAR | Status: AC
  Filled 2021-02-02: qty 2

## 2021-02-02 MED ORDER — SODIUM CHLORIDE 0.9 % IV BOLUS
1000.0000 mL | Freq: Once | INTRAVENOUS | Status: AC
  Administered 2021-02-02: 1000 mL via INTRAVENOUS

## 2021-02-02 MED ORDER — MIDAZOLAM HCL 2 MG/2ML IJ SOLN
INTRAMUSCULAR | Status: AC
  Filled 2021-02-02: qty 2

## 2021-02-02 MED ORDER — ONDANSETRON HCL 4 MG/2ML IJ SOLN
4.0000 mg | Freq: Once | INTRAMUSCULAR | Status: AC | PRN
  Administered 2021-02-02: 4 mg via INTRAVENOUS

## 2021-02-02 MED ORDER — MIDAZOLAM HCL 2 MG/2ML IJ SOLN
INTRAMUSCULAR | Status: DC | PRN
Start: 2021-02-02 — End: 2021-02-02
  Administered 2021-02-02: 2 mg via INTRAVENOUS

## 2021-02-02 MED ORDER — DEXAMETHASONE SODIUM PHOSPHATE 10 MG/ML IJ SOLN
INTRAMUSCULAR | Status: AC
  Filled 2021-02-02: qty 1

## 2021-02-02 MED ORDER — KETAMINE HCL 10 MG/ML IJ SOLN
INTRAMUSCULAR | Status: DC | PRN
  Administered 2021-02-02: 25 mg via INTRAVENOUS

## 2021-02-02 MED ORDER — TRAMADOL HCL 50 MG PO TABS: 50.0000 mg | ORAL_TABLET | Freq: Four times a day (QID) | ORAL | 0 refills | Status: DC | PRN

## 2021-02-02 MED ORDER — ACETAMINOPHEN 160 MG/5ML PO SOLN: 325.0000 mg | ORAL | Status: DC | PRN

## 2021-02-02 MED ORDER — BUPIVACAINE-EPINEPHRINE (PF) 0.25% -1:200000 IJ SOLN
INTRAMUSCULAR | Status: AC
  Filled 2021-02-02: qty 30

## 2021-02-02 MED ORDER — ROCURONIUM BROMIDE 10 MG/ML (PF) SYRINGE
PREFILLED_SYRINGE | INTRAVENOUS | Status: AC
  Filled 2021-02-02: qty 10

## 2021-02-02 MED ORDER — SCOPOLAMINE 1 MG/3DAYS TD PT72
1.0000 | MEDICATED_PATCH | TRANSDERMAL | Status: DC
  Administered 2021-02-02: 1.5 mg via TRANSDERMAL
  Filled 2021-02-02: qty 1

## 2021-02-02 MED ORDER — OXYCODONE HCL 5 MG PO TABS: 5.0000 mg | ORAL_TABLET | Freq: Once | ORAL | Status: DC | PRN

## 2021-02-02 MED ORDER — PROPOFOL 10 MG/ML IV BOLUS
INTRAVENOUS | Status: DC | PRN
  Administered 2021-02-02: 200 mg via INTRAVENOUS

## 2021-02-02 MED ORDER — DEXAMETHASONE SODIUM PHOSPHATE 10 MG/ML IJ SOLN
INTRAMUSCULAR | Status: DC | PRN
  Administered 2021-02-02: 4 mg via INTRAVENOUS

## 2021-02-02 MED ORDER — ACETAMINOPHEN 325 MG PO TABS: 325.0000 mg | ORAL_TABLET | ORAL | Status: DC | PRN

## 2021-02-02 MED ORDER — SUGAMMADEX SODIUM 200 MG/2ML IV SOLN
INTRAVENOUS | Status: DC | PRN
  Administered 2021-02-02: 130 mg via INTRAVENOUS

## 2021-02-02 MED ORDER — BUPIVACAINE-EPINEPHRINE 0.25% -1:200000 IJ SOLN
INTRAMUSCULAR | Status: DC | PRN
  Administered 2021-02-02: 20 mL

## 2021-02-02 MED ORDER — FENTANYL CITRATE PF 50 MCG/ML IJ SOSY: 25.0000 ug | PREFILLED_SYRINGE | INTRAMUSCULAR | Status: DC | PRN

## 2021-02-02 MED ORDER — MEPERIDINE HCL 50 MG/ML IJ SOLN
6.2500 mg | INTRAMUSCULAR | Status: DC | PRN
Start: 2021-02-02 — End: 2021-02-02

## 2021-02-02 MED ORDER — GLYCOPYRROLATE 0.2 MG/ML IJ SOLN
INTRAMUSCULAR | Status: DC | PRN
  Administered 2021-02-02 (×2): .1 mg via INTRAVENOUS

## 2021-02-02 MED ORDER — PHENYLEPHRINE 40 MCG/ML (10ML) SYRINGE FOR IV PUSH (FOR BLOOD PRESSURE SUPPORT)
PREFILLED_SYRINGE | INTRAVENOUS | Status: AC
  Filled 2021-02-02: qty 10

## 2021-02-02 MED ORDER — IOHEXOL 300 MG/ML  SOLN
100.0000 mL | Freq: Once | INTRAMUSCULAR | Status: AC | PRN
  Administered 2021-02-02: 100 mL via INTRAVENOUS

## 2021-02-02 MED ORDER — FENTANYL CITRATE (PF) 250 MCG/5ML IJ SOLN
INTRAMUSCULAR | Status: DC | PRN
  Administered 2021-02-02: 100 ug via INTRAVENOUS
  Administered 2021-02-02: 50 ug via INTRAVENOUS

## 2021-02-02 MED ORDER — LACTATED RINGERS IV SOLN: INTRAVENOUS | Status: DC

## 2021-02-02 SURGICAL SUPPLY — 32 items
ADH SKN CLS APL DERMABOND .7 (GAUZE/BANDAGES/DRESSINGS) ×1
APL PRP STRL LF DISP 70% ISPRP (MISCELLANEOUS) ×1
BAG SPEC RTRVL LRG 6X4 10 (ENDOMECHANICALS) ×1
CHLORAPREP W/TINT 26 (MISCELLANEOUS) ×2 IMPLANT
COVER SURGICAL LIGHT HANDLE (MISCELLANEOUS) ×2 IMPLANT
CUTTER FLEX LINEAR 45M (STAPLE) ×1 IMPLANT
DERMABOND ADVANCED (GAUZE/BANDAGES/DRESSINGS) ×1
DERMABOND ADVANCED .7 DNX12 (GAUZE/BANDAGES/DRESSINGS) ×1 IMPLANT
DRAPE LAPAROSCOPIC ABDOMINAL (DRAPES) ×2 IMPLANT
ELECT REM PT RETURN 15FT ADLT (MISCELLANEOUS) ×2 IMPLANT
GLOVE SURG ORTHO LTX SZ8 (GLOVE) ×2 IMPLANT
GLOVE SURG SYN 7.5  E (GLOVE) ×2
GLOVE SURG SYN 7.5 E (GLOVE) ×1 IMPLANT
GLOVE SURG SYN 7.5 PF PI (GLOVE) ×1 IMPLANT
GOWN STRL REUS W/TWL XL LVL3 (GOWN DISPOSABLE) ×4 IMPLANT
IRRIG SUCT STRYKERFLOW 2 WTIP (MISCELLANEOUS) ×2
IRRIGATION SUCT STRKRFLW 2 WTP (MISCELLANEOUS) ×1 IMPLANT
KIT BASIN OR (CUSTOM PROCEDURE TRAY) ×2 IMPLANT
KIT TURNOVER KIT A (KITS) ×1 IMPLANT
POUCH SPECIMEN RETRIEVAL 10MM (ENDOMECHANICALS) ×2 IMPLANT
RELOAD STAPLE 45 3.5 BLU ETS (ENDOMECHANICALS) IMPLANT
RELOAD STAPLE TA45 3.5 REG BLU (ENDOMECHANICALS) ×2 IMPLANT
SET TUBE SMOKE EVAC HIGH FLOW (TUBING) ×2 IMPLANT
SHEARS HARMONIC ACE PLUS 36CM (ENDOMECHANICALS) ×2 IMPLANT
STRIP CLOSURE SKIN 1/2X4 (GAUZE/BANDAGES/DRESSINGS) ×2 IMPLANT
SUT MNCRL AB 4-0 PS2 18 (SUTURE) ×2 IMPLANT
TOWEL OR 17X26 10 PK STRL BLUE (TOWEL DISPOSABLE) ×2 IMPLANT
TOWEL OR NON WOVEN STRL DISP B (DISPOSABLE) ×2 IMPLANT
TRAY LAPAROSCOPIC (CUSTOM PROCEDURE TRAY) ×2 IMPLANT
TROCAR BLADELESS OPT 5 100 (ENDOMECHANICALS) ×2 IMPLANT
TROCAR XCEL BLUNT TIP 100MML (ENDOMECHANICALS) ×2 IMPLANT
TROCAR XCEL NON-BLD 11X100MML (ENDOMECHANICALS) ×2 IMPLANT

## 2021-02-02 NOTE — ED Provider Notes (Addendum)
9:04 AM patient arrived from [facility] where she was diagnosed with acute appendicitis.  Transferred to see surgery and likely have an operation.  She received antibiotics prior to arrival.  Currently pain is controlled unless she is moving.  She is very tender in the right lower quadrant with guarding.  BP 116/76   Pulse 80   Temp 98 F (36.7 C)   Resp 16   Ht 5\' 1"  (1.549 m)   Wt 64.9 kg   SpO2 100%   BMI 27.02 kg/m   10:42 AM confirmed with general surgery that they are aware that patient is in the emergency department.  They will consult.   , PA-C 02/02/21 02/04/21    6759, DO 02/02/21 1004    02/04/21, PA-C 02/02/21 1042    02/04/21, DO 02/02/21 1208

## 2021-02-02 NOTE — ED Provider Notes (Signed)
[facility] EMERGENCY DEPARTMENT Provider Note   CSN: [id number] Arrival date & time: 02/01/21  2153     History Chief Complaint  Patient presents with   Abdominal Pain    [name] is a 40 y.o. female.  Patient is a 40 year old female with past medical history of GERD, migraines, asthma, and recent uterine ablation.  Patient presenting today with complaints of right lower quadrant pain.  This has been worsening over the past 2 days.  She denies any fevers or chills.  She denies any bowel or bladder complaints.  The history is provided by the patient.  Abdominal Pain Pain location:  RLQ Pain quality: cramping   Pain radiates to:  Does not radiate Pain severity:  Moderate Onset quality:  Gradual Duration:  2 days Timing:  Intermittent Progression:  Worsening     Past Medical History:  Diagnosis Date   Asthma    prn inhaler   Finger mass, right 03/2013   index finger   GERD (gastroesophageal reflux disease)    H/O hiatal hernia    History of esophageal stricture    multiple esophageal dilations - states x 12   Migraine    PONV (postoperative nausea and vomiting)    nausea only    Patient Active Problem List   Diagnosis Date Noted   Menorrhagia with regular cycle    DUB (dysfunctional uterine bleeding)    Dysmenorrhea    History of bilateral tubal ligation 09/21/2019   Preeclampsia, severe 09/20/2019   History of abnormal cervical Pap smear 08/07/2019   Previous cesarean section 07/09/2019   AMA (advanced maternal age) multigravida 35+ 07/09/2019   Pregnant state, gestational carrier 03/19/2019   Supervision of high risk pregnancy, antepartum 03/19/2019   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   Family history of colon cancer in father 05/17/2018   Anxiety and depression 11/30/2011   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   EE (eosinophilic esophagitis) 07/26/2011   Mild intermittent asthma 07/26/2011    ASTHMA 11/14/2008   GERD 11/14/2008   DYSPHAGIA UNSPECIFIED 11/14/2008    Past Surgical History:  Procedure Laterality Date   BALLOON DILATION  06/16/2011   Procedure: BALLOON DILATION;  Surgeon: Willis Modena, MD;  Location: [facility];  Service: Endoscopy;  Laterality: N/A;   BALLOON DILATION  07/07/2011   Procedure: BALLOON DILATION;  Surgeon: Willis Modena, MD;  Location: [facility];  Service: Endoscopy;  Laterality: N/A;   BALLOON DILATION  07/28/2011   Procedure: BALLOON DILATION;  Surgeon: Willis Modena, MD;  Location: [facility];  Service: Endoscopy;  Laterality: N/A;   CESAREAN SECTION  1999; 02/07/2003   CESAREAN SECTION WITH BILATERAL TUBAL LIGATION N/A 09/21/2019   Procedure: CESAREAN SECTION WITH BILATERAL TUBAL LIGATION;  Surgeon: Warden Fillers, MD;  Location: [facility];  Service: Obstetrics;  Laterality: N/A;   DILATATION AND CURETTAGE/HYSTEROSCOPY WITH MINERVA N/A 12/31/2020   Procedure: DILATATION AND CURETTAGE/HYSTEROSCOPY WITH MINERVA EDOMETRIAL ABLATION;  Surgeon: [name], MD;  Location: [facility];  Service: Gynecology;  Laterality: N/A;   ESOPHAGOGASTRODUODENOSCOPY  07/07/2011   ESOPHAGOGASTRODUODENOSCOPY  06/16/2011   Procedure: ESOPHAGOGASTRODUODENOSCOPY (EGD);  Surgeon: Willis Modena, MD;  Location: [facility];  Service: Endoscopy;  Laterality: N/A;   ESOPHAGOGASTRODUODENOSCOPY  07/28/2011   Procedure: ESOPHAGOGASTRODUODENOSCOPY (EGD);  Surgeon: Willis Modena, MD;  Location: [facility];  Service: Endoscopy;  Laterality: N/A;   ESOPHAGOGASTRODUODENOSCOPY (EGD) WITH PROPOFOL N/A 06/26/2014   Procedure: ESOPHAGOGASTRODUODENOSCOPY (EGD) WITH PROPOFOL;  Surgeon: Doreatha Martin  Evette Cristal, MD;  Location: [facility];  Service: Endoscopy;  Laterality: N/A;   FINGER MASS EXCISION Left    index finger   FOREIGN BODY REMOVAL N/A 06/26/2014   Procedure: FOREIGN BODY REMOVAL;  Surgeon: Wandalee Ferdinand, MD;  Location: [facility];  Service: Endoscopy;  Laterality: N/A;   MASS EXCISION  Right 04/16/2013   Procedure: RIGHT INDEX EXCISION MASS;  Surgeon: Tami Ribas, MD;  Location: [facility];  Service: Orthopedics;  Laterality: Right;   TUBAL LIGATION       OB History     Gravida  3   Para  3   Term  3   Preterm      AB      Living  3      SAB      IAB      Ectopic      Multiple  0   Live Births  3           Family History  Problem Relation Age of Onset   Anesthesia problems Mother        post-op nausea   Colon cancer Father     Social History   Tobacco Use   Smoking status: Never   Smokeless tobacco: Never  Vaping Use   Vaping Use: Never used  Substance Use Topics   Alcohol use: Yes    Comment: occ   Drug use: No    Home Medications Prior to Admission medications   Medication Sig Start Date End Date Taking? Authorizing Provider  amoxicillin (AMOXIL) 500 MG capsule Take 1 capsule (500 mg total) by mouth 2 (two) times daily for 7 days. 01/28/21 02/04/21  Gustavus Bryant, FNP  ondansetron (ZOFRAN ODT) 4 MG disintegrating tablet Take 1 tablet (4 mg total) by mouth every 8 (eight) hours as needed for nausea or vomiting. 12/29/20   Cheral Marker, CNM  ondansetron (ZOFRAN ODT) 8 MG disintegrating tablet Take 1 tablet (8 mg total) by mouth every 8 (eight) hours as needed for nausea or vomiting. 12/31/20   [name], MD    Allergies    Doxycycline, Codeine, Sucralfate, Red dye, and Augmentin [amoxicillin-pot clavulanate]  Review of Systems   Review of Systems  Gastrointestinal:  Positive for abdominal pain.  All other systems reviewed and are negative.  Physical Exam Updated Vital Signs BP 128/70   Pulse 69   Temp 97.8 F (36.6 C) (Oral)   Resp 18   Ht 5\' 1"  (1.549 m)   Wt 64.9 kg   SpO2 98%   BMI 27.02 kg/m   Physical Exam Vitals and nursing note reviewed.  Constitutional:      General: She is not in acute distress.    Appearance: She is well-developed. She is not diaphoretic.  HENT:     Head:  Normocephalic and atraumatic.  Cardiovascular:     Rate and Rhythm: Normal rate and regular rhythm.     Heart sounds: No murmur heard.   No friction rub. No gallop.  Pulmonary:     Effort: Pulmonary effort is normal. No respiratory distress.     Breath sounds: Normal breath sounds. No wheezing.  Abdominal:     General: Bowel sounds are normal. There is no distension.     Palpations: Abdomen is soft.     Tenderness: There is abdominal tenderness in the right lower quadrant. There is no right CVA tenderness, left CVA tenderness, guarding or rebound.  Musculoskeletal:  General: Normal range of motion.     Cervical back: Normal range of motion and neck supple.  Skin:    General: Skin is warm and dry.  Neurological:     General: No focal deficit present.     Mental Status: She is alert and oriented to person, place, and time.    ED Results / Procedures / Treatments   Labs (all labs ordered are listed, but only abnormal results are displayed) Labs Reviewed  COMPREHENSIVE METABOLIC PANEL - Abnormal; Notable for the following components:      Result Value   Sodium 134 (*)    CO2 21 (*)    All other components within normal limits  LIPASE, BLOOD  CBC  URINALYSIS, ROUTINE W REFLEX MICROSCOPIC  PREGNANCY, URINE    EKG None  Radiology No results found.  Procedures Procedures   Medications Ordered in ED Medications  sodium chloride 0.9 % bolus 1,000 mL (has no administration in time range)    ED Course  I have reviewed the triage vital signs and the nursing notes.  Pertinent labs & imaging results that were available during my care of the patient were reviewed by me and considered in my medical decision making (see chart for details).    MDM Rules/Calculators/A&P  Patient presenting with lower abdominal pain.  She has focal tenderness to the right lower quadrant, but no fever or white count.  CT scan does confirm acute appendicitis.  This was discussed with Dr.  Carolynne Edouard.  Patient to be transferred to the [facility], ER for surgical consultation and definitive care.  Rocephin and Flagyl initiated.  Final Clinical Impression(s) / ED Diagnoses Final diagnoses:  None    Rx / DC Orders ED Discharge Orders     None        Geoffery Lyons, MD 02/02/21 0425

## 2021-02-02 NOTE — Anesthesia Procedure Notes (Signed)
Procedure Name: Intubation Date/Time: 02/02/2021 12:43 PM Performed by: Eben Burow, CRNA Pre-anesthesia Checklist: Patient identified, Emergency Drugs available, Suction available, Patient being monitored and Timeout performed Patient Re-evaluated:Patient Re-evaluated prior to induction Oxygen Delivery Method: Circle system utilized Preoxygenation: Pre-oxygenation with 100% oxygen Induction Type: IV induction, Rapid sequence and Cricoid Pressure applied Laryngoscope Size: Mac and 4 Grade View: Grade I Tube type: Oral Tube size: 7.0 mm Number of attempts: 1 Airway Equipment and Method: Stylet Placement Confirmation: ETT inserted through vocal cords under direct vision, positive ETCO2 and breath sounds checked- equal and bilateral Secured at: 21 cm Tube secured with: Tape Dental Injury: Teeth and Oropharynx as per pre-operative assessment

## 2021-02-02 NOTE — H&P (Addendum)
[facility] Surgery Admission Note  [name] 09-13-1980  [id number].    Requesting MD: Judd Lien, MD Chief Complaint/Reason for Consult: acute appendicitis    HPI:  [name] is a 40 y/o F with a history of hiatial hernia s/p nissen and HH repair with mesh 2019 who presented to [facility] from [facility] on 02/02/21 after CT scan confirmed acute appendicitis.   Patient reports abdominal pain that started Friday 11/11 at 10PM after she and her son ate at [employer] 3 hours earlier. Pain described as constant, non-radiating, right-lower quadrant pain. Patient did not take any medicine for the pain. Pain is exacerbated by movement. Reports pain got progressively worse, making it hard for her to stand up and get in/out of her car. Assocaited sxs include mild nausea on Saturday, as well as decreased PO intake. She denies fever, chills, or changes in bowel habits. Denies tobacco, alcohol, or drug use. Currently employed at [employer]. At baseline she lives with her husband and child.   ROS: Review of Systems  Constitutional: Negative.   HENT: Negative.    Eyes: Negative.   Respiratory: Negative.    Cardiovascular: Negative.   Gastrointestinal:  Positive for abdominal pain and nausea.  Genitourinary: Negative.   Musculoskeletal: Negative.   Skin: Negative.   Neurological: Negative.   Endo/Heme/Allergies: Negative.   Psychiatric/Behavioral: Negative.     Family History  Problem Relation Age of Onset   Anesthesia problems Mother        post-op nausea   Colon cancer Father     Past Medical History:  Diagnosis Date   Asthma    prn inhaler   Finger mass, right 03/2013   index finger   GERD (gastroesophageal reflux disease)    H/O hiatal hernia    History of esophageal stricture    multiple esophageal dilations - states x 12   Migraine    PONV (postoperative nausea and vomiting)    nausea only    Past Surgical History:   Procedure Laterality Date   BALLOON DILATION  06/16/2011   Procedure: BALLOON DILATION;  Surgeon: Willis Modena, MD;  Location: [facility] ENDOSCOPY;  Service: Endoscopy;  Laterality: N/A;   BALLOON DILATION  07/07/2011   Procedure: BALLOON DILATION;  Surgeon: Willis Modena, MD;  Location: [facility];  Service: Endoscopy;  Laterality: N/A;   BALLOON DILATION  07/28/2011   Procedure: BALLOON DILATION;  Surgeon: Willis Modena, MD;  Location: [facility]OS[facility];  Service: Endoscopy;  Laterality: N/A;   CESAREAN SECTION  1999; 02/07/2003   CESAREAN SECTION WITH BILATERAL TUBAL LIGATION N/A 09/21/2019   Procedure: CESAREAN SECTION WITH BILATERAL TUBAL LIGATION;  Surgeon: Warden Fillers, MD;  Location: [facility] ORS;  Service: Obstetrics;  Laterality: N/A;   DILATATION AND CURETTAGE/HYSTEROSCOPY WITH MINERVA N/A 12/31/2020   Procedure: DILATATION AND CURETTAGE/HYSTEROSCOPY WITH MINERVA EDOMETRIAL ABLATION;  Surgeon: [name], MD;  Location: [facility];  Service: Gynecology;  Laterality: N/A;   ESOPHAGOGASTRODUODENOSCOPY  07/07/2011   ESOPHAGOGASTRODUODENOSCOPY  06/16/2011   Procedure: ESOPHAGOGASTRODUODENOSCOPY (EGD);  Surgeon: Willis Modena, MD;  Location: [facility];  Service: Endoscopy;  Laterality: N/A;   ESOPHAGOGASTRODUODENOSCOPY  07/28/2011   Procedure: ESOPHAGOGASTRODUODENOSCOPY (EGD);  Surgeon: Willis Modena, MD;  Location: [facility];  Service: Endoscopy;  Laterality: N/A;   ESOPHAGOGASTRODUODENOSCOPY (EGD) WITH PROPOFOL N/A 06/26/2014   Procedure: ESOPHAGOGASTRODUODENOSCOPY (EGD) WITH PROPOFOL;  Surgeon: Wandalee Ferdinand, MD;  Location: [facility] ENDOSCOPY;  Service: Endoscopy;  Laterality: N/A;   FINGER MASS EXCISION  Left    index finger   FOREIGN BODY REMOVAL N/A 06/26/2014   Procedure: FOREIGN BODY REMOVAL;  Surgeon: Wandalee Ferdinand, MD;  Location: [facility] ENDOSCOPY;  Service: Endoscopy;  Laterality: N/A;   MASS EXCISION Right 04/16/2013   Procedure: RIGHT INDEX EXCISION MASS;  Surgeon: Tami Ribas, MD;  Location: [facility];  Service: Orthopedics;  Laterality: Right;   TUBAL LIGATION      Social History:  reports that she has never smoked. She has never used smokeless tobacco. She reports current alcohol use. She reports that she does not use drugs.  Allergies:  Allergies  Allergen Reactions   Doxycycline Rash   Codeine Nausea And Vomiting   Sucralfate Nausea And Vomiting   Red Dye Other (See Comments)    dizziness   Augmentin [Amoxicillin-Pot Clavulanate] Rash    Pt states she recently took amoxicillin without incident, but cannot take the combination in Augmentin.   Has patient had a PCN reaction causing immediate rash, facial/tongue/throat swelling, SOB or lightheadedness with hypotension: No Has patient had a PCN reaction causing severe rash involving mucus membranes or skin necrosis: No Has patient had a PCN reaction that required hospitalization No Has patient had a PCN reaction occurring within the last 10 years: No If all of the above answers are "NO    (Not in a hospital admission)   Blood pressure 128/74, pulse 72, temperature 98 F (36.7 C), resp. rate 18, height 5\' 1"  (1.549 m), weight 64.9 kg, SpO2 99 %, not currently breastfeeding. Physical Exam: Constitutional: NAD; conversant; no deformities Eyes: Moist conjunctiva; no lid lag; anicteric; PERRL Neck: Trachea midline; no thyromegaly Lungs: Normal respiratory effort; no tactile fremitus CV: RRR; no palpable thrills; no pitting edema GI: Abd soft, TTP RLQ with guarding; +Rovsing sign, no palpable hepatosplenomegaly MSK: symmetrical extremities; no clubbing/cyanosis Psychiatric: Appropriate affect; alert and oriented x3 Lymphatic: No palpable cervical or axillary lymphadenopathy  Results for orders placed or performed during the hospital encounter of 02/02/21 (from the past 48 hour(s))  Lipase, blood     Status: None   Collection Time: 02/01/21 10:20 PM  Result Value Ref Range   Lipase 27 11 - 51 U/L     Comment: Performed at [facility], [address] [place], [place]  Comprehensive metabolic panel     Status: Abnormal   Collection Time: 02/01/21 10:20 PM  Result Value Ref Range   Sodium 134 (L) 135 - 145 mmol/L   Potassium 3.6 3.5 - 5.1 mmol/L   Chloride 103 98 - 111 mmol/L   CO2 21 (L) 22 - 32 mmol/L   Glucose, Bld 99 70 - 99 mg/dL    Comment: Glucose reference range applies only to samples taken after fasting for at least 8 hours.   BUN 13 6 - 20 mg/dL   Creatinine, Ser 02/03/21 0.44 - 1.00 mg/dL   Calcium 9.0 8.9 - 9.38 mg/dL   Total Protein 7.1 6.5 - 8.1 g/dL   Albumin 3.8 3.5 - 5.0 g/dL   AST 20 15 - 41 U/L   ALT 23 0 - 44 U/L   Alkaline Phosphatase 84 38 - 126 U/L   Total Bilirubin 0.7 0.3 - 1.2 mg/dL   GFR, Estimated 10.1 >75 mL/min    Comment: (NOTE) Calculated using the CKD-EPI Creatinine Equation (2021)    Anion gap 10 5 - 15    Comment: Performed at [facility], [address] [place],  [place] [address]  CBC     Status: None   Collection Time: 02/01/21 10:20 PM  Result Value Ref Range   WBC 8.6 4.0 - 10.5 K/uL   RBC 4.33 3.87 - 5.11 MIL/uL   Hemoglobin 12.9 12.0 - 15.0 g/dL   HCT 78.9 38.1 - 01.7 %   MCV 88.5 80.0 - 100.0 fL   MCH 29.8 26.0 - 34.0 pg   MCHC 33.7 30.0 - 36.0 g/dL   RDW 51.0 25.8 - 52.7 %   Platelets 316 150 - 400 K/uL   nRBC 0.0 0.0 - 0.2 %    Comment: Performed at [facility], [address] [place], [place] [address]  Urinalysis, Routine w reflex microscopic     Status: None   Collection Time: 02/02/21 12:23 AM  Result Value Ref Range   Color, Urine YELLOW YELLOW   APPearance CLEAR CLEAR   Specific Gravity, Urine 1.020 1.005 - 1.030   pH 7.0 5.0 - 8.0   Glucose, UA NEGATIVE NEGATIVE mg/dL   Hgb urine dipstick NEGATIVE NEGATIVE   Bilirubin Urine NEGATIVE NEGATIVE   Ketones, ur NEGATIVE NEGATIVE mg/dL   Protein, ur NEGATIVE NEGATIVE mg/dL   Nitrite NEGATIVE NEGATIVE   Leukocytes,Ua NEGATIVE  NEGATIVE    Comment: Microscopic not done on urines with negative protein, blood, leukocytes, nitrite, or glucose < 500 mg/dL. Performed at [facility], [address] [place], [place] [address]   Pregnancy, urine     Status: None   Collection Time: 02/02/21 12:23 AM  Result Value Ref Range   Preg Test, Ur NEGATIVE NEGATIVE    Comment:        THE SENSITIVITY OF THIS METHODOLOGY IS >20 mIU/mL. Performed at [facility], [address] [place], [place] [address]   Resp Panel by RT-PCR (Flu A&B, Covid) Nasopharyngeal Swab     Status: None   Collection Time: 02/02/21  6:30 AM   Specimen: Nasopharyngeal Swab; Nasopharyngeal(NP) swabs in vial transport medium  Result Value Ref Range   SARS Coronavirus 2 by RT PCR NEGATIVE NEGATIVE    Comment: (NOTE) SARS-CoV-2 target nucleic acids are NOT DETECTED.  The SARS-CoV-2 RNA is generally detectable in upper respiratory specimens during the acute phase of infection. The lowest concentration of SARS-CoV-2 viral copies this assay can detect is 138 copies/mL. A negative result does not preclude SARS-Cov-2 infection and should not be used as the sole basis for treatment or other patient management decisions. A negative result may occur with  improper specimen collection/handling, submission of specimen other than nasopharyngeal swab, presence of viral mutation(s) within the areas targeted by this assay, and inadequate number of viral copies(<138 copies/mL). A negative result must be combined with clinical observations, patient history, and epidemiological information. The expected result is Negative.  Fact Sheet for Patients:  BloggerCourse.com  Fact Sheet for Healthcare Providers:  SeriousBroker.it  This test is no t yet approved or cleared by the [place] FDA and  has been authorized for detection and/or diagnosis of SARS-CoV-2 by FDA under an Emergency Use  Authorization (EUA). This EUA will remain  in effect (meaning this test can be used) for the duration of the COVID-19 declaration under Section 564(b)(1) of the Act, 21 U.S.C.section 360bbb-3(b)(1), unless the authorization is terminated  or revoked sooner.       Influenza A by PCR NEGATIVE NEGATIVE   Influenza B by PCR NEGATIVE NEGATIVE    Comment: (NOTE) The Xpert Xpress  SARS-CoV-2/FLU/RSV plus assay is intended as an aid in the diagnosis of influenza from Nasopharyngeal swab specimens and should not be used as a sole basis for treatment. Nasal washings and aspirates are unacceptable for Xpert Xpress SARS-CoV-2/FLU/RSV testing.  Fact Sheet for Patients: BloggerCourse.com  Fact Sheet for Healthcare Providers: SeriousBroker.it  This test is not yet approved or cleared by the [place] FDA and has been authorized for detection and/or diagnosis of SARS-CoV-2 by FDA under an Emergency Use Authorization (EUA). This EUA will remain in effect (meaning this test can be used) for the duration of the COVID-19 declaration under Section 564(b)(1) of the Act, 21 U.S.C. section 360bbb-3(b)(1), unless the authorization is terminated or revoked.  Performed at [facility], [address] [place], [place] [address]    CT ABDOMEN PELVIS W CONTRAST  Result Date: 02/02/2021 CLINICAL DATA:  Right lower quadrant abdominal pain EXAM: CT ABDOMEN AND PELVIS WITH CONTRAST TECHNIQUE: Multidetector CT imaging of the abdomen and pelvis was performed using the standard protocol following bolus administration of intravenous contrast. CONTRAST:  OMNIPAQUE IOHEXOL 300 MG/ML  SOLN COMPARISON:  None. FINDINGS: Lower chest: The distal esophagus is patulous and debris-filled with abrupt narrowing at the gastroesophageal junction and possible surgical changes of fundoplication. The visualized lung bases are clear. The visualized heart and  pericardium are unremarkable. Hepatobiliary: No focal liver abnormality is seen. No gallstones, gallbladder wall thickening, or biliary dilatation. Pancreas: Unremarkable Spleen: Unremarkable Adrenals/Urinary Tract: Adrenal glands are unremarkable. Kidneys are normal, without renal calculi, focal lesion, or hydronephrosis. Bladder is unremarkable. Stomach/Bowel: The appendix is dilated, hyperemic, and demonstrates periappendiceal inflammatory stranding in keeping with changes of acute, unruptured appendicitis. The stomach, small bowel, and large bowel are otherwise unremarkable. No evidence of obstruction. No free intraperitoneal gas or fluid. Vascular/Lymphatic: No significant vascular findings are present. No enlarged abdominal or pelvic lymph nodes. Reproductive: 2.5 cm simple appearing cyst noted within the left ovary. The pelvic organs are otherwise unremarkable. Other: No abdominal wall hernia.  Rectum unremarkable. Musculoskeletal: No acute bone abnormality. No lytic or blastic bone lesion. IMPRESSION: Acute, unruptured appendicitis. Appendix: Location: Retrocecal within the midline pelvis Diameter: 9 mm Appendicolith: None Mucosal hyperenhancement: Present Extraluminal gas: None Periappendical collection: None Patulous, debris-filled distal esophagus with abrupt narrowing of the gastroesophageal junction suggesting a stricture at this level. Possible superimposed surgical changes of fundoplication. Correlation with the patient's surgical history is recommended. 2.5 cm left ovarian simple-appearing cyst. No follow-up imaging is recommended. Reference: JACR 2020 Feb;17(2):248-254 Electronically Signed   By: Helyn Numbers M.D.   On: 02/02/2021 03:14    Assessment/Plan Acute appendicitis, uncomplicated  Afebrile, vitals are stable, WBC WNL. Clinical history, exam, and CT scan consistent with acute appendicitis. Recommend laparoscopic appendectomy.   The risks of surgery including bleeding, infection,  conversion to open, damage to surrounding structures, post-operative infection, drain placement, and need for additional procedures were discussed with the patient and she would like to proceed with surgery.  Anticipate hospital discharge later today vs tomorrow if surgery is uncomplicated.    Adam Phenix, PA-C [facility] Please see Amion for pager number during day hours 7:00am-4:30pm 02/02/2021, 11:10 AM

## 2021-02-02 NOTE — Anesthesia Postprocedure Evaluation (Signed)
Anesthesia Post Note  Patient: [name]  Procedure(s) Performed: APPENDECTOMY LAPAROSCOPIC     Patient location during evaluation: PACU Anesthesia Type: General Level of consciousness: awake and alert Pain management: pain level controlled Vital Signs Assessment: post-procedure vital signs reviewed and stable Respiratory status: spontaneous breathing, nonlabored ventilation, respiratory function stable and patient connected to nasal cannula oxygen Cardiovascular status: blood pressure returned to baseline and stable Postop Assessment: no apparent nausea or vomiting Anesthetic complications: no   No notable events documented.  Last Vitals:  Vitals:   02/02/21 1045 02/02/21 1345  BP: 127/78 (!) 143/76  Pulse: 87 84  Resp: 15 16  Temp:  36.4 C  SpO2: 100% 96%    Last Pain:  Vitals:   02/02/21 1345  TempSrc:   PainSc: 0-No pain                 Ranada Vigorito

## 2021-02-02 NOTE — Op Note (Signed)
OPERATIVE REPORT - LAPAROSCOPIC APPENDECTOMY  Preop diagnosis:  Acute appendicitis  Postop diagnosis:  same  Procedure:  Laparoscopic appendectomy  Surgeon:  Darnell Level, MD  Anesthesia:  general endotracheal  Estimated blood loss:  minimal  Preparation:  Chlora-prep  Complications:  none  Indications:  Patient is a 40 yo female who presents with signs and symptoms of acute appendicitis.  Procedure:  Patient was brought to the operating room and placed in a supine position on the operating room table. Following administration of general anesthesia, a time out was held and the patient's name and procedure was confirmed. Patient was then prepped and draped in the usual strict aseptic fashion.  After ascertaining that an adequate level of anesthesia had been achieved, a peri-umbilical incision was made with a #15 blade. Dissection was carried down to the fascia. Fascia was incised in the midline and the peritoneal cavity was entered cautiously. A #0-vicryl pursestring suture was placed in the fascia. An Hassan cannula was introduced under direct vision and secured with the pursestring suture. The abdomen was insufflated with carbon dioxide. The laparoscope was introduced and the abdomen was explored. Operative ports were placed in the right upper quadrant and left lower quadrant.  The omentum is mobilized off of the appendix.  The appendix is elevated with a Glassman clamp.  The mesoappendix is then carefully divided using the harmonic scalpel.  Dissection is carried down to the base of the appendix.  Good hemostasis is noted.  The base of the appendix is then transected using an Endo GIA stapler.  Staple line is inspected for hemostasis.  The appendix was placed into an endo-catch bag and withdrawn through the umbilical port. The #0-vicryl pursestring suture was tied securely.  Right lower quadrant was irrigated with warm saline which was evacuated. Good hemostasis was noted. Ports were  removed under direct vision. Good hemostasis was noted at the port sites. Pneumoperitoneum was released.  Skin incisions were anesthetized with local anesthetic. Wounds were closed with interrupted 4-0 Monocryl subcuticular sutures. Wounds were washed and dried and Dermabond was applied. The patient was awakened from anesthesia and brought to the recovery room. The patient tolerated the procedure well.  Darnell Level, MD [facility] Surgery Office: [phone]

## 2021-02-02 NOTE — Anesthesia Preprocedure Evaluation (Addendum)
Anesthesia Evaluation  Patient identified by MRN, date of birth, ID band Patient awake    Reviewed: Allergy & Precautions, NPO status , Patient's Chart, lab work & pertinent test results  History of Anesthesia Complications (+) PONV and history of anesthetic complications  Airway Mallampati: II  TM Distance: >3 FB Neck ROM: Full    Dental  (+) Dental Advisory Given, Chipped, Missing, Poor Dentition, Edentulous Upper,    Pulmonary asthma ,    Pulmonary exam normal breath sounds clear to auscultation       Cardiovascular Exercise Tolerance: Good hypertension, Pt. on medications Normal cardiovascular exam Rhythm:Regular Rate:Normal     Neuro/Psych  Headaches, PSYCHIATRIC DISORDERS Anxiety Depression    GI/Hepatic Neg liver ROS, hiatal hernia, GERD  Medicated and Controlled,  Endo/Other  negative endocrine ROS  Renal/GU negative Renal ROS     Musculoskeletal negative musculoskeletal ROS (+)   Abdominal   Peds  Hematology negative hematology ROS (+)   Anesthesia Other Findings   Reproductive/Obstetrics negative OB ROS                            Anesthesia Physical  Anesthesia Plan  ASA: 3 and emergent  Anesthesia Plan: General   Post-op Pain Management:    Induction: Intravenous and Cricoid pressure planned  PONV Risk Score and Plan: 4 or greater and Ondansetron, Dexamethasone, Midazolam and Scopolamine patch - Pre-op  Airway Management Planned: Oral ETT  Additional Equipment: None  Intra-op Plan:   Post-operative Plan: Extubation in OR  Informed Consent: I have reviewed the patients History and Physical, chart, labs and discussed the procedure including the risks, benefits and alternatives for the proposed anesthesia with the patient or authorized representative who has indicated his/her understanding and acceptance.     Dental advisory given  Plan Discussed with: CRNA,  Surgeon and Anesthesiologist  Anesthesia Plan Comments:        Anesthesia Quick Evaluation

## 2021-02-02 NOTE — Transfer of Care (Signed)
Immediate Anesthesia Transfer of Care Note  Patient: [name]  Procedure(s) Performed: APPENDECTOMY LAPAROSCOPIC  Patient Location: PACU  Anesthesia Type:General  Level of Consciousness: awake, alert  and patient cooperative  Airway & Oxygen Therapy: Patient Spontanous Breathing and Patient connected to face mask oxygen  Post-op Assessment: Report given to RN and Post -op Vital signs reviewed and stable  Post vital signs: Reviewed and stable  Last Vitals:  Vitals Value Taken Time  BP 150/83 02/02/21 1340  Temp    Pulse 95 02/02/21 1341  Resp 17 02/02/21 1341  SpO2 99 % 02/02/21 1341  Vitals shown include unvalidated device data.  Last Pain:  Vitals:   02/02/21 1206  TempSrc:   PainSc: 0-No pain      Patients Stated Pain Goal: 4 (02/02/21 1206)  Complications: No notable events documented.

## 2021-02-03 ENCOUNTER — Encounter (HOSPITAL_COMMUNITY): Payer: Self-pay | Admitting: Surgery

## 2021-02-03 LAB — SURGICAL PATHOLOGY

## 2021-03-05 NOTE — Addendum Note (Signed)
Addendum  created 03/05/21 1449 by Bethena Midget, MD   Intraprocedure Staff edited

## 2021-10-22 ENCOUNTER — Other Ambulatory Visit: Payer: Self-pay | Admitting: Obstetrics & Gynecology

## 2021-10-22 DIAGNOSIS — Z1231 Encounter for screening mammogram for malignant neoplasm of breast: Secondary | ICD-10-CM

## 2022-01-04 ENCOUNTER — Ambulatory Visit
Admission: RE | Admit: 2022-01-04 | Discharge: 2022-01-04 | Disposition: A | Discharge status: Home / Self Care | Source: Ambulatory Visit | Attending: Obstetrics & Gynecology | Admitting: Obstetrics & Gynecology

## 2022-01-04 DIAGNOSIS — Z1231 Encounter for screening mammogram for malignant neoplasm of breast: Secondary | ICD-10-CM

## 2022-01-18 ENCOUNTER — Encounter: Payer: Self-pay | Admitting: Obstetrics & Gynecology

## 2022-01-20 MED ORDER — FLUCONAZOLE 150 MG PO TABS: ORAL_TABLET | ORAL | 1 refills | Status: DC

## 2022-03-09 ENCOUNTER — Ambulatory Visit
Admission: EM | Admit: 2022-03-09 | Discharge: 2022-03-09 | Disposition: A | Discharge status: Home / Self Care | Attending: Physician Assistant | Admitting: Physician Assistant

## 2022-03-09 DIAGNOSIS — Z20822 Contact with and (suspected) exposure to covid-19: Secondary | ICD-10-CM | POA: Insufficient documentation

## 2022-03-09 DIAGNOSIS — J45901 Unspecified asthma with (acute) exacerbation: Secondary | ICD-10-CM | POA: Insufficient documentation

## 2022-03-09 DIAGNOSIS — J069 Acute upper respiratory infection, unspecified: Secondary | ICD-10-CM | POA: Insufficient documentation

## 2022-03-09 LAB — RESP PANEL BY RT-PCR (FLU A&B, COVID) ARPGX2
Influenza A by PCR: POSITIVE — AB
Influenza B by PCR: NEGATIVE
SARS Coronavirus 2 by RT PCR: NEGATIVE

## 2022-03-09 MED ORDER — PREDNISONE 20 MG PO TABS: 40.0000 mg | ORAL_TABLET | Freq: Every day | ORAL | 0 refills | Status: AC

## 2022-03-09 MED ORDER — AZITHROMYCIN 250 MG PO TABS: 250.0000 mg | ORAL_TABLET | Freq: Every day | ORAL | 0 refills | Status: DC

## 2022-03-09 NOTE — ED Triage Notes (Signed)
Pt presents with cough and congestion for past few days.

## 2022-03-09 NOTE — ED Provider Notes (Signed)
[name]    CSN: [id number] Arrival date & time: 03/09/22  1541      History   Chief Complaint Chief Complaint  Patient presents with   URI    HPI [name] is a 41 y.o. female.   Here today for evaluation of cough and congestion she has had the last few days.  She reports that she feels as if her asthma has been affected and she has had more shortness of breath and wheezing despite using her albuterol inhaler more frequently.  She states that typically she needs Z-Pak to improve asthma symptoms.  She does work with the [employer] system and states she has had exposure to flu and other upper respiratory infections and her son recently had COVID as well.  Patient has not had fever.  The history is provided by the patient.  URI Presenting symptoms: congestion, cough and sore throat   Presenting symptoms: no ear pain and no fever   Associated symptoms: wheezing     Past Medical History:  Diagnosis Date   Asthma    prn inhaler   Finger mass, right 03/2013   index finger   GERD (gastroesophageal reflux disease)    H/O hiatal hernia    History of esophageal stricture    multiple esophageal dilations - states x 12   Migraine    PONV (postoperative nausea and vomiting)    nausea only    Patient Active Problem List   Diagnosis Date Noted   Menorrhagia with regular cycle    DUB (dysfunctional uterine bleeding)    Dysmenorrhea    History of bilateral tubal ligation 09/21/2019   Preeclampsia, severe 09/20/2019   History of abnormal cervical Pap smear 08/07/2019   Previous cesarean section 07/09/2019   AMA (advanced maternal age) multigravida 35+ 07/09/2019   Pregnant state, gestational carrier 03/19/2019   Supervision of high risk pregnancy, antepartum 03/19/2019   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   Family history of colon cancer in father 05/17/2018   Anxiety and depression 11/30/2011   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   EE (eosinophilic esophagitis) 07/26/2011   Mild intermittent asthma 07/26/2011   ASTHMA 11/14/2008   GERD 11/14/2008   DYSPHAGIA UNSPECIFIED 11/14/2008    Past Surgical History:  Procedure Laterality Date   BALLOON DILATION  06/16/2011   Procedure: BALLOON DILATION;  Surgeon: Willis Modena, MD;  Location: [facility];  Service: Endoscopy;  Laterality: N/A;   BALLOON DILATION  07/07/2011   Procedure: BALLOON DILATION;  Surgeon: Willis Modena, MD;  Location: [facility];  Service: Endoscopy;  Laterality: N/A;   BALLOON DILATION  07/28/2011   Procedure: BALLOON DILATION;  Surgeon: Willis Modena, MD;  Location: [facility];  Service: Endoscopy;  Laterality: N/A;   CESAREAN SECTION  1999; 02/07/2003   CESAREAN SECTION WITH BILATERAL TUBAL LIGATION N/A 09/21/2019   Procedure: CESAREAN SECTION WITH BILATERAL TUBAL LIGATION;  Surgeon: Warden Fillers, MD;  Location: [facility];  Service: Obstetrics;  Laterality: N/A;   DILATATION AND CURETTAGE/HYSTEROSCOPY WITH MINERVA N/A 12/31/2020   Procedure: DILATATION AND CURETTAGE/HYSTEROSCOPY WITH MINERVA EDOMETRIAL ABLATION;  Surgeon: [name], MD;  Location: [facility];  Service: Gynecology;  Laterality: N/A;   ESOPHAGOGASTRODUODENOSCOPY  07/07/2011   ESOPHAGOGASTRODUODENOSCOPY  06/16/2011   Procedure: ESOPHAGOGASTRODUODENOSCOPY (EGD);  Surgeon: Willis Modena, MD;  Location: [facility];  Service: Endoscopy;  Laterality: N/A;   ESOPHAGOGASTRODUODENOSCOPY  07/28/2011   Procedure: ESOPHAGOGASTRODUODENOSCOPY (EGD);  Surgeon: Chrissie Noa  Dulce Sellar, MD;  Location: [facility];  Service: Endoscopy;  Laterality: N/A;   ESOPHAGOGASTRODUODENOSCOPY (EGD) WITH PROPOFOL N/A 06/26/2014   Procedure: ESOPHAGOGASTRODUODENOSCOPY (EGD) WITH PROPOFOL;  Surgeon: Wandalee Ferdinand, MD;  Location: [facility];  Service: Endoscopy;  Laterality: N/A;   FINGER MASS EXCISION Left    index finger   FOREIGN BODY REMOVAL N/A 06/26/2014   Procedure: FOREIGN BODY REMOVAL;   Surgeon: Wandalee Ferdinand, MD;  Location: [facility];  Service: Endoscopy;  Laterality: N/A;   LAPAROSCOPIC APPENDECTOMY N/A 02/02/2021   Procedure: APPENDECTOMY LAPAROSCOPIC;  Surgeon: Darnell Level, MD;  Location: [facility];  Service: General;  Laterality: N/A;   MASS EXCISION Right 04/16/2013   Procedure: RIGHT INDEX EXCISION MASS;  Surgeon: Tami Ribas, MD;  Location: [facility];  Service: Orthopedics;  Laterality: Right;   TUBAL LIGATION      OB History     Gravida  3   Para  3   Term  3   Preterm      AB      Living  3      SAB      IAB      Ectopic      Multiple  0   Live Births  3            Home Medications    Prior to Admission medications   Medication Sig Start Date End Date Taking? Authorizing Provider  azithromycin (ZITHROMAX) 250 MG tablet Take 1 tablet (250 mg total) by mouth daily. Take first 2 tablets together, then 1 every day until finished. 03/09/22  Yes Tomi Bamberger, PA-C  predniSONE (DELTASONE) 20 MG tablet Take 2 tablets (40 mg total) by mouth daily with breakfast for 5 days. 03/09/22 03/14/22 Yes Tomi Bamberger, PA-C  fluconazole (DIFLUCAN) 150 MG tablet Take 1 tablet today and repeat in 3 days 01/20/22   [name], MD  ondansetron (ZOFRAN ODT) 4 MG disintegrating tablet Take 1 tablet (4 mg total) by mouth every 8 (eight) hours as needed for nausea or vomiting. 12/29/20   Cheral Marker, CNM  ondansetron (ZOFRAN ODT) 8 MG disintegrating tablet Take 1 tablet (8 mg total) by mouth every 8 (eight) hours as needed for nausea or vomiting. 12/31/20   [name], MD  traMADol (ULTRAM) 50 MG tablet Take 1-2 tablets (50-100 mg total) by mouth every 6 (six) hours as needed for moderate pain. 02/02/21   Darnell Level, MD    Family History Family History  Problem Relation Age of Onset   Anesthesia problems Mother        post-op nausea   Colon cancer Father    Breast cancer Neg Hx     Social History Social History    Tobacco Use   Smoking status: Never   Smokeless tobacco: Never  Vaping Use   Vaping Use: Never used  Substance Use Topics   Alcohol use: Yes    Comment: occ   Drug use: No     Allergies   Doxycycline, Codeine, Sucralfate, Red dye, and Augmentin [amoxicillin-pot clavulanate]   Review of Systems Review of Systems  Constitutional:  Negative for chills and fever.  HENT:  Positive for congestion and sore throat. Negative for ear pain.   Eyes:  Negative for discharge and redness.  Respiratory:  Positive for cough, shortness of breath and wheezing.   Gastrointestinal:  Negative for abdominal pain, diarrhea, nausea and vomiting.     Physical Exam Triage Vital Signs  ED Triage Vitals [03/09/22 1551]  Enc Vitals Group     BP      Pulse      Resp      Temp      Temp src      SpO2      Weight      Height      Head Circumference      Peak Flow      Pain Score 2     Pain Loc      Pain Edu?      Excl. in GC?    No data found.  Updated Vital Signs BP (!) 140/84   Pulse 72   Temp 97.8 F (36.6 C)   Resp 16   SpO2 98%      Physical Exam Vitals and nursing note reviewed.  Constitutional:      General: She is not in acute distress.    Appearance: Normal appearance. She is not ill-appearing.  HENT:     Head: Normocephalic and atraumatic.     Nose: Congestion present.     Mouth/Throat:     Mouth: Mucous membranes are moist.     Pharynx: No oropharyngeal exudate or posterior oropharyngeal erythema.  Eyes:     Conjunctiva/sclera: Conjunctivae normal.  Cardiovascular:     Rate and Rhythm: Normal rate and regular rhythm.     Heart sounds: Normal heart sounds. No murmur heard. Pulmonary:     Effort: Pulmonary effort is normal. No respiratory distress.     Breath sounds: Wheezing (mild scattered) present. No rhonchi or rales.  Skin:    General: Skin is warm and dry.  Neurological:     Mental Status: She is alert.  Psychiatric:        Mood and Affect: Mood  normal.        Thought Content: Thought content normal.      UC Treatments / Results  Labs (all labs ordered are listed, but only abnormal results are displayed) Labs Reviewed  RESP PANEL BY RT-PCR (FLU A&B, COVID) [id number]    EKG   Radiology No results found.  Procedures Procedures (including critical care time)  Medications Ordered in UC Medications - No data to display  Initial Impression / Assessment and Plan / UC Course  I have reviewed the triage vital signs and the nursing notes.  Pertinent labs & imaging results that were available during my care of the patient were reviewed by me and considered in my medical decision making (see chart for details).    Suspect asthma exacerbation.  Will screen for viral illness.  Steroid burst and Zithromax prescribed.  Encouraged follow-up if symptoms fail to improve or worsen. Patient expresses understanding.   Final Clinical Impressions(s) / UC Diagnoses   Final diagnoses:  Acute upper respiratory infection  Exposure to COVID-19 virus  Asthma with acute exacerbation, unspecified asthma severity, unspecified whether persistent   Discharge Instructions   None    ED Prescriptions     Medication Sig Dispense Auth. Provider   predniSONE (DELTASONE) 20 MG tablet Take 2 tablets (40 mg total) by mouth daily with breakfast for 5 days. 10 tablet Erma Pinto F, PA-C   azithromycin (ZITHROMAX) 250 MG tablet Take 1 tablet (250 mg total) by mouth daily. Take first 2 tablets together, then 1 every day until finished. 6 tablet Tomi Bamberger, PA-C      PDMP not reviewed this encounter.   Tomi Bamberger, PA-C 03/09/22 1736

## 2022-11-01 ENCOUNTER — Other Ambulatory Visit: Payer: Self-pay | Admitting: Obstetrics & Gynecology

## 2022-11-01 DIAGNOSIS — Z1231 Encounter for screening mammogram for malignant neoplasm of breast: Secondary | ICD-10-CM

## 2023-01-06 ENCOUNTER — Ambulatory Visit
Admission: RE | Admit: 2023-01-06 | Discharge: 2023-01-06 | Disposition: A | Discharge status: Home / Self Care | Source: Ambulatory Visit | Attending: Obstetrics & Gynecology | Admitting: Obstetrics & Gynecology

## 2023-01-06 DIAGNOSIS — Z1231 Encounter for screening mammogram for malignant neoplasm of breast: Secondary | ICD-10-CM

## 2023-11-21 HISTORY — PX: REDUCTION MAMMAPLASTY: SUR839

## 2023-11-23 NOTE — H&P (Signed)
 Subjective Patient ID: [name] is a 43 y.o. female.     HPI   Returns for follow up discussion breast reduction. Current 36 DD. Reports several year history back and shoulder pain and reports burning sensation between shoulder blades. Reports associated frequent HA. Patient has tried specialty fitted bra, OTC pain medication, regular activity/exercise, hot/cold packs for over 3 month trial without effect. She has also tried steroid injections to back. Reports numbness hands but relates this to her Raynauds.   Wt stable.   MMG 12/2022 Benign. Denies FH breast or ovarian ca.   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 Notes several other adults in room with her. Lives with spouse and 48 yo child. Has two other adult children.   PMH includes SCC excision scalp   Review of Systems  Endocrine: Positive for cold intolerance.  Musculoskeletal:  Positive for arthralgias, back pain and neck pain.  Neurological:  Positive for headaches.  Hematological:  Bruises/bleeds easily.  Psychiatric/Behavioral:  The patient is nervous/anxious.   All other systems reviewed and are negative.    Objective Physical Exam  Cardiovascular: Normal rate, regular rhythm and normal heart sounds.    Pulmonary/Chest Effort normal and breath sounds normal.    Skin   Fitzpatrick 2 very tan     Lymph: no palpable axillary adenopathy   +shoulder grooving Breasts: no palpable masses, grade 3 ptosis bilateral SN to nipple R 31 L 29.5 cm BW R 21 L 20 cm Nipple to IMF R 9.5 L 9.5 cm   Assessment/Plan Diagnoses and all orders for this visit:   Macromastia Chronic neck and back pain HA   Needs to be off all nicotine for 4-6 w prior to surgery.  Chronic neck and back pain, HA, in setting of macromastia that has failed conservative management over 3 month trial. The  pain is not related to other diagnoses.There is a reasonable likelihood that the patient's symptoms are primarily due to macromastia. Breast reduction is likely to result in improvement of the chronic pain.  Reviewed reduction with anchor type scars, OP surgery, drains, post operative visits and limitations, recovery. Diminished sensation nipple and breast skin, risk of nipple loss, wound healing problems, asymmetry, incidental carcinoma, changes with wt gain/loss, aging, unacceptable cosmetic appearance reviewed. Reviewed cannot assure cup size.    Additional risks including but not limited to bleeding, seroma, hematoma, damage to adjacent structures, infection, asymmetry, damage to adjacent structures, need for additional procedures, unacceptable cosmetic result, blood clots in legs or lungs reviewed. Completed ASPS consent for breast reduction.   Drain teaching completed. Rx for tramadol  given.  Anticipate 350 g reduction from each breast.  Allison Ranks, MD [facility] Plastic & Reconstructive Surgery  Office/ physician access line after hours [phone]

## 2023-11-24 ENCOUNTER — Encounter (HOSPITAL_BASED_OUTPATIENT_CLINIC_OR_DEPARTMENT_OTHER): Payer: Self-pay | Admitting: Plastic Surgery

## 2023-11-24 ENCOUNTER — Other Ambulatory Visit: Payer: Self-pay

## 2023-11-28 ENCOUNTER — Encounter (HOSPITAL_BASED_OUTPATIENT_CLINIC_OR_DEPARTMENT_OTHER)
Admission: RE | Admit: 2023-11-28 | Discharge: 2023-11-28 | Disposition: A | Discharge status: Home / Self Care | Source: Ambulatory Visit | Attending: Plastic Surgery | Admitting: Plastic Surgery

## 2023-11-28 DIAGNOSIS — Z01818 Encounter for other preprocedural examination: Secondary | ICD-10-CM | POA: Insufficient documentation

## 2023-11-28 MED ORDER — CHLORHEXIDINE GLUCONATE CLOTH 2 % EX PADS: 6.0000 | MEDICATED_PAD | Freq: Once | CUTANEOUS | Status: DC

## 2023-11-28 NOTE — Progress Notes (Signed)

## 2023-12-02 ENCOUNTER — Encounter (HOSPITAL_BASED_OUTPATIENT_CLINIC_OR_DEPARTMENT_OTHER)
Admission: RE | Disposition: A | Discharge status: Home / Self Care | Payer: Self-pay | Source: Home / Self Care | Attending: Plastic Surgery

## 2023-12-02 ENCOUNTER — Other Ambulatory Visit: Payer: Self-pay

## 2023-12-02 ENCOUNTER — Encounter (HOSPITAL_BASED_OUTPATIENT_CLINIC_OR_DEPARTMENT_OTHER): Payer: Self-pay | Admitting: Plastic Surgery

## 2023-12-02 ENCOUNTER — Ambulatory Visit (HOSPITAL_BASED_OUTPATIENT_CLINIC_OR_DEPARTMENT_OTHER): Admitting: Certified Registered"

## 2023-12-02 ENCOUNTER — Ambulatory Visit (HOSPITAL_BASED_OUTPATIENT_CLINIC_OR_DEPARTMENT_OTHER)
Admission: RE | Admit: 2023-12-02 | Discharge: 2023-12-02 | Disposition: A | Discharge status: Home / Self Care | Attending: Plastic Surgery | Admitting: Plastic Surgery

## 2023-12-02 DIAGNOSIS — Z87891 Personal history of nicotine dependence: Secondary | ICD-10-CM | POA: Insufficient documentation

## 2023-12-02 DIAGNOSIS — G8929 Other chronic pain: Secondary | ICD-10-CM | POA: Insufficient documentation

## 2023-12-02 DIAGNOSIS — K449 Diaphragmatic hernia without obstruction or gangrene: Secondary | ICD-10-CM | POA: Insufficient documentation

## 2023-12-02 DIAGNOSIS — R519 Headache, unspecified: Secondary | ICD-10-CM | POA: Insufficient documentation

## 2023-12-02 DIAGNOSIS — K219 Gastro-esophageal reflux disease without esophagitis: Secondary | ICD-10-CM | POA: Diagnosis not present

## 2023-12-02 DIAGNOSIS — Z79899 Other long term (current) drug therapy: Secondary | ICD-10-CM | POA: Diagnosis not present

## 2023-12-02 DIAGNOSIS — M542 Cervicalgia: Secondary | ICD-10-CM | POA: Insufficient documentation

## 2023-12-02 DIAGNOSIS — I10 Essential (primary) hypertension: Secondary | ICD-10-CM | POA: Insufficient documentation

## 2023-12-02 DIAGNOSIS — N62 Hypertrophy of breast: Secondary | ICD-10-CM

## 2023-12-02 DIAGNOSIS — I7300 Raynaud's syndrome without gangrene: Secondary | ICD-10-CM | POA: Insufficient documentation

## 2023-12-02 DIAGNOSIS — M549 Dorsalgia, unspecified: Secondary | ICD-10-CM | POA: Insufficient documentation

## 2023-12-02 DIAGNOSIS — Z01818 Encounter for other preprocedural examination: Secondary | ICD-10-CM

## 2023-12-02 HISTORY — PX: BREAST REDUCTION SURGERY: SHX8

## 2023-12-02 HISTORY — DX: Essential (primary) hypertension: I10

## 2023-12-02 LAB — POCT PREGNANCY, URINE: Preg Test, Ur: NEGATIVE

## 2023-12-02 SURGERY — MAMMOPLASTY, REDUCTION
Anesthesia: General | Site: Breast | Laterality: Bilateral

## 2023-12-02 MED ORDER — ROCURONIUM BROMIDE 100 MG/10ML IV SOLN
INTRAVENOUS | Status: DC | PRN
  Administered 2023-12-02: 10 mg via INTRAVENOUS
  Administered 2023-12-02: 60 mg via INTRAVENOUS

## 2023-12-02 MED ORDER — AMISULPRIDE (ANTIEMETIC) 5 MG/2ML IV SOLN
INTRAVENOUS | Status: AC
  Filled 2023-12-02: qty 4

## 2023-12-02 MED ORDER — ONDANSETRON HCL 4 MG/2ML IJ SOLN
INTRAMUSCULAR | Status: DC | PRN
  Administered 2023-12-02: 4 mg via INTRAVENOUS

## 2023-12-02 MED ORDER — ONDANSETRON HCL 4 MG/2ML IJ SOLN
INTRAMUSCULAR | Status: AC
  Filled 2023-12-02: qty 2

## 2023-12-02 MED ORDER — BUPIVACAINE HCL (PF) 0.5 % IJ SOLN
INTRAMUSCULAR | Status: DC | PRN
  Administered 2023-12-02: 30 mL

## 2023-12-02 MED ORDER — ONDANSETRON 4 MG PO TBDP: 4.0000 mg | ORAL_TABLET | Freq: Three times a day (TID) | ORAL | 0 refills | Status: AC | PRN

## 2023-12-02 MED ORDER — PROPOFOL 10 MG/ML IV BOLUS
INTRAVENOUS | Status: AC
  Filled 2023-12-02: qty 20

## 2023-12-02 MED ORDER — PHENYLEPHRINE HCL (PRESSORS) 10 MG/ML IV SOLN
INTRAVENOUS | Status: DC | PRN
  Administered 2023-12-02 (×5): 80 ug via INTRAVENOUS

## 2023-12-02 MED ORDER — CELECOXIB 200 MG PO CAPS
200.0000 mg | ORAL_CAPSULE | ORAL | Status: AC
  Administered 2023-12-02: 200 mg via ORAL

## 2023-12-02 MED ORDER — CEFAZOLIN SODIUM-DEXTROSE 2-4 GM/100ML-% IV SOLN
2.0000 g | INTRAVENOUS | Status: AC
  Administered 2023-12-02: 2 g via INTRAVENOUS

## 2023-12-02 MED ORDER — MIDAZOLAM HCL 5 MG/5ML IJ SOLN
INTRAMUSCULAR | Status: DC | PRN
  Administered 2023-12-02: 2 mg via INTRAVENOUS

## 2023-12-02 MED ORDER — ACETAMINOPHEN 500 MG PO TABS
1000.0000 mg | ORAL_TABLET | ORAL | Status: AC
  Administered 2023-12-02: 1000 mg via ORAL

## 2023-12-02 MED ORDER — HYDROMORPHONE HCL 1 MG/ML IJ SOLN: 0.2500 mg | INTRAMUSCULAR | Status: DC | PRN

## 2023-12-02 MED ORDER — PROPOFOL 10 MG/ML IV BOLUS
INTRAVENOUS | Status: DC | PRN
Start: 2023-12-02 — End: 2023-12-02
  Administered 2023-12-02: 150 mg via INTRAVENOUS

## 2023-12-02 MED ORDER — FENTANYL CITRATE (PF) 100 MCG/2ML IJ SOLN
INTRAMUSCULAR | Status: DC | PRN
  Administered 2023-12-02: 25 ug via INTRAVENOUS
  Administered 2023-12-02: 50 ug via INTRAVENOUS
  Administered 2023-12-02: 25 ug via INTRAVENOUS
  Administered 2023-12-02: 100 ug via INTRAVENOUS

## 2023-12-02 MED ORDER — GABAPENTIN 300 MG PO CAPS
300.0000 mg | ORAL_CAPSULE | ORAL | Status: AC
  Administered 2023-12-02: 300 mg via ORAL

## 2023-12-02 MED ORDER — DEXAMETHASONE SODIUM PHOSPHATE 10 MG/ML IJ SOLN
INTRAMUSCULAR | Status: DC | PRN
  Administered 2023-12-02: 10 mg via INTRAVENOUS

## 2023-12-02 MED ORDER — FENTANYL CITRATE (PF) 100 MCG/2ML IJ SOLN
INTRAMUSCULAR | Status: AC
  Filled 2023-12-02: qty 2

## 2023-12-02 MED ORDER — LACTATED RINGERS IV SOLN: INTRAVENOUS | Status: DC

## 2023-12-02 MED ORDER — GABAPENTIN 300 MG PO CAPS
ORAL_CAPSULE | ORAL | Status: AC
  Filled 2023-12-02: qty 1

## 2023-12-02 MED ORDER — PHENYLEPHRINE 80 MCG/ML (10ML) SYRINGE FOR IV PUSH (FOR BLOOD PRESSURE SUPPORT)
PREFILLED_SYRINGE | INTRAVENOUS | Status: AC
  Filled 2023-12-02: qty 10

## 2023-12-02 MED ORDER — SUGAMMADEX SODIUM 200 MG/2ML IV SOLN
INTRAVENOUS | Status: DC | PRN
  Administered 2023-12-02: 200 mg via INTRAVENOUS

## 2023-12-02 MED ORDER — LIDOCAINE 2% (20 MG/ML) 5 ML SYRINGE
INTRAMUSCULAR | Status: DC | PRN
  Administered 2023-12-02: 60 mg via INTRAVENOUS

## 2023-12-02 MED ORDER — CEFAZOLIN SODIUM-DEXTROSE 2-4 GM/100ML-% IV SOLN
INTRAVENOUS | Status: AC
  Filled 2023-12-02: qty 100

## 2023-12-02 MED ORDER — DEXMEDETOMIDINE HCL IN NACL 80 MCG/20ML IV SOLN
INTRAVENOUS | Status: DC | PRN
  Administered 2023-12-02 (×2): 4 ug via INTRAVENOUS

## 2023-12-02 MED ORDER — SCOPOLAMINE 1 MG/3DAYS TD PT72
1.0000 | MEDICATED_PATCH | TRANSDERMAL | Status: DC
  Administered 2023-12-02: 1 mg via TRANSDERMAL

## 2023-12-02 MED ORDER — ACETAMINOPHEN 500 MG PO TABS
ORAL_TABLET | ORAL | Status: AC
  Filled 2023-12-02: qty 2

## 2023-12-02 MED ORDER — LIDOCAINE 2% (20 MG/ML) 5 ML SYRINGE
INTRAMUSCULAR | Status: AC
  Filled 2023-12-02: qty 5

## 2023-12-02 MED ORDER — MIDAZOLAM HCL 2 MG/2ML IJ SOLN
INTRAMUSCULAR | Status: AC
  Filled 2023-12-02: qty 2

## 2023-12-02 MED ORDER — DEXAMETHASONE SODIUM PHOSPHATE 10 MG/ML IJ SOLN
INTRAMUSCULAR | Status: AC
  Filled 2023-12-02: qty 1

## 2023-12-02 MED ORDER — SODIUM CHLORIDE 0.9 % IV SOLN: 12.5000 mg | INTRAVENOUS | Status: DC | PRN

## 2023-12-02 MED ORDER — MEPERIDINE HCL 25 MG/ML IJ SOLN: 6.2500 mg | INTRAMUSCULAR | Status: DC | PRN

## 2023-12-02 MED ORDER — SCOPOLAMINE 1 MG/3DAYS TD PT72
MEDICATED_PATCH | TRANSDERMAL | Status: AC
  Filled 2023-12-02: qty 1

## 2023-12-02 MED ORDER — 0.9 % SODIUM CHLORIDE (POUR BTL) OPTIME
TOPICAL | Status: DC | PRN
  Administered 2023-12-02: 200 mL

## 2023-12-02 MED ORDER — PHENYLEPHRINE HCL-NACL 20-0.9 MG/250ML-% IV SOLN
INTRAVENOUS | Status: DC | PRN
  Administered 2023-12-02: 25 ug/min via INTRAVENOUS

## 2023-12-02 MED ORDER — AMISULPRIDE (ANTIEMETIC) 5 MG/2ML IV SOLN
10.0000 mg | Freq: Once | INTRAVENOUS | Status: AC | PRN
  Administered 2023-12-02: 10 mg via INTRAVENOUS

## 2023-12-02 MED ORDER — CELECOXIB 200 MG PO CAPS
ORAL_CAPSULE | ORAL | Status: AC
Start: 2023-12-02 — End: 2023-12-02
  Filled 2023-12-02: qty 1

## 2023-12-02 SURGICAL SUPPLY — 44 items
BINDER BREAST 3XL (GAUZE/BANDAGES/DRESSINGS) IMPLANT
BINDER BREAST LRG (GAUZE/BANDAGES/DRESSINGS) IMPLANT
BINDER BREAST MEDIUM (GAUZE/BANDAGES/DRESSINGS) IMPLANT
BINDER BREAST XLRG (GAUZE/BANDAGES/DRESSINGS) IMPLANT
BINDER BREAST XXLRG (GAUZE/BANDAGES/DRESSINGS) IMPLANT
BLADE SURG 10 STRL SS (BLADE) ×4 IMPLANT
BNDG GAUZE DERMACEA FLUFF 4 (GAUZE/BANDAGES/DRESSINGS) ×2 IMPLANT
CANISTER SUCT 1200ML W/VALVE (MISCELLANEOUS) ×1 IMPLANT
CHLORAPREP W/TINT 26 (MISCELLANEOUS) ×2 IMPLANT
COVER BACK TABLE 60X90IN (DRAPES) ×1 IMPLANT
COVER MAYO STAND STRL (DRAPES) ×1 IMPLANT
DERMABOND ADVANCED .7 DNX12 (GAUZE/BANDAGES/DRESSINGS) ×2 IMPLANT
DRAIN CHANNEL 15F RND FF W/TCR (WOUND CARE) IMPLANT
DRAIN CHANNEL 19F RND (DRAIN) IMPLANT
DRAPE TOP ARMCOVERS (MISCELLANEOUS) ×1 IMPLANT
DRAPE U-SHAPE 76X120 STRL (DRAPES) ×1 IMPLANT
DRAPE UTILITY XL STRL (DRAPES) ×1 IMPLANT
ELECT COATED BLADE 2.86 ST (ELECTRODE) ×1 IMPLANT
ELECTRODE REM PT RTRN 9FT ADLT (ELECTROSURGICAL) ×1 IMPLANT
EVACUATOR SILICONE 100CC (DRAIN) IMPLANT
GAUZE PAD ABD 8X10 STRL (GAUZE/BANDAGES/DRESSINGS) ×2 IMPLANT
GLOVE BIO SURGEON STRL SZ 6 (GLOVE) ×2 IMPLANT
GOWN STRL REUS W/ TWL LRG LVL3 (GOWN DISPOSABLE) ×2 IMPLANT
MARKER SKIN DUAL TIP RULER LAB (MISCELLANEOUS) IMPLANT
NDL HYPO 25X1 1.5 SAFETY (NEEDLE) ×1 IMPLANT
NEEDLE HYPO 25X1 1.5 SAFETY (NEEDLE) ×1 IMPLANT
NS IRRIG 1000ML POUR BTL (IV SOLUTION) ×1 IMPLANT
PACK BASIN DAY SURGERY FS (CUSTOM PROCEDURE TRAY) ×1 IMPLANT
PENCIL SMOKE EVACUATOR (MISCELLANEOUS) ×1 IMPLANT
PIN SAFETY STERILE (MISCELLANEOUS) ×1 IMPLANT
SHEET MEDIUM DRAPE 40X70 STRL (DRAPES) ×1 IMPLANT
SLEEVE SCD COMPRESS KNEE MED (STOCKING) ×1 IMPLANT
SPONGE T-LAP 18X18 ~~LOC~~+RFID (SPONGE) ×3 IMPLANT
STAPLER SKIN PROX WIDE 3.9 (STAPLE) ×1 IMPLANT
SUT ETHILON 2 0 FS 18 (SUTURE) IMPLANT
SUT MNCRL AB 3-0 PS2 18 (SUTURE) IMPLANT
SUT MNCRL AB 4-0 PS2 18 (SUTURE) IMPLANT
SUT VIC AB 3-0 PS1 18XBRD (SUTURE) IMPLANT
SYR BULB IRRIG 60ML STRL (SYRINGE) ×1 IMPLANT
SYR CONTROL 10ML LL (SYRINGE) ×1 IMPLANT
TOWEL GREEN STERILE FF (TOWEL DISPOSABLE) ×2 IMPLANT
TUBE CONNECTING 20X1/4 (TUBING) ×1 IMPLANT
UNDERPAD 30X36 HEAVY ABSORB (UNDERPADS AND DIAPERS) ×2 IMPLANT
YANKAUER SUCT BULB TIP NO VENT (SUCTIONS) ×1 IMPLANT

## 2023-12-02 NOTE — Anesthesia Procedure Notes (Signed)
 Procedure Name: Intubation Date/Time: 12/02/2023 10:41 AM  Performed by: Debarah Chiquita LABOR, CRNAPre-anesthesia Checklist: Patient identified, Emergency Drugs available, Suction available and Patient being monitored Patient Re-evaluated:Patient Re-evaluated prior to induction Oxygen Delivery Method: Circle system utilized Preoxygenation: Pre-oxygenation with 100% oxygen Induction Type: IV induction Ventilation: Mask ventilation without difficulty and Oral airway inserted - appropriate to patient size Laryngoscope Size: Mac and 3 Grade View: Grade I Tube type: Oral Tube size: 7.0 mm Number of attempts: 1 Airway Equipment and Method: Stylet, Bite block and Oral airway Placement Confirmation: ETT inserted through vocal cords under direct vision, positive ETCO2 and breath sounds checked- equal and bilateral Secured at: 21 cm Tube secured with: Tape Dental Injury: Teeth and Oropharynx as per pre-operative assessment

## 2023-12-02 NOTE — Op Note (Signed)
 Operative Note   DATE OF OPERATION: 9.12.2025  LOCATION: [facility]-outpatient  SURGICAL DIVISION: Plastic Surgery  PREOPERATIVE DIAGNOSES:  1. Macromastia 2. Chronic neck and back pain 3. Headaches  POSTOPERATIVE DIAGNOSES:  same  PROCEDURE:  Bilateral breast reduction  SURGEON: [name] MD MBA  ASSISTANT: none  ANESTHESIA:  General.   EBL: 75 ml  COMPLICATIONS: None immediate.   INDICATIONS FOR PROCEDURE:  The patient, [name], is a 43 y.o. female born on April 04, 1980, is here for treatment chronic neck and back pain, headaches, in setting of macromastia that has failed conservative measures.   FINDINGS: Right reduction 374 g Left reduction 369 g  DESCRIPTION OF PROCEDURE:  The patient was marked standing in the preoperative area to mark sternal notch, chest midline, anterior axillary lines, inframammary folds. The location of new nipple areolar complex was marked at level of on inframammary fold on anterior surface breast by palpation. This was marked symmetric over bilateral breasts. With aid of Wise pattern marker, location of new nipple areolar complex and vertical limbs (6 cm) were marked by displacement of breasts along meridian. The patient was taken to the operating room. SCDs were placed and IV antibiotics were given. The patient's operative site was prepped and draped in a sterile fashion. A time out was performed and all information was confirmed to be correct.     I began on left breast. Over left breast, superior medial pedicle marked and nipple areolar complex incised with 45 diameter marker. Pedicle deepithlialized and developed to chest wall. Pedicle developed to chest wall. Breast tissue resected over lower pole. Medial and lateral flaps developed. Additional superior and lateral breast tissue excised. Breast tailor tacked closed.   I then directed attention to right breast where superior medial pedicle designed. NAC incised with 45 diameter  marker. The pedicle was deepithelialized. Pedicle developed to chest wall. Breast tissue resected over lower pole. Medial and lateral flaps developed. Additional superior and lateral breast tissue excised. Breast tailor tacked closed. Patient assessed for symmetry. Breast cavities irrigated and hemostasis obtained. Local anesthetic infiltrated throughout each breast. 15 Fr JP placed in each breast and secured with 2-0 nylon. Closure completed bilateral with interrupted and short running 3-0 vicryl used to approximate dermis along remainder inframammary fold and vertical limb. NAC inset with 3-0 vicryl in dermis. Skin closure completed with 4-0 monocryl subcuticular throughout vertical limbs and NAC. Skin closure completed with 3-0 monocryl along each IMF. Tissue adhesive applied. Dry dressing and breast binder applied.   The patient was allowed to wake from anesthesia, extubated and taken to the recovery room in satisfactory condition.   SPECIMENS: right and left breast reduction  DRAINS: 15 Fr JP in right and left breast  [name], MD [facility] Plastic & Reconstructive Surgery  Office/ physician access line after hours [phone]

## 2023-12-02 NOTE — Anesthesia Postprocedure Evaluation (Signed)
 Anesthesia Post Note  Patient: [name]  Procedure(s) Performed: MAMMOPLASTY, REDUCTION (Bilateral: Breast)     Patient location during evaluation: PACU Anesthesia Type: General Level of consciousness: awake and alert Pain management: pain level controlled Vital Signs Assessment: post-procedure vital signs reviewed and stable Respiratory status: spontaneous breathing, nonlabored ventilation, respiratory function stable and patient connected to nasal cannula oxygen Cardiovascular status: blood pressure returned to baseline and stable Postop Assessment: no apparent nausea or vomiting Anesthetic complications: no   No notable events documented.  Last Vitals:  Vitals:   12/02/23 1430 12/02/23 1447  BP: 133/85 (!) 158/84  Pulse: 80 66  Resp: (!) 22 18  Temp:  (!) 36.3 C  SpO2: 96% 97%    Last Pain:  Vitals:   12/02/23 1447  TempSrc:   PainSc: 8                  [name]

## 2023-12-02 NOTE — Discharge Instructions (Addendum)
 No tylenol  or motrin  until 4:15   Post Anesthesia Home Care Instructions  Activity: Get plenty of rest for the remainder of the day. A responsible individual must stay with you for 24 hours following the procedure.  For the next 24 hours, DO NOT: -Drive a car -Advertising copywriter -Drink alcoholic beverages -Take any medication unless instructed by your physician -Make any legal decisions or sign important papers.  Meals: Start with liquid foods such as gelatin or soup. Progress to regular foods as tolerated. Avoid greasy, spicy, heavy foods. If nausea and/or vomiting occur, drink only clear liquids until the nausea and/or vomiting subsides. Call your physician if vomiting continues.  Special Instructions/Symptoms: Your throat may feel dry or sore from the anesthesia or the breathing tube placed in your throat during surgery. If this causes discomfort, gargle with warm salt water. The discomfort should disappear within 24 hours.  If you had a scopolamine  patch placed behind your ear for the management of post- operative nausea and/or vomiting:  1. The medication in the patch is effective for 72 hours, after which it should be removed.  Wrap patch in a tissue and discard in the trash. Wash hands thoroughly with soap and water. 2. You may remove the patch earlier than 72 hours if you experience unpleasant side effects which may include dry mouth, dizziness or visual disturbances. 3. Avoid touching the patch. Wash your hands with soap and water after contact with the patch.    About my Jackson-Pratt Bulb Drain  What is a Jackson-Pratt bulb? A Jackson-Pratt is a soft, round device used to collect drainage. It is connected to a long, thin drainage catheter, which is held in place by one or two small stiches near your surgical incision site. When the bulb is squeezed, it forms a vacuum, forcing the drainage to empty into the bulb.  Emptying the Jackson-Pratt bulb- To empty the bulb: 1.  Release the plug on the top of the bulb. 2. Pour the bulb's contents into a measuring container which your nurse will provide. 3. Record the time emptied and amount of drainage. Empty the drain(s) as often as your     doctor or nurse recommends.  Date                  Time                    Amount (Drain 1)                 Amount (Drain 2)  _____________________________________________________________________  _____________________________________________________________________  _____________________________________________________________________  _____________________________________________________________________  _____________________________________________________________________  _____________________________________________________________________  _____________________________________________________________________  _____________________________________________________________________  Squeezing the Jackson-Pratt Bulb- To squeeze the bulb: 1. Make sure the plug at the top of the bulb is open. 2. Squeeze the bulb tightly in your fist. You will hear air squeezing from the bulb. 3. Replace the plug while the bulb is squeezed. 4. Use a safety pin to attach the bulb to your clothing. This will keep the catheter from     pulling at the bulb insertion site.  When to call your doctor- Call your doctor if: Drain site becomes red, swollen or hot. You have a fever greater than 101 degrees F. There is oozing at the drain site. Drain falls out (apply a guaze bandage over the drain hole and secure it with tape). Drainage increases daily not related to activity patterns. (You will usually have more drainage when you are active than when you are resting.) Drainage  has a bad odor.

## 2023-12-02 NOTE — Interval H&P Note (Signed)
 History and Physical Interval Note:  12/02/2023 9:50 AM  [name]  has presented today for surgery, with the diagnosis of MACROMASTIA.  The various methods of treatment have been discussed with the patient and family. After consideration of risks, benefits and other options for treatment, the patient has consented to  Procedure(s): MAMMOPLASTY, REDUCTION (Bilateral) as a surgical intervention.  The patient's history has been reviewed, patient examined, no change in status, stable for surgery.  I have reviewed the patient's chart and labs.  Questions were answered to the patient's satisfaction.     [name]

## 2023-12-02 NOTE — Transfer of Care (Signed)
 Immediate Anesthesia Transfer of Care Note  Patient: [name]  Procedure(s) Performed: MAMMOPLASTY, REDUCTION (Bilateral: Breast)  Patient Location: PACU  Anesthesia Type:General  Level of Consciousness: drowsy and patient cooperative  Airway & Oxygen Therapy: Patient Spontanous Breathing and Patient connected to face mask oxygen  Post-op Assessment: Report given to RN and Post -op Vital signs reviewed and stable  Post vital signs: Reviewed and stable  Last Vitals:  Vitals Value Taken Time  BP 114/62   Temp    Pulse 78   Resp 14   SpO2 99%     Last Pain:  Vitals:   12/02/23 1009  TempSrc: Temporal  PainSc: 0-No pain      Patients Stated Pain Goal: 3 (12/02/23 1009)  Complications: No notable events documented.

## 2023-12-02 NOTE — Anesthesia Preprocedure Evaluation (Addendum)
 Anesthesia Evaluation  Patient identified by MRN, date of birth, ID band Patient awake    Reviewed: Allergy & Precautions, H&P , NPO status , Patient's Chart, lab work & pertinent test results  History of Anesthesia Complications (+) PONV and history of anesthetic complications  Airway Mallampati: II  TM Distance: >3 FB Neck ROM: Full    Dental  (+) Dental Advisory Given, Chipped, Missing, Poor Dentition, Edentulous Upper,    Pulmonary neg pulmonary ROS, asthma    Pulmonary exam normal breath sounds clear to auscultation       Cardiovascular Exercise Tolerance: Good hypertension, Pt. on medications negative cardio ROS Normal cardiovascular exam Rhythm:Regular Rate:Normal     Neuro/Psych  Headaches PSYCHIATRIC DISORDERS Anxiety Depression    negative neurological ROS  negative psych ROS   GI/Hepatic negative GI ROS, Neg liver ROS, hiatal hernia,GERD  Medicated and Controlled,,  Endo/Other  negative endocrine ROS    Renal/GU negative Renal ROS  negative genitourinary   Musculoskeletal negative musculoskeletal ROS (+)    Abdominal   Peds negative pediatric ROS (+)  Hematology negative hematology ROS (+)   Anesthesia Other Findings   Reproductive/Obstetrics negative OB ROS                              Anesthesia Physical Anesthesia Plan  ASA: 2  Anesthesia Plan: General   Post-op Pain Management: Tylenol  PO (pre-op)*, Celebrex  PO (pre-op)* and Gabapentin  PO (pre-op)*   Induction: Intravenous  PONV Risk Score and Plan: 4 or greater and Ondansetron , Dexamethasone , Midazolam , Scopolamine  patch - Pre-op and Treatment may vary due to age or medical condition  Airway Management Planned: Oral ETT  Additional Equipment: None  Intra-op Plan:   Post-operative Plan: Extubation in OR  Informed Consent: I have reviewed the patients History and Physical, chart, labs and discussed the  procedure including the risks, benefits and alternatives for the proposed anesthesia with the patient or authorized representative who has indicated his/her understanding and acceptance.     Dental advisory given  Plan Discussed with: CRNA, Surgeon and Anesthesiologist  Anesthesia Plan Comments:          Anesthesia Quick Evaluation

## 2023-12-03 ENCOUNTER — Encounter (HOSPITAL_BASED_OUTPATIENT_CLINIC_OR_DEPARTMENT_OTHER): Payer: Self-pay | Admitting: Plastic Surgery

## 2023-12-05 LAB — SURGICAL PATHOLOGY

## 2024-03-19 ENCOUNTER — Other Ambulatory Visit: Payer: Self-pay | Admitting: Nurse Practitioner

## 2024-03-19 DIAGNOSIS — Z1231 Encounter for screening mammogram for malignant neoplasm of breast: Secondary | ICD-10-CM

## 2024-03-20 ENCOUNTER — Other Ambulatory Visit: Payer: Self-pay | Admitting: Plastic Surgery

## 2024-03-20 ENCOUNTER — Encounter: Payer: Self-pay | Admitting: Plastic Surgery

## 2024-03-20 DIAGNOSIS — N6310 Unspecified lump in the right breast, unspecified quadrant: Secondary | ICD-10-CM

## 2024-03-29 ENCOUNTER — Ambulatory Visit
Admission: RE | Admit: 2024-03-29 | Discharge: 2024-03-29 | Disposition: A | Discharge status: Home / Self Care | Source: Ambulatory Visit | Attending: Plastic Surgery | Admitting: Plastic Surgery

## 2024-03-29 DIAGNOSIS — N6310 Unspecified lump in the right breast, unspecified quadrant: Secondary | ICD-10-CM
